# Patient Record
Sex: Female | Born: 1985 | Hispanic: Yes | Marital: Married | Smoking: Former smoker
Health system: Southern US, Community
[De-identification: ages and names within clinical notes are randomized; demographics above are authoritative.]

## PROBLEM LIST (undated history)

## (undated) ENCOUNTER — Inpatient Hospital Stay (HOSPITAL_COMMUNITY): Payer: Self-pay

## (undated) DIAGNOSIS — O4593 Premature separation of placenta, unspecified, third trimester: Secondary | ICD-10-CM

## (undated) DIAGNOSIS — D649 Anemia, unspecified: Secondary | ICD-10-CM

## (undated) DIAGNOSIS — Z9289 Personal history of other medical treatment: Secondary | ICD-10-CM

## (undated) DIAGNOSIS — O165 Unspecified maternal hypertension, complicating the puerperium: Secondary | ICD-10-CM

## (undated) DIAGNOSIS — O24919 Unspecified diabetes mellitus in pregnancy, unspecified trimester: Secondary | ICD-10-CM

## (undated) DIAGNOSIS — R739 Hyperglycemia, unspecified: Secondary | ICD-10-CM

## (undated) DIAGNOSIS — G43909 Migraine, unspecified, not intractable, without status migrainosus: Secondary | ICD-10-CM

## (undated) HISTORY — PX: NO PAST SURGERIES: SHX2092

## (undated) HISTORY — DX: Anemia, unspecified: D64.9

## (undated) HISTORY — DX: Unspecified maternal hypertension, complicating the puerperium: O16.5

## (undated) HISTORY — DX: Premature separation of placenta, unspecified, third trimester: O45.93

## (undated) HISTORY — DX: Unspecified diabetes mellitus in pregnancy, unspecified trimester: O24.919

---
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
## 2004-01-13 ENCOUNTER — Ambulatory Visit: Payer: Self-pay | Admitting: Sports Medicine

## 2004-04-13 ENCOUNTER — Ambulatory Visit: Payer: Self-pay | Admitting: Sports Medicine

## 2004-08-13 ENCOUNTER — Ambulatory Visit: Payer: Self-pay | Admitting: Family Medicine

## 2005-02-09 ENCOUNTER — Ambulatory Visit: Payer: Self-pay | Admitting: Family Medicine

## 2005-07-28 ENCOUNTER — Ambulatory Visit: Payer: Self-pay | Admitting: Family Medicine

## 2005-08-17 ENCOUNTER — Emergency Department (HOSPITAL_COMMUNITY): Admission: EM | Admit: 2005-08-17 | Discharge: 2005-08-17 | Payer: Self-pay | Admitting: Emergency Medicine

## 2006-04-27 DIAGNOSIS — G43909 Migraine, unspecified, not intractable, without status migrainosus: Secondary | ICD-10-CM | POA: Insufficient documentation

## 2006-04-27 DIAGNOSIS — D649 Anemia, unspecified: Secondary | ICD-10-CM

## 2006-04-28 ENCOUNTER — Encounter (INDEPENDENT_AMBULATORY_CARE_PROVIDER_SITE_OTHER): Payer: Self-pay | Admitting: *Deleted

## 2007-11-26 ENCOUNTER — Ambulatory Visit: Payer: Self-pay | Admitting: Family Medicine

## 2007-11-26 ENCOUNTER — Encounter: Payer: Self-pay | Admitting: *Deleted

## 2007-12-16 ENCOUNTER — Emergency Department (HOSPITAL_COMMUNITY): Admission: EM | Admit: 2007-12-16 | Discharge: 2007-12-16 | Payer: Self-pay | Admitting: Family Medicine

## 2008-08-18 ENCOUNTER — Telehealth: Payer: Self-pay | Admitting: *Deleted

## 2010-02-28 NOTE — L&D Delivery Note (Signed)
Delivery Note At 1:44 AM a viable female was delivered via Vaginal, Spontaneous Delivery (Presentation: Left Occiput Anterior).  APGAR:8 ,9 ; weight 3845g .   Placenta status: Intact, Spontaneous.  Cord: 3 vessels with the following complications: None.  Cord pH: n/a  Anesthesia: None  Episiotomy: None Lacerations: 1st degree labial Est. Blood Loss (mL): 300  Mom to postpartum.  Baby to nursery-stable.  Drucie Ip 01/19/2011, 2:06 AM

## 2010-03-15 ENCOUNTER — Emergency Department (HOSPITAL_COMMUNITY)
Admission: EM | Admit: 2010-03-15 | Discharge: 2010-03-15 | Payer: Self-pay | Source: Home / Self Care | Admitting: Emergency Medicine

## 2010-03-23 ENCOUNTER — Ambulatory Visit: Admit: 2010-03-23 | Payer: Self-pay

## 2010-04-12 ENCOUNTER — Encounter: Payer: Self-pay | Admitting: Family Medicine

## 2010-04-12 ENCOUNTER — Ambulatory Visit (INDEPENDENT_AMBULATORY_CARE_PROVIDER_SITE_OTHER): Payer: Self-pay | Admitting: Family Medicine

## 2010-04-12 ENCOUNTER — Other Ambulatory Visit: Payer: Self-pay | Admitting: Family Medicine

## 2010-04-12 VITALS — BP 120/90 | HR 68 | Temp 97.9°F | Wt 260.7 lb

## 2010-04-12 DIAGNOSIS — Z124 Encounter for screening for malignant neoplasm of cervix: Secondary | ICD-10-CM

## 2010-04-12 DIAGNOSIS — N760 Acute vaginitis: Secondary | ICD-10-CM

## 2010-04-12 DIAGNOSIS — Z319 Encounter for procreative management, unspecified: Secondary | ICD-10-CM

## 2010-04-12 DIAGNOSIS — E669 Obesity, unspecified: Secondary | ICD-10-CM

## 2010-04-12 LAB — LDL CHOLESTEROL, DIRECT: Direct LDL: 129 mg/dL — ABNORMAL HIGH

## 2010-04-12 NOTE — Assessment & Plan Note (Signed)
Pap done

## 2010-04-12 NOTE — Patient Instructions (Addendum)
Nice to meet you I want to get some labs today to see if there is any reason for why you are having trouble getting pregnant. I am so glad you stopped smoking keep it up Other things that may help would be weight loss, stopping alcohol and even caffeine I wan to see you again in 2 months if you still are having trouble Start a prenatal vitamin.  I will call you with the lab results.

## 2010-04-12 NOTE — Progress Notes (Signed)
  Subjective:    Patient ID: Kathy Sandoval, female    DOB: 1985-11-16, 25 y.o.   MRN: [id number]  HPI Pt is here for physical exam only problem is   1.  Infertility-  Pt states she had Implanon removed about 1 year ago and had her first period one month after she had it removed.  Pt states her period has been normal during this time every 28 days and usually last 4-5 days.  Pt denies any pain or any change in periods or in between periods. Pt has been pregnant before and her daughter is now 70 yo no D&C.  2.  Fatigue-  states she has been more tired recently no hair loss no swelling no weight changes but states that her mother was recently diagnosed with hyperthyroid and would like to know if she is at risk.   3.  Obesity-  Pt states she is happy with her side and has not made any recent attempt to lose weight.  Pt though states that if weight loss helps with her infertility then would consider doing it.   Review of Systems Denies shortness of breath dyspnea on exertion dysuria polydipsia or chest pain.     Objective:   Physical Exam     General Appearance:    Alert, cooperative, no distress, appears stated age obese  Head:    Normocephalic, without obvious abnormality, atraumatic  Eyes:    PERRL, conjunctiva/corneas clear, EOM's intact,   Ears:    Normal TM's and external ear canals, both ears  Nose:   Nares normal, septum midline, mucosa normal, no drainage    or sinus tenderness  Throat:   Lips, mucosa, and tongue normal; teeth and gums normal  Neck:   Supple, symmetrical, trachea midline, no adenopathy;    thyroid:  no enlargement/tenderness/nodules; no carotid   bruit      Lungs:     Clear to auscultation bilaterally, respirations unlabored  Chest Wall:    No tenderness or deformity   Heart:    Regular rate and rhythm, S1 and S2 normal, no murmur, rub   or gallop     Abdomen:     Soft, non-tender, bowel sounds active all four quadrants,    no masses, no organomegaly     GU  No external lesions internally cervix bleeds easily STD panel collected, pap collected normal bimanual exam.   Extremities:   Extremities normal, atraumatic, no cyanosis or edema  Pulses:   2+ and symmetric all extremities  Skin:   Skin color, texture, turgor normal, no rashes or lesions     Neurologic:   CNII-XII intact, normal strength,        Assessment & Plan:

## 2010-04-12 NOTE — Assessment & Plan Note (Signed)
Pt will attempt to lose weight.

## 2010-04-12 NOTE — Assessment & Plan Note (Signed)
Pt has had a kid before and with same partner, no D&C, having regular periods, pt is obese which could be a factor, will get some labs such as TSH that could be a potential problem.  Will have pt try for the next 2 months to make it one year since Implanon.  If still having trouble will send to infertility clinic.  Pt also given info that stop drinking and caffeine may help.  This is assesment

## 2010-04-13 LAB — COMPREHENSIVE METABOLIC PANEL
ALT: 17 U/L (ref 0–35)
AST: 21 U/L (ref 0–37)
Albumin: 4.2 g/dL (ref 3.5–5.2)
Alkaline Phosphatase: 54 U/L (ref 39–117)
BUN: 13 mg/dL (ref 6–23)
CO2: 22 mEq/L (ref 19–32)
Calcium: 9.1 mg/dL (ref 8.4–10.5)
Chloride: 107 mEq/L (ref 96–112)
Creat: 0.63 mg/dL (ref 0.40–1.20)
Glucose, Bld: 111 mg/dL — ABNORMAL HIGH (ref 70–99)
Potassium: 4.4 mEq/L (ref 3.5–5.3)
Sodium: 141 mEq/L (ref 135–145)
Total Bilirubin: 0.3 mg/dL (ref 0.3–1.2)
Total Protein: 7 g/dL (ref 6.0–8.3)

## 2010-04-13 LAB — GC/CHLAMYDIA PROBE AMP, GENITAL
Chlamydia, DNA Probe: NEGATIVE
GC Probe Amp, Genital: NEGATIVE

## 2010-04-13 LAB — TSH: TSH: 2.066 u[IU]/mL (ref 0.350–4.500)

## 2010-05-25 ENCOUNTER — Ambulatory Visit (INDEPENDENT_AMBULATORY_CARE_PROVIDER_SITE_OTHER): Payer: Self-pay | Admitting: Family Medicine

## 2010-05-25 ENCOUNTER — Encounter: Payer: Self-pay | Admitting: Family Medicine

## 2010-05-25 VITALS — BP 136/96 | HR 92 | Temp 98.2°F | Ht 66.0 in | Wt 260.9 lb

## 2010-05-25 DIAGNOSIS — Z331 Pregnant state, incidental: Secondary | ICD-10-CM | POA: Insufficient documentation

## 2010-05-25 DIAGNOSIS — N912 Amenorrhea, unspecified: Secondary | ICD-10-CM

## 2010-05-25 LAB — POCT URINE PREGNANCY: Preg Test, Ur: POSITIVE

## 2010-05-25 NOTE — Assessment & Plan Note (Signed)
Discussed importance of awareness of eating whole foods and eliminating process and high sugar foods.  She is already morbidly obese and heading into pregnancy.  Recommended eating like her ancestors did [place] Will set up an early 1 hour GTT early with prenatal labs.

## 2010-05-25 NOTE — Patient Instructions (Addendum)
Return Thrusday 3/29 for pre-natal labs and early one hour GTT, patient must have first am apt needs to be at work at [employer] pre-[facility] apt with Dr. Katrinka Blazing Begin prenatal vitamins, if you cannot afford begin a least taking folic acid 1 mg until Medicaid is active

## 2010-05-25 NOTE — Progress Notes (Signed)
  Subjective:    Patient ID: Kathy Sandoval, female    DOB: 1985-08-10, 25 y.o.   MRN: [id number]  HPI LMP 04/07/10, believes that she is pregnant.  Implanon removed one year ago.  Reports uncomplicated pregnancy 8 years ago.      Review of Systems  All other systems reviewed and are negative.       Objective:   Physical Exam  Constitutional:       Obese          Assessment & Plan:

## 2010-05-27 ENCOUNTER — Other Ambulatory Visit: Payer: Self-pay

## 2010-05-27 NOTE — Progress Notes (Signed)
Pt came for new OB labs.  She only has a "[employer]" card so she needs to go to [facility] before having OB labs done.  Gave her the phone number to call to set up an appointment. Dewitt Hoes, MLS

## 2010-06-15 ENCOUNTER — Encounter: Payer: Self-pay | Admitting: Family Medicine

## 2010-06-15 ENCOUNTER — Ambulatory Visit (INDEPENDENT_AMBULATORY_CARE_PROVIDER_SITE_OTHER): Payer: Self-pay | Admitting: Family Medicine

## 2010-06-15 ENCOUNTER — Other Ambulatory Visit: Payer: Self-pay | Admitting: Family Medicine

## 2010-06-15 VITALS — BP 146/82 | Wt 260.6 lb

## 2010-06-15 DIAGNOSIS — Z3682 Encounter for antenatal screening for nuchal translucency: Secondary | ICD-10-CM

## 2010-06-15 DIAGNOSIS — Z331 Pregnant state, incidental: Secondary | ICD-10-CM

## 2010-06-15 DIAGNOSIS — Z3480 Encounter for supervision of other normal pregnancy, unspecified trimester: Secondary | ICD-10-CM

## 2010-06-15 MED ORDER — PYRIDOXINE HCL 100 MG PO TABS: 100.0000 mg | ORAL_TABLET | Freq: Every day | ORAL | Status: DC

## 2010-06-15 NOTE — Patient Instructions (Signed)
Good to see you We will check your sugar today I will get labs today as well I am scheduling you to get your blood draw and ultrasound in 2 weeks.  Try Vitamin B6 for your nausea I think it will help Keep taking your prenatal vitamin.  I want to see you in 4 weeks.

## 2010-06-15 NOTE — Progress Notes (Signed)
Pt is doing very well, did have some nausea but otherwise feeling well.   PE exam unremarkable, RRR no murmur, obese uterus palpated in pelvis. No swelling Will get ealry glucola due to obesity Will draw blood work today Chlamydia and GC negative Normal pap. Will have integrated screen in 2 weeks Will see again in 4 weeks

## 2010-06-16 ENCOUNTER — Other Ambulatory Visit: Payer: Self-pay | Admitting: Family Medicine

## 2010-06-16 ENCOUNTER — Other Ambulatory Visit: Payer: Self-pay

## 2010-06-16 ENCOUNTER — Ambulatory Visit (HOSPITAL_COMMUNITY)
Admission: RE | Admit: 2010-06-16 | Discharge: 2010-06-16 | Disposition: A | Discharge status: Home / Self Care | Payer: Medicaid Other | Source: Ambulatory Visit | Attending: Family Medicine | Admitting: Family Medicine

## 2010-06-16 DIAGNOSIS — Z3689 Encounter for other specified antenatal screening: Secondary | ICD-10-CM | POA: Insufficient documentation

## 2010-06-16 DIAGNOSIS — Z331 Pregnant state, incidental: Secondary | ICD-10-CM

## 2010-06-16 LAB — GLUCOSE, CAPILLARY: Glucose-Capillary: 105 mg/dL — ABNORMAL HIGH (ref 70–99)

## 2010-06-16 LAB — US OB COMP LESS 14 WKS

## 2010-06-16 NOTE — Progress Notes (Signed)
Prenatal labs and 3 hr gtt Kathy Sandoval

## 2010-06-17 LAB — OBSTETRIC PANEL
Basophils Absolute: 0 10*3/uL (ref 0.0–0.1)
HCT: 39.1 % (ref 36.0–46.0)
Hepatitis B Surface Ag: NEGATIVE
Lymphocytes Relative: 21 % (ref 12–46)
Monocytes Absolute: 0.5 10*3/uL (ref 0.1–1.0)
Neutro Abs: 7.6 10*3/uL (ref 1.7–7.7)
Platelets: 328 10*3/uL (ref 150–400)
RDW: 14.1 % (ref 11.5–15.5)
Rubella: 13.6 IU/mL — ABNORMAL HIGH
WBC: 10.4 10*3/uL (ref 4.0–10.5)

## 2010-06-17 LAB — GLUCOSE TOLERANCE, 3 HOURS: Glucose Tolerance, 1 hour: 127 mg/dL (ref 70–189)

## 2010-06-18 LAB — CULTURE, OB URINE

## 2010-06-21 ENCOUNTER — Encounter: Payer: Self-pay | Admitting: Family Medicine

## 2010-06-21 DIAGNOSIS — Z22330 Carrier of Group B streptococcus: Secondary | ICD-10-CM | POA: Insufficient documentation

## 2010-07-01 ENCOUNTER — Encounter: Payer: Self-pay | Admitting: Family Medicine

## 2010-07-06 ENCOUNTER — Other Ambulatory Visit (HOSPITAL_COMMUNITY): Payer: Self-pay

## 2010-07-06 ENCOUNTER — Ambulatory Visit (HOSPITAL_COMMUNITY): Payer: Self-pay

## 2010-07-16 NOTE — Discharge Summary (Signed)
Kathy Sandoval, DAVISSON                         ACCOUNT NO.:  [id number]   MEDICAL RECORD NO.:  [id number]                   PATIENT TYPE:  INP   LOCATION:  9145                                 FACILITY:  [facility]   PHYSICIAN:  Phil D. Okey Dupre, M.D.                  DATE OF BIRTH:  1985-07-29   DATE OF ADMISSION:  05/13/2002  DATE OF DISCHARGE:  05/16/2002                                 DISCHARGE SUMMARY   DISCHARGE DIAGNOSES:  1. Term pregnancy.  2. Normal spontaneous vaginal delivery productive for a viable female with     Apgars of 9 and 9.  3. Anemia secondary to postpartum bleeding.   CONSULTATIONS:  None.   PROCEDURES:  None.   OBJECTIVE DATA:  1. Pelvic ultrasound postpartum was performed on May 15, 2002 to evaluate     abdominal pain.  Results showed enlarged postpartum uterus.  There is a     small amount of fluid in the endometrial canal but other evidence for     retained products of conception.  No adnexal masses seen.  2. Hemoglobin on May 16, 2002 was 7.3 with a hematocrit of 20.7.  This was     decreased from an admission level of 12.8 and 38 respectively.  3. PIH labs postpartum showed an LDH of 132, uric acid 4.3, sodium 133,     potassium 3.9, chloride 107, CO2 26, glucose 94, BUN 13, creatinine 0.6,     total bilirubin 0.1, alkaline phosphatase 92, SGOT 13, SGPT less than 19,     total protein 4.5, albumin 1.9, calcium 8.1.  4. PRP was nonreactive.  5. Prenatal labs showed a blood type of O positive, negative antibody     screen, rubella immune, hepatitis B negative, HIV negative, gonorrhea and     chlamydia negative, GBS negative.   HOSPITAL COURSE:  This is a 25 year old G1 now P1-0-0-1 who presented to  [facility] in labor.  On presentation the patient did have some  variable decelerations with a late component down to 60 beats per minute.  This was treated with putting the patient in the left lateral decubitus  position, oxygen, amnioinfusion.  The  patient was artificially ruptured  which showed lightly stained meconium fluid.  The fetal heart rate  subsequently stabilized into the normal range with good reactivity.  The  patient progressed under IV pain medication to full dilation.  During the  delivery the infant was found to have a loose nuchal and body cord x1.  The  baby was suctioned on the perineum with a DeLee suction.  The infant was  handed to the awaiting NICU team which did not intubate her, as the baby had  good spontaneous cry.  An intact placenta with a three-vessel cord was  delivered without complication.  Estimated blood loss was less than 500 mL.  There was only a small superficial perineal  laceration without repair.  Apgars were 9 and 9 with a birth weight of  6 pounds 4 ounces.  The only postpartum complication was some postpartum  bleeding estimated at around 300-400 mL.  She was treated with Methergine as  well as an extra round of Pitocin.  Hemoglobin stabilized to a discharge  level of 7.  Bleeding decreased to a minimum at discharge.  The patient's  vital signs remained stable.  The patient plans to breast and bottle feed.  She received a Depo-Provera shot for contraception prior to discharge.  A  pelvic ultrasound was obtained with the results above for right upper  quadrant pain which was stable, and observed for 23 hours prior to  discharge.  She will be discharged to home with follow up in six weeks or  sooner if pain worsens at the [facility].   DISCHARGE MEDICATIONS:  1. Ibuprofen 600 mg q.6h. p.r.n.  2. Tylenol 325 mg q.6h. as needed.  3. Depo-Provera subcu every three months.  4. Ferrous sulfate 325 mg b.i.d.   WOUND CARE:  Not applicable.   DIET:  High protein high iron diet.   ACTIVITY:  Per booklet.  No heavy lifting for six weeks.   SEXUAL ACTIVITY:  Nothing in vaginal until follow-up.   FOLLOW-UP:  She is to make an appointment to see the [facility]   in six weeks.  The phone number is [phone].  She should follow up sooner if  she has increasing right upper quadrant abdominal pain, headache, blurry  vision, further vaginal bleeding.     Emmit Alexanders, M.D.                            Phil D. Okey Dupre, M.D.    DV/MEDQ  D:  05/16/2002  T:  05/16/2002  Job:  [id number]

## 2010-07-23 ENCOUNTER — Ambulatory Visit (INDEPENDENT_AMBULATORY_CARE_PROVIDER_SITE_OTHER): Payer: Medicaid Other | Admitting: Family Medicine

## 2010-07-23 DIAGNOSIS — Z3480 Encounter for supervision of other normal pregnancy, unspecified trimester: Secondary | ICD-10-CM

## 2010-07-23 MED ORDER — PYRIDOXINE HCL 100 MG PO TABS: 200.0000 mg | ORAL_TABLET | Freq: Every day | ORAL | Status: DC

## 2010-07-23 NOTE — Progress Notes (Signed)
Pt doing well nausea improved still mild PE unremarkable.  GBS in urine will need ppx abx. Passed three hour glucola Normal pap U/s puts EDC at 11/27 but pt has sure LMP will continue with current dating.  Will get 20 week u/s for anatomy and growth Will get labs at that time as well.  Discussed weight and that it is fine to lose mild weight with where pt started with her weight.

## 2010-07-23 NOTE — Patient Instructions (Signed)
You are doing great Increase the B6 to 200mg  daily for your nausea I want to see you again in 4 weeks.

## 2010-08-20 ENCOUNTER — Ambulatory Visit: Payer: Medicaid Other | Admitting: Family Medicine

## 2010-08-20 VITALS — BP 123/79 | Temp 97.9°F | Wt 262.0 lb

## 2010-08-20 DIAGNOSIS — Z331 Pregnant state, incidental: Secondary | ICD-10-CM

## 2010-08-20 NOTE — Patient Instructions (Addendum)
You are doing great and baby sounds great  I want you to com back and see OB clinic in the next 4-6 weeks After that we will get labs and will do the glucose test again (sorry) I will see you after your next appointment.   You have been scheduled for an appointment at [facility] for your ultrasound on June 26th at 9am.

## 2010-08-20 NOTE — Progress Notes (Signed)
Pt states the nausea has improved no longer need the B6.   PE unremarkable.  FOB in room is excited no english only spanish for FOB.  GBS in urine will need ppx abx.  Passed three hour glucola will need again at 24-28 weeks.  Normal pap  U/s puts EDC at 11/27 but pt has sure LMP will continue with current dating.  Will get 20 week u/s for anatomy and growth  Will get labs at that time as well.  Discussed weight and that it is fine to lose mild weight with where pt started with her weight.  Will f/u in 4-6 weeks with OB clinic.

## 2010-08-24 ENCOUNTER — Ambulatory Visit (HOSPITAL_COMMUNITY)
Admission: RE | Admit: 2010-08-24 | Discharge: 2010-08-24 | Disposition: A | Discharge status: Home / Self Care | Payer: Medicaid Other | Source: Ambulatory Visit | Attending: Family Medicine | Admitting: Family Medicine

## 2010-08-24 DIAGNOSIS — O99210 Obesity complicating pregnancy, unspecified trimester: Secondary | ICD-10-CM | POA: Insufficient documentation

## 2010-08-24 DIAGNOSIS — O358XX0 Maternal care for other (suspected) fetal abnormality and damage, not applicable or unspecified: Secondary | ICD-10-CM | POA: Insufficient documentation

## 2010-08-24 DIAGNOSIS — Z363 Encounter for antenatal screening for malformations: Secondary | ICD-10-CM | POA: Insufficient documentation

## 2010-08-24 DIAGNOSIS — Z331 Pregnant state, incidental: Secondary | ICD-10-CM

## 2010-08-24 DIAGNOSIS — Z1389 Encounter for screening for other disorder: Secondary | ICD-10-CM | POA: Insufficient documentation

## 2010-08-24 DIAGNOSIS — E669 Obesity, unspecified: Secondary | ICD-10-CM | POA: Insufficient documentation

## 2010-08-30 ENCOUNTER — Telehealth: Payer: Self-pay | Admitting: Family Medicine

## 2010-08-30 ENCOUNTER — Other Ambulatory Visit: Payer: Self-pay | Admitting: Family Medicine

## 2010-08-30 DIAGNOSIS — Z331 Pregnant state, incidental: Secondary | ICD-10-CM

## 2010-08-30 NOTE — Telephone Encounter (Signed)
Follow up ultrasound scheduled at [facility] for 09/13/10 at 2:30pm, tried calling to inform patient but unable to leave message, will try again later.

## 2010-08-30 NOTE — Telephone Encounter (Signed)
Had [place] last week and was told she needed to make f/u US appt before next OB appt  On 7/12

## 2010-08-30 NOTE — Telephone Encounter (Signed)
Pt returned call and was given message.

## 2010-08-30 NOTE — Telephone Encounter (Signed)
Agree have a reminder for myself to make appointment.  Would do it though next week after the 9th.  Will put the referral in and will forward this message to staff to help schedule the ultrasound.  The reason to have another scan is for revaluation of growth and anatomy, certain structures were not seen.  Also can do integrated screen portion.  Please schedule and call pt with date.

## 2010-08-30 NOTE — Telephone Encounter (Signed)
Will forward to MD for order.

## 2010-09-09 ENCOUNTER — Encounter: Payer: Self-pay | Admitting: Family Medicine

## 2010-09-09 ENCOUNTER — Ambulatory Visit (INDEPENDENT_AMBULATORY_CARE_PROVIDER_SITE_OTHER): Payer: Medicaid Other | Admitting: Family Medicine

## 2010-09-09 VITALS — BP 122/72 | Temp 97.6°F | Wt 263.3 lb

## 2010-09-09 DIAGNOSIS — Z3480 Encounter for supervision of other normal pregnancy, unspecified trimester: Secondary | ICD-10-CM

## 2010-09-09 DIAGNOSIS — Z331 Pregnant state, incidental: Secondary | ICD-10-CM

## 2010-09-09 NOTE — Patient Instructions (Signed)
It was a pleasure to see you today in [facility].  You will have the repeat 1hr glucose test at 26 to 28 weeks (after your next visit with Dr Katrinka Blazing in 4 weeks).  Please make an OB follow up appointment with Dr Katrinka Blazing in 4 more weeks.   Preterm Labor, Home Care  Preterm labor is defined as having uterine contractions that cause the cervix to open (dilate), shorten and thin (effacement) before completing 37 weeks of pregnancy. Preterm labor accounts for most hospital admissions in pregnant women.  CAUSES  Most cases of preterm labor are unknown.   Small areas of separation of the placenta (abruption).   Excess fluid in the amniotic sac (poly hydramnios).   Twins or more.   The cervix cannot hold the baby because the tissue in the cervix is too weak (incompetent cervix).   Hormone changes.   Vaginal bleeding in more than one of the trimesters.   Infection of the cervix, vagina or bladder.   Smoking.   Antiphosolipid Syndrome. This happens when antibodies affect the protein in the body.  DIAGNOSIS Factors that help predict preterm labor:  History of preterm labor with a past pregnancy.   Bacterial vaginosis in women who previously had preterm labor.   Home uterine activity monitoring that show uterine contractions.   Fetal fibronectin protein that is elevated in women with previous history of preterm labor.   Ultrasound to measure the length of the cervix, if it shows signs of shortening before the due date, it may be a sign of preterm labor.   Using the fibronectin and cervical ultrasound evaluation together is more predictive of impending preterm labor.   Other risk factors include:   Nonwhite race.   Pregnancy in a 55 year old or younger.   Pregnancy in a 71 year old or older.   Low socioeconomic factors.   Low weight gain during the pregnancy.  PREVENTION Not all preterm labor can be prevented. Some early contractions can be prevented with simple measures.  Drink  fluids. Drink eight, 8 ounce glasses of fluids per day. Preterm labor rates go up in the summer months. Dehydration makes the blood volume decrease. This increases the concentration of oxytocin (hormone that causes uterine contractions) in the blood. Hydrating yourself helps prevent this build up.   Watch for signs of infection. Signs include burning during urination, increased need to urinate, abnormal vaginal discharge or unexplained fevers.   Keep your appointments with your caregiver. Call your caregiver right away if you think you are having uterine contractions.   Seek medical advice with questions or problems. It is much better to ask questions of your caregiver than to be in untreated preterm labor unknowingly.  MANAGEMENT OF PRETERM LABOR, IN & OUT OF THE HOSPITAL There are a lot of things to manage in preterm labor. These things include both medical measures and personal care measures for you and/or your baby. Most preterm labor will be handled in the hospital. Things that may be helpful in preterm labor include:  Hydration (oral or IV). Take in eight, 8 ounce glasses of water per day.   Bed rest (home or hospital). Lying on your left side may help.   Avoid intercourse and orgasms.   Medication (antibiotics) to help prevent infection. This is more likely if your membranes have ruptured or if the contractions are caused by infection. Take medications as directed.   Evaluation of your baby. These tests or procedures help the caregiver know how  the baby is doing and may do in the case of an early birth. Including:   Biophysical profile.   Non-stress or stress tests.   Amniocentesis to evaluate the baby for fetal lung maturity.   Amniotic fluid volume index (AFI).   An ultrasound.   Medications (steroids) to help your baby's lungs mature more quickly may be used. This may happen if preterm birth cannot be stopped.   Tocolytic medications (medications that help stop uterine  contractions) may help prolong the pregnancy up to 7 days. This is helpful if steroids medication is needed to help the baby's lungs mature.   Your caregiver may give other advice on preparation for preterm birth.   Progesterone may be beneficial in some cases of preterm labor.  TREATMENT The best treatment is prevention, being aware of risk factors and early detection. Make sure to ask your caregiver to discuss with you the signs and symptoms of preterm labor, especially if you had preterm labor with a previous pregnancy. HOME CARE INSTRUCTIONS  Eat a balanced and nourished diet.   Take your vitamin supplements as directed.   Drink 6 to 8 glasses of liquids a day.   Get plenty of rest and sleep.   Do not have sexual relations if you have preterm labor or are at high risk of having preterm labor.   Follow your care giver's recommendation regarding activities, medications, blood and other tests (ultrasound, amniocentesis, etc.).   Avoid stress.   Avoid hard labor or prolonged exercise if you are at high risk for preterm labor.   Do not smoke.  SEEK IMMEDIATE MEDICAL CARE IF:  You are having contractions.   You have abdominal pain.   You have vaginal bleeding.   You have painful urination.   You have abnormal discharge.   You develop a temperature 102 F (38.9 C) or higher.  Document Released: 02/14/2005 Document Re-Released: 05/11/2009 [facility] Patient Information 2011 [place], [place].

## 2010-09-09 NOTE — Progress Notes (Signed)
Patient seen today in [facility], she is [redacted]w[redacted]d by early [place] (06/16/10, at [redacted]w[redacted]d, giving 7 day discrepancy from LMP).  No complaints to date.  No discharge.  Feels good fetal movement.  Had UCx with trace GBS, asymptomatic, agree with plan to consider GBS-positive, does not need screen at 36 weeks. WIll need repeat 1hrGTT between 26-[redacted] weeks EGA.  Continue PNVs.  Discussed preterm labor precautions. Follow up in 4 weeks with Dr Katrinka Blazing. CHANGED WORKING EDD BASED UPON EARLY U/S.

## 2010-09-13 ENCOUNTER — Ambulatory Visit (HOSPITAL_COMMUNITY)
Admission: RE | Admit: 2010-09-13 | Discharge: 2010-09-13 | Disposition: A | Discharge status: Home / Self Care | Payer: Medicaid Other | Source: Ambulatory Visit | Attending: Family Medicine | Admitting: Family Medicine

## 2010-09-13 DIAGNOSIS — Z331 Pregnant state, incidental: Secondary | ICD-10-CM

## 2010-09-13 DIAGNOSIS — Z3689 Encounter for other specified antenatal screening: Secondary | ICD-10-CM | POA: Insufficient documentation

## 2010-09-13 DIAGNOSIS — E669 Obesity, unspecified: Secondary | ICD-10-CM | POA: Insufficient documentation

## 2010-09-13 LAB — US OB FOLLOW UP

## 2010-10-15 ENCOUNTER — Encounter: Payer: Medicaid Other | Admitting: Family Medicine

## 2010-10-21 ENCOUNTER — Ambulatory Visit (INDEPENDENT_AMBULATORY_CARE_PROVIDER_SITE_OTHER): Payer: Medicaid Other | Admitting: Family Medicine

## 2010-10-21 VITALS — BP 114/78 | Wt 267.0 lb

## 2010-10-21 DIAGNOSIS — Z331 Pregnant state, incidental: Secondary | ICD-10-CM

## 2010-10-21 DIAGNOSIS — Z3480 Encounter for supervision of other normal pregnancy, unspecified trimester: Secondary | ICD-10-CM

## 2010-10-21 LAB — GLUCOSE, CAPILLARY: Glucose-Capillary: 226 mg/dL — ABNORMAL HIGH (ref 70–99)

## 2010-10-21 MED ORDER — HYDROCORTISONE-ACETIC ACID 1-2 % OT SOLN: 3.0000 [drp] | Freq: Two times a day (BID) | OTIC | Status: AC

## 2010-10-21 NOTE — Progress Notes (Addendum)
Pt today is 26.2 weeks based on first trimester u/s.  Has right ear pain for a couple days, hurts to move ear, no discharge no trouble hearing. Pt denies contractions, +movement.  Urine culture _ GBS will need px.  1 hour glucola collected today.   Physical exam consistent with external otitis will do acetic acid drops. Continue PNV, talked about kick counts and preterm labor, Will do implonon for contraceptive afterward, breastfeeding.  Baby girl name Lynnae Prude. See again in 4 weeks and will need labs.  PT FAILED 1 HOUR GLUCOLA 226.  WILL SEND TO HIGH RISK.

## 2010-10-21 NOTE — Patient Instructions (Signed)
I sent you in ear drops. You can use them twice a day and are safe for the baby.    Kick Count Fetal Movement Counts Patient Name:  Patient Due Date: 01/18/11 Kick counts is highly recommended in high risk pregnancies, but it is a good idea for every pregnant woman to do. Start counting fetal movements at 28 weeks of the pregnancy. Fetal movements increase after eating a full meal or eating or drinking something sweet (the blood sugar is higher). It is also important to drink plenty of fluids (well hydrated) before doing the count. Lie on your left side because it helps with the circulation or you can sit in a comfortable chair with your arms over your belly (abdomen) with no distractions around you. DOING THE COUNT:  Try to do the count the same time of day each time you do it.   Mark the day and time, then see how long it takes for you to feel 10 movements (kicks, flutters, swishes, rolls). You should have at least 10 movements within 2 hours. You will most likely feel 10 movements in much less than 2 hours. If you do not, wait an hour and count again. After a couple of days you will see a pattern.   What you are looking for is a change in the pattern or not enough counts in 2 hours. Is it taking longer in time to reach 10 movements?  SEEK MEDICAL CARE IF:  You feel less than 10 counts in 2 hours. Tried twice.   No movement in one hour.   The pattern is changing or taking longer each day to reach 10 counts in 2 hours.   You feel the baby is not moving as it usually does.   Date  Movements Start time Doreatha Martin time  Date  Movements Start time [place]  time    Sun      Sun     W  JYN    W  Mon     E  Tue    E  Tue     E  Wed    E  Wed     K  Thur    K  Thur       Fri      Fri       WGN      Sat       Sun      Sun     W  Mon    W  Mon     E  Tue    E  Tue     E  Wed    E  Wed     K  Thur    K  Clovis Cao       Fri      Fri       Sat      Sat       Sun      Sun     W  Mon    W  Mon       E  Tue    E  Tue     E  Wed    E  Wed     K  Clovis Cao    K  Clovis Cao       Fri      Fri       Sat      Sat       Wynelle Link  Sun     W  Mon    W  Mon     E  Tue    E  Tue     E  Wed    E  Wed     K  Willaim Rayas       Fri      Fri       ZOX      Sat     Document Released: 03/16/2006 Document Re-Released: 08/04/2009 [facility] Patient Information 2011 [place], [place].

## 2010-10-21 NOTE — Progress Notes (Signed)
Addended by: Judi Saa on: 10/21/2010 11:12 AM   Modules accepted: Orders

## 2010-10-26 ENCOUNTER — Telehealth: Payer: Self-pay | Admitting: Family Medicine

## 2010-10-26 NOTE — Telephone Encounter (Signed)
Pt given info from appt schedule [place], Maryjo [place]

## 2010-10-26 NOTE — Telephone Encounter (Signed)
Is calling to find out when her appt is going to be with [facility] - due to results of Glucose test.

## 2010-11-08 ENCOUNTER — Encounter: Payer: Self-pay | Admitting: Obstetrics & Gynecology

## 2010-11-08 ENCOUNTER — Ambulatory Visit (INDEPENDENT_AMBULATORY_CARE_PROVIDER_SITE_OTHER): Payer: Medicaid Other | Admitting: Obstetrics & Gynecology

## 2010-11-08 ENCOUNTER — Encounter: Payer: Medicaid Other | Attending: Family Medicine | Admitting: Dietician

## 2010-11-08 VITALS — BP 130/83 | Temp 97.9°F | Wt 263.3 lb

## 2010-11-08 DIAGNOSIS — Z713 Dietary counseling and surveillance: Secondary | ICD-10-CM | POA: Insufficient documentation

## 2010-11-08 DIAGNOSIS — O99810 Abnormal glucose complicating pregnancy: Secondary | ICD-10-CM | POA: Insufficient documentation

## 2010-11-08 DIAGNOSIS — O24919 Unspecified diabetes mellitus in pregnancy, unspecified trimester: Secondary | ICD-10-CM

## 2010-11-08 HISTORY — DX: Unspecified diabetes mellitus in pregnancy, unspecified trimester: O24.919

## 2010-11-08 LAB — POCT URINALYSIS DIP (DEVICE)
Glucose, UA: NEGATIVE mg/dL
Ketones, ur: 80 mg/dL — AB
Protein, ur: 30 mg/dL — AB
Specific Gravity, Urine: >=1.03 (ref 1.005–1.030)

## 2010-11-08 LAB — CBC
HCT: 36.4 % (ref 36.0–46.0)
Hemoglobin: 11.7 g/dL — ABNORMAL LOW (ref 12.0–15.0)
MCV: 88.8 fL (ref 78.0–100.0)
RBC: 4.1 MIL/uL (ref 3.87–5.11)
WBC: 11.8 10*3/uL — ABNORMAL HIGH (ref 4.0–10.5)

## 2010-11-08 MED ORDER — ACCU-CHEK FASTCLIX LANCET KIT: 1.0000 | PACK | Freq: Four times a day (QID) | Status: DC

## 2010-11-08 MED ORDER — ACCU-CHEK MULTICLIX LANCETS MISC: Status: DC

## 2010-11-08 MED ORDER — GLUCOSE BLOOD VI STRP: ORAL_STRIP | Status: DC

## 2010-11-08 NOTE — Progress Notes (Signed)
Transferred from Dr. Katrinka Blazing at [facility], 1hr glucose 226. Diabetes counseling, QID test

## 2010-11-08 NOTE — Progress Notes (Signed)
Addended by: Adam Phenix on: 11/08/2010 11:56 AM   Modules accepted: Orders

## 2010-11-08 NOTE — Progress Notes (Signed)
Pt passed early glucola, and early 3hr gtt. Failed 26w 1hr on 8/23. Needs 28 wk labs drawn.

## 2010-11-08 NOTE — Progress Notes (Signed)
Addended by: Adam Phenix on: 11/08/2010 11:16 AM   Modules accepted: Orders

## 2010-11-08 NOTE — Progress Notes (Signed)
Completed review of BGM and Meter instructions.  Provided Accu-Chek Nano meter.  [id number] Expiration: 01/28/2012.  To monitor fasting and 2 hours post-meal blood glucose.  Today on return demo, glucose= 90 3 hours post meal.  Instructed to bring meter and log book to each appointment.  Maggie Trayven Lumadue, RN, RD, CDE.

## 2010-11-08 NOTE — Progress Notes (Signed)
Nutrition Note:  Referred for 1st [facility] visit.  Dx. GDM, obesity.  Pt received WIC services.  Pt reports intake of 3 meals with no snacks, pt works 5 days/wk and finds it difficult to include snacks. Pt given GDM diet instruction, including importance of CHO/protein intake at each meal.  Pt agrees to eat 5-6 small frequent meals.  Will start to take appropriate snacks to work and will eat between meals.  Pt has good dietary habits already and should be able to follow GDM without any problems.  Current weight gain of 6# @ [redacted]w[redacted]d is perfect.   Follow up if referred Cy Blamer, RD

## 2010-11-09 ENCOUNTER — Other Ambulatory Visit: Payer: Self-pay | Admitting: Obstetrics & Gynecology

## 2010-11-10 LAB — PROTEIN, URINE, 24 HOUR: Protein, 24H Urine: 72 mg/d (ref 50–100)

## 2010-11-15 ENCOUNTER — Ambulatory Visit (INDEPENDENT_AMBULATORY_CARE_PROVIDER_SITE_OTHER): Payer: Medicaid Other | Admitting: Obstetrics and Gynecology

## 2010-11-15 VITALS — BP 127/84 | Temp 97.5°F | Wt 263.0 lb

## 2010-11-15 DIAGNOSIS — O24410 Gestational diabetes mellitus in pregnancy, diet controlled: Secondary | ICD-10-CM

## 2010-11-15 DIAGNOSIS — O99810 Abnormal glucose complicating pregnancy: Secondary | ICD-10-CM

## 2010-11-15 LAB — POCT URINALYSIS DIP (DEVICE)
Glucose, UA: 100 mg/dL — AB
Specific Gravity, Urine: >=1.03 (ref 1.005–1.030)

## 2010-11-15 NOTE — Patient Instructions (Signed)
Gestational Diabetes Mellitus (GDM) Gestational diabetes mellitus (GDM) is diabetes that occurs only during pregnancy. This happens when the body cannot properly handle the glucose (sugar) that increases in the blood after eating. During pregnancy, insulin resistance (reduced sensitivity to insulin) occurs because of the release of hormones from the placenta. Usually, the pancreas of pregnant women produces enough insulin to overcome the resistance that occurs. However, in gestational diabetes, the insulin is there but it does not work effectively. If the resistance is severe enough that the pancreas does not produce enough insulin, extra glucose builds up in the blood.  WHO IS AT RISK FOR DEVELOPING GESTATIONAL DIABETES?  Women with a history of diabetes in the family.   Women over age 25.   Women who are overweight.   Women in certain ethnic groups (Hispanic, African American, Native American, Asian and Pacific Islander).  WHAT CAN HAPPEN TO THE BABY? If the mother's blood glucose is too high while she is pregnant, the extra sugar will travel through the umbilical cord to the baby. Some of the problems the baby may have are:  Large Baby - If the baby receives too much sugar, the baby will gain more weight. This may cause the baby to be too large to be born normally (vaginally) and a Cesarean section (C-section) may be needed.   Low Blood Glucose (hypoglycemia) - The baby makes extra insulin, in response to the extra sugar its gets from its mother. When the baby is born and no longer needs this extra insulin, the baby's blood glucose level may drop.   Jaundice (yellow coloring of the skin and eyes) - This is fairly common in babies. It is caused from a build-up of the chemical called bilirubin. This is rarely serious, but is seen more often in babies whose mothers had gestational diabetes.  RISKS TO THE MOTHER Women who have had gestational diabetes may be at higher risk for some problems,  including:  Preeclampsia or toxemia, which includes problems with high blood pressure. Blood pressure and protein levels in the urine must be checked frequently.   Infections.   Cesarean section (C-section) for delivery.   Developing Type 2 diabetes later in life. About 30-50% will develop diabetes later, especially if obese.  DIAGNOSIS The hormones that cause insulin resistance are highest at about 24-28 weeks of pregnancy. If symptoms are experienced, they are much like symptoms you would normally expect during pregnancy.  GDM is often diagnosed using a two part method: 1. After 24-28 weeks of pregnancy, the woman drinks a glucose solution and takes a blood test. If the glucose level is high, a second test will be given.  2. Oral Glucose Tolerance Test (OGTT) which is 3 hours long - After not eating overnight, the blood glucose is checked. The woman drinks a glucose solution, and hourly blood glucose tests are taken.  If the woman has risk factors for GDM, the caregiver may test earlier than 24 weeks of pregnancy. TREATMENT Treatment of GDM is directed at keeping the mother's blood glucose level normal, and may include:  Meal planning.   Taking insulin or other medicine to control your blood glucose level.   Exercise.   Keeping a daily record of the foods you eat.   Blood glucose monitoring and keeping a record of your blood glucose levels.   May monitor ketone levels in the urine, although this is no longer considered necessary in most pregnancies.  HOME CARE INSTRUCTIONS While you are pregnant:  Follow your   caregiver's advice regarding your prenatal appointments, meal planning, exercise, medicines, vitamins, blood and other tests, and physical activities.   Keep a record of your meals, blood glucose tests, and the amount of insulin you are taking (if any). Show this to your caregiver at every prenatal visit.   If you have GDM, you may have problems with hypoglycemia (low blood  glucose). You may suspect this if you become suddenly dizzy, feel shaky, and/or weak. If you think this is happening and you have a glucose meter, try to test your blood glucose level. Follow your caregiver's advice for when and how to treat your low blood glucose. Generally, the 15:15 rule is followed: Treat by consuming 15 grams of carbohydrates, wait 15 minutes, and recheck blood glucose. Examples of 15 grams of carbohydrates are:   1 cup skim or low fat milk.    cup juice.   3-4 glucose tablets.   5-6 hard candies.   1 small box raisins.    cup regular soda pop.   Practice good hygiene, to avoid infections.   Do not smoke.  SEEK MEDICAL CARE IF:  You develop abnormal vaginal discharge, with or without itching.   You become weak and tired more than expected.   You seem to sweat a lot.   You have a sudden increase in weight, 5 pounds or more in one week.   You are losing weight, 3 pounds or more in a week.   Your blood glucose level is high, and you need instructions on what to do about it.  SEEK IMMEDIATE MEDICAL CARE IF:  You develop a severe headache.   You faint or pass out.   You develop nausea and vomiting.   You become disoriented or confused.   You have a convulsion.   You develop vision problems.   You develop stomach pain.   You develop vaginal bleeding.   You develop uterine contractions.   You have leaking or a gush of fluid from the vagina.  AFTER YOU HAVE THE BABY:  Go to all of your follow-up appointments, and have blood tests as advised by your caregiver.   Maintain a healthy lifestyle, to prevent diabetes in the future. This includes:   Following a healthy meal plan.   Controlling your weight.   Getting enough exercise and proper rest.   Do not smoke.   Breastfeed your baby if you can. This will lower the chance of you and your baby developing diabetes later in life.  For more information about diabetes, go to the American  Diabetes Association at: www.americandiabetesassociation.org. For more information about gestational diabetes, go to the American Congress of Obstetricians and Gynecologists at: www.acog.org. Document Released: 05/23/2000 Document Re-Released: 02/02/2009 [employer] Patient Information 2011 [employer]. 

## 2010-11-15 NOTE — Progress Notes (Signed)
Pulse 116. Pain down right leg. No vaginal discharge. Pt to see social worker today.

## 2010-11-15 NOTE — Progress Notes (Signed)
Pt was given an Lobbyist.  When she went to the [facility] on [address], they changed out her meter and gave her an [employer] and the appropriate strips for this meter.  She is an [facility]  Patient.  I am at a loss as how the pharmacy can change the meter.  Granted this meter is being phased out by [employer] and the strips are probably cheaper.  Meter is more complex for the patient.  She is aware that she needs to keep her glucose records and we will follow closely.  The staff here find it difficult to deal with.

## 2010-11-15 NOTE — Progress Notes (Signed)
Pt without any complaints. Reviewed importance of good CBG monitoring. Patient was not consistently checking fasting or 2hr postprandial and values are not concordant to what is in the meter. Risks of poorly controlled diabetes in pregnancy reviewed which included fetal macrosomia, shoulder dystocia, neonatal hypoglycemia and or seizures and potential for IUFD. Patient verbalized understanding. RTC in 1 week. FM/PTL precautions reviewed.

## 2010-11-22 ENCOUNTER — Ambulatory Visit (INDEPENDENT_AMBULATORY_CARE_PROVIDER_SITE_OTHER): Payer: Medicaid Other | Admitting: Physician Assistant

## 2010-11-22 ENCOUNTER — Encounter: Payer: Self-pay | Admitting: Physician Assistant

## 2010-11-22 ENCOUNTER — Other Ambulatory Visit: Payer: Self-pay | Admitting: Obstetrics & Gynecology

## 2010-11-22 VITALS — BP 120/82 | Temp 96.9°F | Wt 259.6 lb

## 2010-11-22 DIAGNOSIS — O24419 Gestational diabetes mellitus in pregnancy, unspecified control: Secondary | ICD-10-CM

## 2010-11-22 DIAGNOSIS — O24919 Unspecified diabetes mellitus in pregnancy, unspecified trimester: Secondary | ICD-10-CM

## 2010-11-22 LAB — POCT URINALYSIS DIP (DEVICE)
Bilirubin Urine: NEGATIVE
Glucose, UA: NEGATIVE mg/dL
Nitrite: NEGATIVE
Specific Gravity, Urine: 1.015 (ref 1.005–1.030)

## 2010-11-22 LAB — GLUCOSE, CAPILLARY: Glucose-Capillary: 97 mg/dL (ref 70–99)

## 2010-11-22 NOTE — Progress Notes (Signed)
Pt did not bring meter or log to clinic today. States FBS < 90 and 2 pp ~ 120-130. Discussed with pt again the importance of bringing meter and log to each and every visit. Nutrition and social work today. Will recheck BP prior to leaving clinic today. Denies pre-x s/s. Will obtain 24 hour urine and labs due to increased risk of pre-x. Growth FU [place] scheduled.

## 2010-11-22 NOTE — Progress Notes (Signed)
Nutrition Note: Follow visit with pt, reports following GDM diet, small freq meals.  Reports 4# weight loss since previous visit on (9/17).  Currently overall weight gain of 2.6# is inadequate, however no medical concerns with baby reported.   Pt will continue to follow current diet and will bring meter to next visit.  Follow up if referred. Cy Blamer, RD

## 2010-11-22 NOTE — Patient Instructions (Signed)
Diabetes Meal Planning Guide The diabetes meal planning guide is a tool to help you plan your meals and snacks. It is important for people with diabetes to manage their blood sugar levels. Choosing the right foods and the right amounts throughout your day will help control your blood sugar. Eating right can even help you improve your blood pressure and reach or maintain a healthy weight. CARBOHYDRATE COUNTING MADE EASY When you eat carbohydrates, they turn to sugar (glucose). This raises your blood sugar level. Counting carbohydrates can help you control this level so you feel better. When you plan your meals by counting carbohydrates, you can have more flexibility in what you eat and balance your medicine with your food intake. Carbohydrate counting simply means adding up the total amount of carbohydrate grams (g) in your meals or snacks. Try to eat about the same amount at each meal. Foods with carbohydrates are listed below. Each portion below is 1 carbohydrate serving or 15 grams of carbohydrates. Ask your dietician how many grams of carbohydrates you should eat at each meal or snack. Grains and Starches 1 slice bread 1/2 English muffin or hotdog/hamburger bun 3/4 cup cold cereal (unsweetened) 1/3 cup cooked pasta or rice 1/2 cup starchy vegetables (corn, potatoes, peas, beans, winter squash) 1 tortilla (6 inches) 1/4 bagel 1 waffle or pancake (size of a CD) 1/2 cup cooked cereal 4 to 6 small crackers *Whole grain is recommended Fruit 1 cup fresh unsweetened berries, melon, papaya, pineapple 1 small fresh fruit 1/2 banana or mango 1/2 cup fruit juice (4 ounces unsweetened) 1/2 cup canned fruit in natural juice or water 2 tablespoons dried fruit 12 to 15 grapes or cherries Milk and Yogurt 1 cup fat-free or 1% milk 1 cup soy milk 6 ounces light yogurt with sugar-free sweetener 6 ounces low-fat soy yogurt 6 ounces plain yogurt Vegetables 1 cup raw or 1/2 cup cooked is counted as 0  carbohydrates or a "free" food. If you eat 3 or more servings at one meal, count them as 1 carbohydrate serving. Other Carbohydrates 3/4 ounces chips or pretzels 1/2 cup ice cream or frozen yogurt 1/4 cup sherbet or sorbet 2 inch square cake, no frosting 1 tablespoon honey, sugar, jam, jelly, or syrup 2 small cookies 3 squares of graham crackers 3 cups popcorn 6 crackers 1 cup broth-based soup Count 1 cup casserole or other mixed foods as 2 carbohydrate servings. Foods with less than 20 calories in a serving may be counted as 0 carbohydrates or a "free" food. You may want to purchase a book or computer software that lists the carbohydrate gram counts of different foods. In addition, the nutrition facts panel on the labels of the foods you eat are a good source of this information. The label will tell you how big the serving size is and the total number of carbohydrate grams you will be eating per serving. Divide this number by 15 to obtain the number of carbohydrate servings in a portion. Remember: 1 carbohydrate serving equals 15 grams of carbohydrate. SERVING SIZES Measuring foods and serving sizes helps you make sure you are getting the right amount of food. The list below tells how big or small some common serving sizes are.  1 ounce (oz) of cheese.................................4 stacked dice.   2 to 3 oz cooked meat..................................Deck of cards.   1 teaspoon (tsp)............................................Tip of little finger.   1 tablespoon (tbs).........................................Thumb.   2 tbs.............................................................Golf ball.    cup...........................................................Half of a fist.   1 cup............................................................A fist.    SAMPLE DIABETES MEAL PLAN Below is a sample meal plan that includes foods from the grain and starches, dairy, vegetable, fruit, and  meat groups. A dietician can individualize a meal plan to fit your calorie needs and tell you the number of servings needed from each food group. However, controlling the total amount of carbohydrates in your meal or snack is more important than making sure you include all of the food groups at every meal. You may interchange carbohydrate containing foods (dairy, starches, and fruits). The meal plan below is an example of a 2000 calorie diet using carbohydrate counting. This meal plan has 17 carbohydrate servings (carb choices). Breakfast 1 cup oatmeal (2 carb choices) 3/4 cup light yogurt (1 carb choice) 1 cup blueberries (1 carb choice) 1/4 cup almonds  Snack 1 large apple (2 carb choices) 1 low-fat string cheese stick  Lunch Chicken breast salad:  1 cup spinach   1/4 cup chopped tomatoes   2 oz chicken breast, sliced   2 tbs low-fat [place] dressing  12 whole-wheat crackers (2 carb choices) 12 to 15 grapes (1 carb choice) 1 cup low-fat milk (1 carb choice)  Snack 1 cup carrots 1/2 cup hummus (1 carb choice)  Dinner 3 oz broiled salmon 1 cup brown rice (3 carb choices)  Snack 1 1/2 cups steamed broccoli (1 carb choice) drizzled with 1 tsp olive oil and lemon juice 1 cup light pudding (2 carb choices)  DIABETES MEAL PLANNING WORKSHEET Your dietician can use this worksheet to help you decide how many servings of foods and what types of foods are right for you.  Breakfast Food Group and Servings Carb Choices Grain/Starches _______________________________________ Dairy ______________________________________________ Vegetable _______________________________________ Fruit _______________________________________________ Meat _______________________________________________ Fat _____________________________________________ Lunch Food Group and Servings Carb Choices Grain/Starches ________________________________________ Dairy _______________________________________________ Fruit  ________________________________________________ Meat ________________________________________________ Fat _____________________________________________ Dinner Food Group and Servings Carb Choices Grain/Starches ________________________________________ Dairy _______________________________________________ Fruit ________________________________________________ Meat ________________________________________________ Fat _____________________________________________ Snacks Food Group and Servings Carb Choices Grain/Starches ________________________________________ Dairy _______________________________________________ Vegetable ________________________________________ Fruit ________________________________________________ Meat ________________________________________________ Fat _____________________________________________ Daily Totals Starches _________________________ Vegetable __________________________ Fruit ______________________________ Dairy ______________________________ Meat ______________________________ Fat ________________________________  Document Released: 11/11/2004 Document Re-Released: 08/04/2009 ExitCare Patient Information 2011 [place], [place].Diabetes and Exercise Regular exercise is important and can help:   Control blood glucose (sugar).   Decrease blood pressure.   Control blood lipids (cholesterol and triglycerides).   Improve overall health.  BENEFITS FROM EXERCISE:  Improved fitness.   Improved flexibility.   Improved endurance.   Increased bone density.   Weight control.   Increased muscle strength.   Decreased body fat.   Improvement of the body's use of a hormone called insulin.   Increased insulin sensitivity.   Reduction of insulin needs.   Helps you feel better.   Reduces stress and tension.  People with diabetes who add exercise to their lifestyle gain additional benefits.   Weight loss.   Reduces appetite.   Improves  body's use of blood glucose (sugar).   Decreases risk factors for heart disease:   Lowering of cholesterol and triglycerides.   Raising the level of good cholesterol (high-density lipoproteins [HDL]).   Lowering blood sugar.   Decreases blood pressure.  TYPE 1 DIABETES AND EXERCISE  Exercise will usually lower your blood glucose.   If blood glucose is greater than 240 mg/dl, check urine ketones. If ketones are present, do not exercise.   Location of the insulin injection sites may need to be adjusted with exercise. Avoid injecting insulin into areas  of the body that will be exercised. For example, avoid injecting insulin into:   The arms when playing tennis.   The legs when jogging. For more information, discuss this with your caregiver.   Keep a record of:   Food intake.   Type and amount of exercise.   Expected peak times of insulin action.   Blood glucose (sugar) levels.  Do this before, during and after exercise. Review your records with your caregiver(s). This will help you to develop guidelines for adjusting food intake and/or insulin amounts.  TYPE 2 DIABETES AND EXERCISE  Regular physical activity can help control blood glucose.   Exercise is important because it may:   Increase the body's sensitivity to insulin.   Improve blood glucose control.   Exercise reduces the risk of heart disease. It decreases serum cholesterol and triglycerides. It also lowers blood pressure.   Those who take insulin or oral hypoglycemic agents should watch for signs of hypoglycemia. These signs include dizziness, shaking, sweating, chills and confusion.   Body water is lost during exercise. It must be replaced. This will help to avoid loss of body fluids (dehydration) and/or heat stroke.  Be sure to talk to your caregiver before starting an exercise program to make sure it is safe for you. Remember, any activity is better than none.  Document Released: 05/07/2003 Document  Re-Released: 12/12/2008 [facility] Patient Information 2011 [place], [place].

## 2010-11-22 NOTE — Progress Notes (Signed)
CBG=97 mg/dl 

## 2010-11-22 NOTE — Progress Notes (Signed)
Vaginal discharge is pink slimy. Pulse 111.

## 2010-11-24 ENCOUNTER — Ambulatory Visit (HOSPITAL_COMMUNITY): Admission: RE | Admit: 2010-11-24 | Payer: Medicaid Other | Source: Ambulatory Visit

## 2010-11-24 ENCOUNTER — Ambulatory Visit (HOSPITAL_COMMUNITY)
Admission: RE | Admit: 2010-11-24 | Discharge: 2010-11-24 | Disposition: A | Discharge status: Home / Self Care | Payer: Medicaid Other | Source: Ambulatory Visit | Attending: Physician Assistant | Admitting: Physician Assistant

## 2010-11-24 DIAGNOSIS — O99810 Abnormal glucose complicating pregnancy: Secondary | ICD-10-CM | POA: Insufficient documentation

## 2010-11-24 DIAGNOSIS — O24919 Unspecified diabetes mellitus in pregnancy, unspecified trimester: Secondary | ICD-10-CM

## 2010-11-24 DIAGNOSIS — E669 Obesity, unspecified: Secondary | ICD-10-CM | POA: Insufficient documentation

## 2010-11-24 DIAGNOSIS — O99210 Obesity complicating pregnancy, unspecified trimester: Secondary | ICD-10-CM | POA: Insufficient documentation

## 2010-11-26 LAB — CULTURE, OB URINE: Colony Count: 25000

## 2010-11-27 ENCOUNTER — Other Ambulatory Visit: Payer: Self-pay | Admitting: Physician Assistant

## 2010-11-27 MED ORDER — AMOXICILLIN 500 MG PO CAPS: 500.0000 mg | ORAL_CAPSULE | Freq: Four times a day (QID) | ORAL | Status: AC

## 2010-11-29 ENCOUNTER — Ambulatory Visit (INDEPENDENT_AMBULATORY_CARE_PROVIDER_SITE_OTHER): Payer: Medicaid Other | Admitting: Family Medicine

## 2010-11-29 ENCOUNTER — Other Ambulatory Visit: Payer: Self-pay | Admitting: Obstetrics & Gynecology

## 2010-11-29 VITALS — BP 125/85 | Temp 96.6°F | Wt 259.4 lb

## 2010-11-29 DIAGNOSIS — Z23 Encounter for immunization: Secondary | ICD-10-CM

## 2010-11-29 DIAGNOSIS — O24919 Unspecified diabetes mellitus in pregnancy, unspecified trimester: Secondary | ICD-10-CM

## 2010-11-29 LAB — POCT URINALYSIS DIP (DEVICE)
Ketones, ur: 15 mg/dL — AB
Nitrite: NEGATIVE
Protein, ur: NEGATIVE mg/dL

## 2010-11-29 MED ORDER — GLYBURIDE 5 MG PO TABS: 5.0000 mg | ORAL_TABLET | Freq: Every day | ORAL | Status: DC

## 2010-11-29 MED ORDER — INFLUENZA VIRUS VACC SPLIT PF IM SUSP
0.5000 mL | Freq: Once | INTRAMUSCULAR | Status: AC
  Administered 2010-11-29: 0.5 mL via INTRAMUSCULAR

## 2010-11-29 MED ORDER — GLUCOSE BLOOD VI STRP: ORAL_STRIP | Status: DC

## 2010-11-29 NOTE — Progress Notes (Signed)
Subjective:    Kathy Sandoval is a 25 y.o. female being seen today for her obstetrical visit. She is at [redacted]w[redacted]d gestation. Patient reports no complaints. Fetal movement: normal.  Menstrual History: OB History    Grav Para Term Preterm Abortions TAB SAB Ect Mult Living   2 1 1  0 0 0 0 0 0 1     Patient's last menstrual period was 04/05/2010.    Objective:    BP 125/85  Temp 96.6 F (35.9 C)  Wt 117.663 kg (259 lb 6.4 oz)  LMP 04/05/2010 FHT:  150-160 BPM  Uterine Size: size greater than dates at 34 cm  Presentation: unsure    Fasting CBG: 113-118 2 hr after Breakfast: 77-163 2 hr after Lunch: 97-141 2 hr after dinner: 117-119 Assessment:    Pregnancy 30 and 6/7 weeks  GDM/A2 Plan:  Will get [place] to assess size>dates and increased AC circumference on previous US Will start 2x weekly NST Is GBS carrier (urine culture positive), to get Amoxicillin today Will start glyburide 5mg  daily Infant feeding: breast and bottle feed. Follow up in 1 Week.

## 2010-11-29 NOTE — Progress Notes (Signed)
Pt would like to get flu vaccine today, Consent Signed. Pt notified to pick up Amoxicillin RX from pharmacy for UTI.

## 2010-11-29 NOTE — Progress Notes (Signed)
Fasting blood glucose levels high.  Starting Glyburide 5 mg.  Given glucose levels wold probably best benefit with the Glyburide at bedtime rather than with breakfast.  Did a review of carb counting and meal/snaks to use.  Maggie Vincenta Steffey, RN, RD, CDE

## 2010-11-29 NOTE — Patient Instructions (Signed)
SEEK IMMEDIATE MEDICAL CARE IF:  You develop contractions that continue to become stronger, more regular, and closer together.   You have a gushing, burst or leaking of fluid from the vagina.   An oral temperature above 100.4 develops.   You have passage of blood-tinged mucus.   You develop vaginal bleeding.   You develop continuous belly (abdominal) pain.   You have low back pain that you never had before.   You feel the baby's head pushing down causing pelvic pressure.   The baby is not moving as much as it used to.  Document Released: 02/14/2005 Document Re-Released: 08/04/2009 [employer] Patient Information 2011 [employer].Gestational Diabetes Mellitus (GDM) Gestational diabetes mellitus (GDM) is diabetes that occurs only during pregnancy. This happens when the body cannot properly handle the glucose (sugar) that increases in the blood after eating. During pregnancy, insulin resistance (reduced sensitivity to insulin) occurs because of the release of hormones from the placenta. Usually, the pancreas of pregnant women produces enough insulin to overcome the resistance that occurs. However, in gestational diabetes, the insulin is there but it does not work effectively. If the resistance is severe enough that the pancreas does not produce enough insulin, extra glucose builds up in the blood.  WHO IS AT RISK FOR DEVELOPING GESTATIONAL DIABETES?  Women with a history of diabetes in the family.   Women over age 25.   Women who are overweight.   Women in certain ethnic groups (Hispanic, African American, Native American, Asian and Pacific Islander).  WHAT CAN HAPPEN TO THE BABY? If the mother's blood glucose is too high while she is pregnant, the extra sugar will travel through the umbilical cord to the baby. Some of the problems the baby may have are:  Large Baby - If the baby receives too much sugar, the baby will gain more weight. This may cause the baby to be too large to be  born normally (vaginally) and a Cesarean section (C-section) may be needed.   Low Blood Glucose (hypoglycemia) - The baby makes extra insulin, in response to the extra sugar its gets from its mother. When the baby is born and no longer needs this extra insulin, the baby's blood glucose level may drop.   Jaundice (yellow coloring of the skin and eyes) - This is fairly common in babies. It is caused from a build-up of the chemical called bilirubin. This is rarely serious, but is seen more often in babies whose mothers had gestational diabetes.  RISKS TO THE MOTHER Women who have had gestational diabetes may be at higher risk for some problems, including:  Preeclampsia or toxemia, which includes problems with high blood pressure. Blood pressure and protein levels in the urine must be checked frequently.   Infections.   Cesarean section (C-section) for delivery.   Developing Type 2 diabetes later in life. About 30-50% will develop diabetes later, especially if obese.  DIAGNOSIS The hormones that cause insulin resistance are highest at about 24-28 weeks of pregnancy. If symptoms are experienced, they are much like symptoms you would normally expect during pregnancy.  GDM is often diagnosed using a two part method: 1. After 24-28 weeks of pregnancy, the woman drinks a glucose solution and takes a blood test. If the glucose level is high, a second test will be given.  2. Oral Glucose Tolerance Test (OGTT) which is 3 hours long - After not eating overnight, the blood glucose is checked. The woman drinks a glucose solution, and hourly blood glucose tests   are taken.  If the woman has risk factors for GDM, the caregiver may test earlier than 24 weeks of pregnancy. TREATMENT Treatment of GDM is directed at keeping the mother's blood glucose level normal, and may include:  Meal planning.   Taking insulin or other medicine to control your blood glucose level.   Exercise.   Keeping a daily record of  the foods you eat.   Blood glucose monitoring and keeping a record of your blood glucose levels.   May monitor ketone levels in the urine, although this is no longer considered necessary in most pregnancies.  HOME CARE INSTRUCTIONS While you are pregnant:  Follow your caregiver's advice regarding your prenatal appointments, meal planning, exercise, medicines, vitamins, blood and other tests, and physical activities.   Keep a record of your meals, blood glucose tests, and the amount of insulin you are taking (if any). Show this to your caregiver at every prenatal visit.   If you have GDM, you may have problems with hypoglycemia (low blood glucose). You may suspect this if you become suddenly dizzy, feel shaky, and/or weak. If you think this is happening and you have a glucose meter, try to test your blood glucose level. Follow your caregiver's advice for when and how to treat your low blood glucose. Generally, the 15:15 rule is followed: Treat by consuming 15 grams of carbohydrates, wait 15 minutes, and recheck blood glucose. Examples of 15 grams of carbohydrates are:   1 cup skim or low fat milk.    cup juice.   3-4 glucose tablets.   5-6 hard candies.   1 small box raisins.    cup regular soda pop.   Practice good hygiene, to avoid infections.   Do not smoke.  SEEK MEDICAL CARE IF:  You develop abnormal vaginal discharge, with or without itching.   You become weak and tired more than expected.   You seem to sweat a lot.   You have a sudden increase in weight, 5 pounds or more in one week.   You are losing weight, 3 pounds or more in a week.   Your blood glucose level is high, and you need instructions on what to do about it.  SEEK IMMEDIATE MEDICAL CARE IF:  You develop a severe headache.   You faint or pass out.   You develop nausea and vomiting.   You become disoriented or confused.   You have a convulsion.   You develop vision problems.   You develop  stomach pain.   You develop vaginal bleeding.   You develop uterine contractions.   You have leaking or a gush of fluid from the vagina.  AFTER YOU HAVE THE BABY:  Go to all of your follow-up appointments, and have blood tests as advised by your caregiver.   Maintain a healthy lifestyle, to prevent diabetes in the future. This includes:   Following a healthy meal plan.   Controlling your weight.   Getting enough exercise and proper rest.   Do not smoke.   Breastfeed your baby if you can. This will lower the chance of you and your baby developing diabetes later in life.  For more information about diabetes, go to the American Diabetes Association at: www.americandiabetesassociation.org. For more information about gestational diabetes, go to the American Congress of Obstetricians and Gynecologists at: www.acog.org. Document Released: 05/23/2000 Document Re-Released: 02/02/2009 [employer] Patient Information 2011 [employer]. 

## 2010-12-02 ENCOUNTER — Ambulatory Visit (HOSPITAL_COMMUNITY)
Admission: RE | Admit: 2010-12-02 | Discharge: 2010-12-02 | Disposition: A | Discharge status: Home / Self Care | Payer: Medicaid Other | Source: Ambulatory Visit | Attending: Family Medicine | Admitting: Family Medicine

## 2010-12-02 DIAGNOSIS — O99210 Obesity complicating pregnancy, unspecified trimester: Secondary | ICD-10-CM | POA: Insufficient documentation

## 2010-12-02 DIAGNOSIS — O24919 Unspecified diabetes mellitus in pregnancy, unspecified trimester: Secondary | ICD-10-CM

## 2010-12-02 DIAGNOSIS — E669 Obesity, unspecified: Secondary | ICD-10-CM | POA: Insufficient documentation

## 2010-12-02 DIAGNOSIS — O99810 Abnormal glucose complicating pregnancy: Secondary | ICD-10-CM | POA: Insufficient documentation

## 2010-12-06 ENCOUNTER — Ambulatory Visit (INDEPENDENT_AMBULATORY_CARE_PROVIDER_SITE_OTHER): Payer: Medicaid Other | Admitting: Family Medicine

## 2010-12-06 ENCOUNTER — Other Ambulatory Visit: Payer: Self-pay | Admitting: Obstetrics & Gynecology

## 2010-12-06 ENCOUNTER — Encounter: Payer: Self-pay | Admitting: Family Medicine

## 2010-12-06 VITALS — BP 118/78 | HR 119 | Temp 97.0°F | Wt 260.7 lb

## 2010-12-06 DIAGNOSIS — O24419 Gestational diabetes mellitus in pregnancy, unspecified control: Secondary | ICD-10-CM

## 2010-12-06 DIAGNOSIS — Z331 Pregnant state, incidental: Secondary | ICD-10-CM

## 2010-12-06 DIAGNOSIS — O0990 Supervision of high risk pregnancy, unspecified, unspecified trimester: Secondary | ICD-10-CM

## 2010-12-06 DIAGNOSIS — O99810 Abnormal glucose complicating pregnancy: Secondary | ICD-10-CM

## 2010-12-06 LAB — POCT URINALYSIS DIP (DEVICE)
Bilirubin Urine: NEGATIVE
Glucose, UA: NEGATIVE mg/dL
Nitrite: NEGATIVE

## 2010-12-06 MED ORDER — GLYBURIDE 2.5 MG PO TABS
2.5000 mg | ORAL_TABLET | Freq: Every day | ORAL | Status: DC
Start: 2010-12-06 — End: 2011-01-21

## 2010-12-06 NOTE — Progress Notes (Signed)
Pain: none Pressure: pelvic D/C: mucous, yellow

## 2010-12-06 NOTE — Progress Notes (Signed)
GDM - fasting cbg less than 90.  2hr PP 96 - 160 with most values 130s.  Will start glyburide 2.5mg  in AM in addition to 5mg  qhs.   Repeat BP 118/78.   NST - category 1

## 2010-12-08 ENCOUNTER — Ambulatory Visit: Payer: Medicaid Other

## 2010-12-09 ENCOUNTER — Ambulatory Visit (INDEPENDENT_AMBULATORY_CARE_PROVIDER_SITE_OTHER): Payer: Medicaid Other | Admitting: *Deleted

## 2010-12-09 VITALS — BP 116/57

## 2010-12-09 DIAGNOSIS — O99810 Abnormal glucose complicating pregnancy: Secondary | ICD-10-CM

## 2010-12-09 NOTE — Progress Notes (Signed)
P = 94   NST/AFI only today

## 2010-12-13 ENCOUNTER — Ambulatory Visit (INDEPENDENT_AMBULATORY_CARE_PROVIDER_SITE_OTHER): Payer: Medicaid Other | Admitting: Physician Assistant

## 2010-12-13 VITALS — BP 126/84 | Temp 97.7°F | Wt 261.2 lb

## 2010-12-13 DIAGNOSIS — O24919 Unspecified diabetes mellitus in pregnancy, unspecified trimester: Secondary | ICD-10-CM

## 2010-12-13 DIAGNOSIS — H539 Unspecified visual disturbance: Secondary | ICD-10-CM

## 2010-12-13 LAB — POCT URINALYSIS DIP (DEVICE)
Bilirubin Urine: NEGATIVE
Glucose, UA: 250 mg/dL — AB
Nitrite: NEGATIVE

## 2010-12-13 NOTE — Progress Notes (Signed)
GDM on Glyburide, BS much improved on increase dosage!!! FBS: 67-78 2pp: 73-116, 124, 129, 135 12/02/10 EFW 5#4 (84%) AFI WNL  Continue AT today 2 x week

## 2010-12-13 NOTE — Patient Instructions (Signed)
Exercise During Pregnancy   It is possible to maintain a healthy exercise program throughout a pregnancy. However, it is important to discuss exercising with your caregiver, so that the two of you may develop an appropriate exercise program. It is important to remember that exercise during pregnancy should be use to maintain one's health and not to lose weight. Strenuous activities should be avoided as the may cause the baby to have difficulty obtaining proper amounts of oxygen. A proper pregnancy exercise plan has many benefits including preparing you for the physical challenges of childbirth by strengthening the muscles that help with childbirth, reducing common backaches, alleviating constipation, improving posture, elevating mood, and promoting better sleep. If possible, begin exercising regularly before you become pregnant and continue through the duration of the pregnancy. It is difficult for a woman to begin an exercise program later in a pregnancy due to enlargement of the uterus and breasts and a shift in the center of gravity. Pregnancy exercise programs should be aimed at improving the muscles of the heart, back, pelvis, and abdomen.   GENERAL GUIDELINES  Every woman and pregnancy is different; thus, the level of exercise you can do depends on your health, the conditions of the pregnancy, and activity level before pregnancy. For women who were sedentary before pregnancy, walking is a good way to begin. Use caution while participation in sports during pregnancy, because your center of gravity changes and may affect your balance or that require rapid movements. Always make sure to drink plenty of fluids to avoid dehydration, which may decrease blood flow to your baby. Avoid any activity that has the potential for trauma to the abdomen. If possible try to avoid high-altitude activities, which can deprive you and your baby of oxygen; this may cause premature labor. Talk with your caregiver.      Performing a proper warm up and cool down are very important. It is important to start slowly and build up to more demanding exercises. Toward the end of an exercise session, gradually slow your activity. Perhaps try and work back through the exercises in reverse order. Check your pulse during peak activity, and discuss with your caregiver an appropriate range of heart rate for activity. Slow down your activity if your heart starts beating faster than the target range recommended by your health care provider. Do not exceed a heart rate of 140 beats per minute. Exercise that is too strenuous may speed up the baby's heartbeat to a dangerous level. In general, if you are able to carry on a conversation comfortably while exercising, your heart rate is probably within the recommended limits. Check to make sure.    You should stop exercising and call your health care provider if you have any unusual symptoms, such as pain, uterine contractions, chest pain, bleeding or fluid leakage from the vagina, dizziness, or shortness of breath. Talk to your health caregiver if you have any questions.    Document Released: 02/14/2005  Document Re-Released: 12/12/2008 [facility] Patient Information 2011 [place], [place].

## 2010-12-16 ENCOUNTER — Ambulatory Visit (INDEPENDENT_AMBULATORY_CARE_PROVIDER_SITE_OTHER): Payer: Medicaid Other | Admitting: *Deleted

## 2010-12-16 VITALS — BP 123/67

## 2010-12-16 DIAGNOSIS — O24919 Unspecified diabetes mellitus in pregnancy, unspecified trimester: Secondary | ICD-10-CM

## 2010-12-16 NOTE — Progress Notes (Signed)
P = 97  NST/AFI only today

## 2010-12-16 NOTE — Progress Notes (Signed)
NST reviewed and reactive.  

## 2010-12-20 ENCOUNTER — Encounter (HOSPITAL_COMMUNITY): Payer: Self-pay

## 2010-12-20 ENCOUNTER — Inpatient Hospital Stay (HOSPITAL_COMMUNITY)
Admission: AD | Admit: 2010-12-20 | Discharge: 2010-12-20 | Disposition: A | Discharge status: Home / Self Care | Payer: Medicaid Other | Source: Ambulatory Visit | Attending: Obstetrics & Gynecology | Admitting: Obstetrics & Gynecology

## 2010-12-20 ENCOUNTER — Ambulatory Visit (INDEPENDENT_AMBULATORY_CARE_PROVIDER_SITE_OTHER): Payer: Medicaid Other | Admitting: Obstetrics & Gynecology

## 2010-12-20 VITALS — BP 150/92 | Temp 97.6°F | Wt 262.0 lb

## 2010-12-20 DIAGNOSIS — O2340 Unspecified infection of urinary tract in pregnancy, unspecified trimester: Secondary | ICD-10-CM

## 2010-12-20 DIAGNOSIS — Z331 Pregnant state, incidental: Secondary | ICD-10-CM

## 2010-12-20 DIAGNOSIS — Z36 Encounter for antenatal screening of mother: Secondary | ICD-10-CM

## 2010-12-20 DIAGNOSIS — O24919 Unspecified diabetes mellitus in pregnancy, unspecified trimester: Secondary | ICD-10-CM

## 2010-12-20 DIAGNOSIS — R000 Tachycardia, unspecified: Secondary | ICD-10-CM | POA: Insufficient documentation

## 2010-12-20 DIAGNOSIS — Z3689 Encounter for other specified antenatal screening: Secondary | ICD-10-CM

## 2010-12-20 DIAGNOSIS — O99891 Other specified diseases and conditions complicating pregnancy: Secondary | ICD-10-CM | POA: Insufficient documentation

## 2010-12-20 DIAGNOSIS — O99810 Abnormal glucose complicating pregnancy: Secondary | ICD-10-CM | POA: Insufficient documentation

## 2010-12-20 LAB — POCT URINALYSIS DIP (DEVICE)
Glucose, UA: NEGATIVE mg/dL
Nitrite: NEGATIVE
Protein, ur: NEGATIVE mg/dL
Specific Gravity, Urine: 1.02 (ref 1.005–1.030)
Urobilinogen, UA: 1 mg/dL (ref 0.0–1.0)

## 2010-12-20 LAB — URINALYSIS, ROUTINE W REFLEX MICROSCOPIC
Bilirubin Urine: NEGATIVE
Glucose, UA: NEGATIVE mg/dL
Hgb urine dipstick: NEGATIVE
Ketones, ur: NEGATIVE mg/dL
Protein, ur: NEGATIVE mg/dL
pH: 7 (ref 5.0–8.0)

## 2010-12-20 LAB — URINE MICROSCOPIC-ADD ON

## 2010-12-20 MED ORDER — CEPHALEXIN 500 MG PO CAPS: 500.0000 mg | ORAL_CAPSULE | Freq: Three times a day (TID) | ORAL | Status: AC

## 2010-12-20 NOTE — Progress Notes (Signed)
Less fetal movement in last week. NST reviewed, baseline 165-170, no decel, no 15x15 accels, rec cont monitoring in [facility]

## 2010-12-20 NOTE — Progress Notes (Signed)
BP re-check = 130/71   Pt denies H/A or visual disturbances

## 2010-12-20 NOTE — Progress Notes (Signed)
Pt states was seen in [facility] this am, told to come up for NST-baby's FHR too fast. Is gestational DM, taking po medications. +FM this am, denies bleeding or vag d/c changes.

## 2010-12-20 NOTE — Progress Notes (Signed)
Pt feels pelvic pressure and pain, also having irregular contractions.

## 2010-12-20 NOTE — ED Provider Notes (Signed)
Kathy Sandoval is a 25 y.o. female at [redacted]w[redacted]d sent from the [facility] for fetal monitoring for tachycardia at her prenatal visit today at serial BPs for one elevated BP. She reports pos FM and denies UC's, LOF, VB, fever, chills, UTI Sx, flank pain or other Sx infection. Also denies HA, vision changes or epigastric pain.  History OB History    Grav Para Term Preterm Abortions TAB SAB Ect Mult Living   2 1 1  0 0 0 0 0 0 1     Past Medical History  Diagnosis Date  . Anemia   . Diabetes in pregnancy 11/08/2010   Past Surgical History  Procedure Date  . No past surgeries    Family History: family history includes Thyroid disease in her mother. Social History:  reports that she quit smoking about 14 months ago. Her smoking use included Cigarettes. She has never used smokeless tobacco. She reports that she does not drink alcohol or use illicit drugs.  ROS: Otherwise neg    Blood pressure 132/85, pulse 91, temperature 97.1 F (36.2 C), temperature source Oral, resp. rate 16, height 5\' 6"  (1.676 m), weight 119.013 kg (262 lb 6 oz), last menstrual period 04/05/2010. Maternal Exam:  Uterine Assessment: None  Abdomen: Patient reports no abdominal tenderness. Introitus: not evaluated.     Fetal Exam Fetal Monitor Review: Mode: ultrasound.   Baseline rate: 150.  Variability: moderate (6-25 bpm).   Pattern: accelerations present and no decelerations.    Fetal State Assessment: Category I - tracings are normal.     Physical Exam  Prenatal labs: ABO, Rh: O/POS/-- (04/18 1219) Antibody: NEG (04/18 1219) Rubella: 13.6 (04/18 1219) RPR: NON REAC (09/10 1131)  HBsAg: NEGATIVE (04/18 1219)  HIV: NON REACTIVE (09/10 1131)  GBS:    Patient Vitals for the past 24 hrs:  BP Temp Temp src Pulse Resp Height Weight  12/20/10 1538 124/80 mmHg - - 91  - - -  12/20/10 1447 135/67 mmHg - - 88  - - -  12/20/10 1438 90/54 mmHg - - 86  - - -  12/20/10 1423 93/57 mmHg - - 89  - - -  12/20/10 1410 117/73  mmHg - - 93  - - -  12/20/10 1355 122/73 mmHg - - 89  - - -  12/20/10 1340 0/0 mmHg - - 102  - - -  12/20/10 1325 131/86 mmHg - - 88  - - -  12/20/10 1306 132/85 mmHg 97.1 F (36.2 C) Oral 91  16  5\' 6"  (1.676 m) 119.013 kg (262 lb 6 oz)    Assessment/Plan: 1. FHR category I 2. Normotensive.  3. 34.6 week IUP 4. GDM on Glyburide  Plan: 1. D/C home 2. PTL precautions and fetal kick counts 3. F/U at [facility] for NST in 3 days.   [name] 12/20/2010, 1:56 PM

## 2010-12-20 NOTE — Progress Notes (Signed)
NST from 12/09/10 Reactive

## 2010-12-21 LAB — URINE CULTURE
Culture  Setup Time: 201210230048
Special Requests: NORMAL

## 2010-12-23 ENCOUNTER — Ambulatory Visit (INDEPENDENT_AMBULATORY_CARE_PROVIDER_SITE_OTHER): Payer: Medicaid Other | Admitting: *Deleted

## 2010-12-23 VITALS — BP 121/60 | Wt 262.0 lb

## 2010-12-23 DIAGNOSIS — O24919 Unspecified diabetes mellitus in pregnancy, unspecified trimester: Secondary | ICD-10-CM

## 2010-12-23 LAB — CULTURE, OB URINE: Colony Count: 45000

## 2010-12-23 NOTE — Progress Notes (Signed)
P = 92        NST/AFI only today

## 2010-12-27 ENCOUNTER — Ambulatory Visit (INDEPENDENT_AMBULATORY_CARE_PROVIDER_SITE_OTHER): Payer: Medicaid Other | Admitting: Obstetrics and Gynecology

## 2010-12-27 DIAGNOSIS — O24919 Unspecified diabetes mellitus in pregnancy, unspecified trimester: Secondary | ICD-10-CM

## 2010-12-27 DIAGNOSIS — E669 Obesity, unspecified: Secondary | ICD-10-CM

## 2010-12-27 DIAGNOSIS — Z22330 Carrier of Group B streptococcus: Secondary | ICD-10-CM

## 2010-12-27 LAB — POCT URINALYSIS DIP (DEVICE)
Glucose, UA: NEGATIVE mg/dL
Hgb urine dipstick: NEGATIVE
Specific Gravity, Urine: 1.02 (ref 1.005–1.030)
Urobilinogen, UA: 0.2 mg/dL (ref 0.0–1.0)

## 2010-12-27 NOTE — Progress Notes (Signed)
CBG well controlled on glyburide. Patient denies HA, visual disturbances, RUQ/epigasric pain. Si/sx of preeclampsia reviewed. FM/labor precautions reviewed. Close monitoring of BP.cultures next visit

## 2010-12-27 NOTE — Progress Notes (Signed)
BP re-check = 122/68

## 2010-12-27 NOTE — Progress Notes (Signed)
Pelvic pressure. Pulse 106.

## 2010-12-27 NOTE — Progress Notes (Signed)
Addended by: Jill Side on: 12/27/2010 11:46 AM   Modules accepted: Orders

## 2010-12-30 ENCOUNTER — Ambulatory Visit (INDEPENDENT_AMBULATORY_CARE_PROVIDER_SITE_OTHER): Payer: Medicaid Other | Admitting: *Deleted

## 2010-12-30 VITALS — BP 135/71 | Wt 264.0 lb

## 2010-12-30 DIAGNOSIS — O24919 Unspecified diabetes mellitus in pregnancy, unspecified trimester: Secondary | ICD-10-CM

## 2010-12-30 DIAGNOSIS — O24419 Gestational diabetes mellitus in pregnancy, unspecified control: Secondary | ICD-10-CM

## 2010-12-30 NOTE — Progress Notes (Signed)
P = 95  NST/AFI only today

## 2011-01-03 ENCOUNTER — Ambulatory Visit (INDEPENDENT_AMBULATORY_CARE_PROVIDER_SITE_OTHER): Payer: Medicaid Other | Admitting: Family Medicine

## 2011-01-03 VITALS — BP 124/90 | Temp 96.8°F | Wt 260.3 lb

## 2011-01-03 DIAGNOSIS — Z23 Encounter for immunization: Secondary | ICD-10-CM

## 2011-01-03 DIAGNOSIS — R8281 Pyuria: Secondary | ICD-10-CM

## 2011-01-03 DIAGNOSIS — O24919 Unspecified diabetes mellitus in pregnancy, unspecified trimester: Secondary | ICD-10-CM

## 2011-01-03 DIAGNOSIS — R82998 Other abnormal findings in urine: Secondary | ICD-10-CM

## 2011-01-03 DIAGNOSIS — O99810 Abnormal glucose complicating pregnancy: Secondary | ICD-10-CM

## 2011-01-03 LAB — POCT URINALYSIS DIP (DEVICE)
Protein, ur: 30 mg/dL — AB
Urobilinogen, UA: 1 mg/dL (ref 0.0–1.0)

## 2011-01-03 MED ORDER — TETANUS-DIPHTH-ACELL PERTUSSIS 5-2.5-18.5 LF-MCG/0.5 IM SUSP: 0.5000 mL | Freq: Once | INTRAMUSCULAR | Status: DC

## 2011-01-03 NOTE — Progress Notes (Signed)
FBS-63-85 2hr pp 96-132 U/S growth 38 wks, IOL at 39wks NST reviewed and reactive. Cultures today

## 2011-01-03 NOTE — Patient Instructions (Signed)
Gestational Diabetes Mellitus Gestational diabetes mellitus (GDM) is diabetes that occurs only during pregnancy. This happens when the body cannot properly handle the glucose (sugar) that increases in the blood after eating. During pregnancy, insulin resistance (reduced sensitivity to insulin) occurs because of the release of hormones from the placenta. Usually, the pancreas of pregnant women produces enough insulin to overcome the resistance that occurs. However, in gestational diabetes, the insulin is there but it does not work effectively. If the resistance is severe enough that the pancreas does not produce enough insulin, extra glucose builds up in the blood.  WHO IS AT RISK FOR DEVELOPING GESTATIONAL DIABETES?  Women with a history of diabetes in the family.   Women over age 26.   Women who are overweight.   Women in certain ethnic groups (Hispanic, African American, Native American, [place] and [place]).  WHAT CAN HAPPEN TO THE BABY? If the mother's blood glucose is too high while she is pregnant, the extra sugar will travel through the umbilical cord to the baby. Some of the problems the baby may have are:  Large Baby - If the baby receives too much sugar, the baby will gain more weight. This may cause the baby to be too large to be born normally (vaginally) and a Cesarean section (C-section) may be needed.   Low Blood Glucose (hypoglycemia) - The baby makes extra insulin, in response to the extra sugar its gets from its mother. When the baby is born and no longer needs this extra insulin, the baby's blood glucose level may drop.   Jaundice (yellow coloring of the skin and eyes) - This is fairly common in babies. It is caused from a build-up of the chemical called bilirubin. This is rarely serious, but is seen more often in babies whose mothers had gestational diabetes.  RISKS TO THE MOTHER Women who have had gestational diabetes may be at higher risk for some problems,  including:  Preeclampsia or toxemia, which includes problems with high blood pressure. Blood pressure and protein levels in the urine must be checked frequently.   Infections.   Cesarean section (C-section) for delivery.   Developing Type 2 diabetes later in life. About 30-50% will develop diabetes later, especially if obese.  DIAGNOSIS  The hormones that cause insulin resistance are highest at about 24-28 weeks of pregnancy. If symptoms are experienced, they are much like symptoms you would normally expect during pregnancy.  GDM is often diagnosed using a two part method: 1. After 24-28 weeks of pregnancy, the woman drinks a glucose solution and takes a blood test. If the glucose level is high, a second test will be given.  2. Oral Glucose Tolerance Test (OGTT) which is 3 hours long - After not eating overnight, the blood glucose is checked. The woman drinks a glucose solution, and hourly blood glucose tests are taken.  If the woman has risk factors for GDM, the caregiver may test earlier than 24 weeks of pregnancy. TREATMENT  Treatment of GDM is directed at keeping the mother's blood glucose level normal, and may include:  Meal planning.   Taking insulin or other medicine to control your blood glucose level.   Exercise.   Keeping a daily record of the foods you eat.   Blood glucose monitoring and keeping a record of your blood glucose levels.   May monitor ketone levels in the urine, although this is no longer considered necessary in most pregnancies.  HOME CARE INSTRUCTIONS  While you are pregnant:  Follow your caregiver's advice regarding your prenatal appointments, meal planning, exercise, medicines, vitamins, blood and other tests, and physical activities.   Keep a record of your meals, blood glucose tests, and the amount of insulin you are taking (if any). Show this to your caregiver at every prenatal visit.   If you have GDM, you may have problems with hypoglycemia (low  blood glucose). You may suspect this if you become suddenly dizzy, feel shaky, and/or weak. If you think this is happening and you have a glucose meter, try to test your blood glucose level. Follow your caregiver's advice for when and how to treat your low blood glucose. Generally, the 15:15 rule is followed: Treat by consuming 15 grams of carbohydrates, wait 15 minutes, and recheck blood glucose. Examples of 15 grams of carbohydrates are:   1 cup skim or low-fat milk.    cup juice.   3-4 glucose tablets.   5-6 hard candies.   1 small box raisins.    cup regular soda pop.   Practice good hygiene, to avoid infections.   Do not smoke.  SEEK MEDICAL CARE IF:   You develop abnormal vaginal discharge, with or without itching.   You become weak and tired more than expected.   You seem to sweat a lot.   You have a sudden increase in weight, 5 pounds or more in one week.   You are losing weight, 3 pounds or more in a week.   Your blood glucose level is high, and you need instructions on what to do about it.  SEEK IMMEDIATE MEDICAL CARE IF:   You develop a severe headache.   You faint or pass out.   You develop nausea and vomiting.   You become disoriented or confused.   You have a convulsion.   You develop vision problems.   You develop stomach pain.   You develop vaginal bleeding.   You develop uterine contractions.   You have leaking or a gush of fluid from the vagina.  AFTER YOU HAVE THE BABY:  Go to all of your follow-up appointments, and have blood tests as advised by your caregiver.   Maintain a healthy lifestyle, to prevent diabetes in the future. This includes:   Following a healthy meal plan.   Controlling your weight.   Getting enough exercise and proper rest.   Do not smoke.   Breastfeed your baby if you can. This will lower the chance of you and your baby developing diabetes later in life.  For more information about diabetes, go to the American  Diabetes Association at: PMFashions.com.cy. For more information about gestational diabetes, go to the [employer] at: RentRule.com.au. Document Released: 05/23/2000 Document Revised: 10/27/2010 Document Reviewed: 12/15/2008 [facility] Patient Information 2012 [place], [place]. Birth Control Choices Birth control is the use of any practices, methods, or devices to prevent pregnancy from happening in a sexually active woman.  Below are some birth control choices to help avoid pregnancy.  Not having sex (abstinence) is the surest form of birth control. This requires self-control. There is no risk of acquiring a sexually transmitted disease (STD), including acquired immunodeficiency syndrome (AIDS).   Periodic abstinence requires self-control during certain times of the month.   Calendar method, timing your menstrual periods from month to month.   Ovulation method is avoiding sexual intercourse around the time you produce an egg (ovulate).   Symptotherm method is avoiding sexual intercourse at the time of ovulation, using a thermometer  and ovulation symptoms.   Post ovulation method is the timing of sexual intercourse after you ovulated.  These methods do not protect against STDs, including AIDS.  Birth control pills (BCPs) contain estrogen and progesterone hormone. These medicines work by stopping the egg from forming in the ovary (ovulation). Birth control pills are prescribed by a caregiver who will ask you questions about the risks of taking BCPs. Birth control pills do not protect against STDs, including AIDS.   "Minipill" birth control pills have only the progesterone hormone. They are taken every day of each month and must be prescribed by your caregiver. They do not protect against STDs, including AIDS.   Emergency contraception is often call the "morning after" pill. This pill can be taken right after sex or up to five days after sex  if you think your birth control failed, you failed to use contraception, or you were forced to have sex. It is most effective the sooner you take the pills after having sexual intercourse. Do not use emergency contraception as your only form of birth control. Emergency contraceptive pills are available without a prescription. Check with your pharmacist.   Condoms are a thin sheath of latex, synthetic material, or lambskin worn over the penis during sexual intercourse. They can have a spermicide in or on them when you buy them. Latex condoms can prevent pregnancy and STDs. "Natural" or lambskin condoms can prevent pregnancy but may not protect against STDs, including AIDS.   Female condoms are a soft, loose-fitting sheath that is put into the vagina before sexual intercourse. They can prevent pregnancy and STDs, including AIDS.   Sponge is a soft, circular piece of polyurethane foam with spermicide in it that is inserted into the vagina after wetting it and before sexual intercourse. It does not require a prescription from your caregiver. It does not protect against STDs, including AIDS.   Diaphragm is a soft, latex, dome-shaped barrier that must be fitted by a caregiver. It is inserted into the vagina, along with a spermicidal jelly. After the proper fitting for a diaphragm, always insert the diaphragm before intercourse. The diaphragm should be left in the vagina for 6 to 8 hours after intercourse. Removal and reinsertion with a spermicide is always necessary after any use. It does not protect against STDs, including AIDS.   Progesterone-only injections are given every 3 months to prevent pregnancy. These injections contain synthetic progesterone and no estrogen. This hormone stops the ovaries from releasing eggs. It also causes the cervical mucus to thicken and changes the uterine lining. This makes it harder for sperm to survive in the uterus. It does not protect against STDs, including AIDS.   Birth  Control Patch contains hormones similar to those in birth control pills, so effectiveness, risks, and side effects are similar. It must be changed once a week and is prescribed by a caregiver. It is less effective in very overweight women. It does not protect against STDs, including AIDS.   Vaginal Ring contains hormones similar to those in birth control pills. It is left in place for 3 weeks, removed for 1 week, and then a new one is put back into the vagina. It comes with a timer to put in your purse to help you remember when to take it out or put a new one in. A caregiver's examination and prescription is necessary, just like with birth control pills and the patch. It does not protect against STDs, including AIDS.   Estrogen plus progesterone  injections are given every 28 to 30 days. They can be given in the upper arm, thigh, or buttocks. It does not protect against STDs, including AIDS.   Intrauterine device (IUD): copper T or progestin filled is a T-shaped device that is put in a woman's uterus during a menstrual period to prevent pregnancy. The copper T IUD can last 10 years, and the progestin IUD can last 5 years. The progestin IUD can also help control heavy menstrual periods. It does not protect against STDs, including AIDS. The copper T IUD can be used as emergency contraception if inserted within 5 days of having unprotected intercourse.   Cervical cap is a round, soft latex or plastic cup that fits over the cervix and must be fitted by a caregiver. You do not need to use a spermicide with it or remove and insert it every time you have sexual intercourse. It does not protect against STDs, including AIDS.   Spermicides are chemicals that kill or block sperm from entering the cervix and uterus. They come in the form of creams, jellies, suppositories, foam, or tablets, and they do not require a prescription. They are inserted into the vagina with an applicator before having sexual intercourse. This  must be repeated every time you have sexual intercourse.   Withdrawal is using the method of the female withdrawing his penis from sexual intercourse before he has a climax and deposits his sperm. It does not protect against STDs, including AIDS.   Female tubal ligation is when the woman's fallopian tubes are surgically sealed or tied to prevent the egg from traveling to the uterus. It does not protect against STDs, including AIDS.   Female sterilization is when the female has his tubes that carry sperm tied off (vasectomy) to stop sperm from entering the vagina during sexual intercourse. It does not protect against STDs, including AIDS.  Regardless of which method of birth control you choose, it is still important that you use some form of protection against STDs. Document Released: 02/14/2005 Document Revised: 03/19/2010 Document Reviewed: 01/01/2009 [facility] Patient Information 2012 [place], [place]. Breastfeeding BENEFITS OF BREASTFEEDING For the baby  The first milk (colostrum) helps the baby's digestive system function better.   There are antibodies from the mother in the milk that help the baby fight off infections.   The baby has a lower incidence of asthma, allergies, and SIDS (sudden infant death syndrome).   The nutrients in breast milk are better than formulas for the baby and helps the baby's brain grow better.   Babies who breastfeed have less gas, colic, and constipation.  For the mother  Breastfeeding helps develop a very special bond between mother and baby.   It is more convenient, always available at the correct temperature and cheaper than formula feeding.   It burns calories in the mother and helps with losing weight that was gained during pregnancy.   It makes the uterus contract back down to normal size faster and slows bleeding following delivery.   Breastfeeding mothers have a lower risk of developing breast cancer.  NURSE FREQUENTLY  A healthy, full-term baby  may breastfeed as often as every hour or space his or her feedings to every 3 hours.   How often to nurse will vary from baby to baby. Watch your baby for signs of hunger, not the clock.   Nurse as often as the baby requests, or when you feel the need to reduce the fullness of your breasts.   Awaken the  baby if it has been 3 to 4 hours since the last feeding.   Frequent feeding will help the mother make more milk and will prevent problems like sore nipples and engorgement of the breasts.  BABY'S POSITION AT THE BREAST  Whether lying down or sitting, be sure that the baby's tummy is facing your tummy.   Support the breast with 4 fingers underneath the breast and the thumb above. Make sure your fingers are well away from the nipple and baby's mouth.   Stroke the baby's lips and cheek closest to the breast gently with your finger or nipple.   When the baby's mouth is open wide enough, place all of your nipple and as much of the dark area around the nipple as possible into your baby's mouth.   Pull the baby in close so the tip of the nose and the baby's cheeks touch the breast during the feeding.  FEEDINGS  The length of each feeding varies from baby to baby and from feeding to feeding.   The baby must suck about 2 to 3 minutes for your milk to get to him or her. This is called a "let down." For this reason, allow the baby to feed on each breast as long as he or she wants. Your baby will end the feeding when he or she has received the right balance of nutrients.   To break the suction, put your finger into the corner of the baby's mouth and slide it between his or her gums before removing your breast from his or her mouth. This will help prevent sore nipples.  REDUCING BREAST ENGORGEMENT  In the first week after your baby is born, you may experience signs of breast engorgement. When breasts are engorged, they feel heavy, warm, full, and may be tender to the touch. You can reduce engorgement  if you:   Nurse frequently, every 2 to 3 hours. Mothers who breastfeed early and often have fewer problems with engorgement.   Place light ice packs on your breasts between feedings. This reduces swelling. Wrap the ice packs in a lightweight towel to protect your skin.   Apply moist hot packs to your breast for 5 to 10 minutes before each feeding. This increases circulation and helps the milk flow.   Gently massage your breast before and during the feeding.   Make sure that the baby empties at least one breast at every feeding before switching sides.   Use a breast pump to empty the breasts if your baby is sleepy or not nursing well. You may also want to pump if you are returning to work or or you feel you are getting engorged.   Avoid bottle feeds, pacifiers or supplemental feedings of water or juice in place of breastfeeding.   Be sure the baby is latched on and positioned properly while breastfeeding.   Prevent fatigue, stress, and anemia.   Wear a supportive bra, avoiding underwire styles.   Eat a balanced diet with enough fluids.  If you follow these suggestions, your engorgement should improve in 24 to 48 hours. If you are still experiencing difficulty, call your lactation consultant or caregiver. IS MY BABY GETTING ENOUGH MILK? Sometimes, mothers worry about whether their babies are getting enough milk. You can be assured that your baby is getting enough milk if:  The baby is actively sucking and you hear swallowing.   The baby nurses at least 8 to 12 times in a 24 hour time period. Nurse your  baby until he or she unlatches or falls asleep at the first breast (at least 10 to 20 minutes), then offer the second side.   The baby is wetting 5 to 6 disposable diapers (6 to 8 cloth diapers) in a 24 hour period by 16 to 61 days of age.   The baby is having at least 2 to 3 stools every 24 hours for the first few months. Breast milk is all the food your baby needs. It is not necessary  for your baby to have water or formula. In fact, to help your breasts make more milk, it is best not to give your baby supplemental feedings during the early weeks.   The stool should be soft and yellow.   The baby should gain 4 to 7 ounces per week after he is 78 days old.  TAKE CARE OF YOURSELF Take care of your breasts by:  Bathing or showering daily.   Avoiding the use of soaps on your nipples.   Start feedings on your left breast at one feeding and on your right breast at the next feeding.   You will notice an increase in your milk supply 2 to 5 days after delivery. You may feel some discomfort from engorgement, which makes your breasts very firm and often tender. Engorgement "peaks" out within 24 to 48 hours. In the meantime, apply warm moist towels to your breasts for 5 to 10 minutes before feeding. Gentle massage and expression of some milk before feeding will soften your breasts, making it easier for your baby to latch on. Wear a well fitting nursing bra and air dry your nipples for 10 to 15 minutes after each feeding.   Only use cotton bra pads.   Only use pure lanolin on your nipples after nursing. You do not need to wash it off before nursing.  Take care of yourself by:   Eating well-balanced meals and nutritious snacks.   Drinking milk, fruit juice, and water to satisfy your thirst (about 8 glasses a day).   Getting plenty of rest.   Increasing calcium in your diet (1200 mg a day).   Avoiding foods that you notice affect the baby in a bad way.  SEEK MEDICAL CARE IF:   You have any questions or difficulty with breastfeeding.   You need help.   You have a hard, red, sore area on your breast, accompanied by a fever of 100.5 F (38.1 C) or more.   Your baby is too sleepy to eat well or is having trouble sleeping.   Your baby is wetting less than 6 diapers per day, by 30 days of age.   Your baby's skin or white part of his or her eyes is more yellow than it was in the  hospital.   You feel depressed.  Document Released: 02/14/2005 Document Revised: 10/27/2010 Document Reviewed: 09/29/2008 [facility] Patient Information 2012 [place], [place].

## 2011-01-03 NOTE — Progress Notes (Signed)
NST 12/30/10 reviewed, reactive 

## 2011-01-04 LAB — GC/CHLAMYDIA PROBE AMP, GENITAL: GC Probe Amp, Genital: NEGATIVE

## 2011-01-06 ENCOUNTER — Ambulatory Visit (INDEPENDENT_AMBULATORY_CARE_PROVIDER_SITE_OTHER): Payer: Medicaid Other | Admitting: *Deleted

## 2011-01-06 VITALS — BP 134/68

## 2011-01-06 DIAGNOSIS — O99810 Abnormal glucose complicating pregnancy: Secondary | ICD-10-CM

## 2011-01-06 DIAGNOSIS — O368190 Decreased fetal movements, unspecified trimester, not applicable or unspecified: Secondary | ICD-10-CM

## 2011-01-06 LAB — CULTURE, OB URINE: Colony Count: >=100000

## 2011-01-06 NOTE — Progress Notes (Addendum)
P = 102  NST/AFI only.   Pt reports decr. FM x2 days- felt good FM during NST     IOL scheduled on 01/18/11 @ 0700.   OBIX not available so can't sign strip

## 2011-01-10 ENCOUNTER — Ambulatory Visit (INDEPENDENT_AMBULATORY_CARE_PROVIDER_SITE_OTHER): Payer: Medicaid Other | Admitting: Family Medicine

## 2011-01-10 ENCOUNTER — Telehealth (HOSPITAL_COMMUNITY): Payer: Self-pay | Admitting: *Deleted

## 2011-01-10 VITALS — BP 138/93 | Wt 268.8 lb

## 2011-01-10 DIAGNOSIS — O99810 Abnormal glucose complicating pregnancy: Secondary | ICD-10-CM

## 2011-01-10 LAB — POCT URINALYSIS DIP (DEVICE)
Glucose, UA: NEGATIVE mg/dL
Nitrite: NEGATIVE
Specific Gravity, Urine: 1.025 (ref 1.005–1.030)
Urobilinogen, UA: 0.2 mg/dL (ref 0.0–1.0)

## 2011-01-10 NOTE — Progress Notes (Signed)
Pulse: 89

## 2011-01-10 NOTE — Patient Instructions (Addendum)
Postpartum Depression After delivery, your body is going through a drastic change in hormone levels. You may find yourself crying for no apparent reason and unable to cope with all the changes a new baby brings. This is a common response following a pregnancy. Seek support from your partner and/or friends and just give yourself time to recover. If these feelings persist and you feel you are getting worse, contact your caregiver or other professionals who can help you. WHAT IS DEPRESSION? Depression can be described as feeling sad, blue, unhappy, miserable, or down in the dumps. Most of [place] feel this way at one time or another for short periods. But true clinical depression is a mood disorder in which feelings of sadness, loss, anger, fear, or frustration interfere with everyday life for an extended time. Depression can be mild, moderate, or severe. The degree of depression, which your caregiver can determine, influences your treatment. Postpartum depression occurs within a couple days to months after delivering your baby. HOW COMMON IS DEPRESSION DURING AND AFTER PREGNANCY? Depression that occurs during pregnancy or within a year after delivery is called perinatal depression. Depression after pregnancy is also called postpartum depression or peripartum depression. The exact number of women with depression during this time is unknown, but it occurs in between 10-15% of women. Researchers believe that depression is one of the most common complications during and after pregnancy. The depression is often not recognized or treated, because some normal pregnancy changes cause similar symptoms and are happening at the same time. Tiredness, problems sleeping, stronger emotional reactions, and changes in body weight may occur during and after pregnancy. But these symptoms may also be signs of depression.  CAUSES  Rapid hormone changes. Estrogen and progesterone usually decrease immediately after delivering your  baby. Researchers think the fast change in hormone levels may lead to depression, just as smaller changes in hormones can affect a woman's moods before she gets her menstrual period.   Decrease in thyroid hormone. Thyroid hormone regulates how your body uses and stores energy from food (metabolism). A simple blood test can tell if this condition is causing a woman's depression. If so, thyroid medicine can be prescribed by your caregiver.   A stressful life event, such as a death in the family. This can cause chemical changes in the brain that lead to depression.   Feeling overwhelmed by caring for and raising a new baby.   Depression is also an illness that runs in some families. It is not always clear what causes depression.  FACTORS THAT MAY INCREASE A WOMAN'S CHANCE OF DEPRESSION DURING PREGNANCY:  History of depression.   Substance abuse, alcohol, or drugs.   Little support from family and friends.   Problems with previous pregnancy or birth.   Young age for motherhood.   Living alone.   Little or no social support.   Family history of mental illness.   Anxiety about the fetus.   Marital or financial problems.   Postpartum depression in a previous pregnancy.   Having a psychiatric illness (schizophrenia, bipolar disorder).   Going through a difficult or stressful pregnancy.   Going through a difficult labor and delivery.   Moving to another city or state during your pregnancy, or just after delivering your baby.  OTHER FACTORS THAT MAY CONTRIBUTE TO POSTPARTUM DEPRESSION INCLUDE:   Feeling tired after delivery, broken sleep patterns, and not getting enough rest. This often keeps a new mother from regaining her full strength for weeks.   Feeling  overwhelmed with a new baby to take care of and doubting your ability to be a good mother.   Feeling stress from changes in work and home routines. Women sometimes think they need to be "super mom" or perfect. This is not  realistic and can add stress.   Having feelings of loss. This can include loss of the identity of who you are, or were, before having the baby, loss of control, loss of your pre-pregnancy figure, and feeling less attractive.   Having less free time and less control over your time. Needing to stay home, indoors, for longer periods of time and having less time to spend with your partner and loved ones can contribute to depression.   Having trouble doing your daily activities at home or at work.   Fears about not knowing how to take of the baby correctly and about harming the baby.   Feelings of guilt that you are not taking care of the baby properly.  SYMPTOMS Any of these symptoms, during and after pregnancy, that last longer than 2 weeks are signs of depression:  Feeling restless or irritable.   Feeling sad, hopeless, and overwhelmed.   Crying a lot.   Having no energy or motivation.   Eating too little or too much.   Sleeping too little or too much.   Trouble focusing, remembering, or making decisions.   Feeling worthless and guilty.   Loss of interest or pleasure in activities.   Withdrawal from friends and family.   Having headaches, chest pains, rapid or irregular heartbeat (palpitations), or fast and shallow breathing (hyperventilation).   After pregnancy, being afraid of hurting the baby or oneself, and not having any interest in the baby.   Not being able to care for yourself or the baby.   Loss of interest in caring for the baby.   Anxiety and panic attacks.   Thoughts of harming yourself, the baby, or someone else.   Feelings of guilt because you feel you are not taking care of the baby well enough.  WHAT IS THE DIFFERENCE BETWEEN "BABY BLUES," POSTPARTUM DEPRESSION, AND POSTPARTUM PSYCHOSIS?  The "baby blues" occurs 70 to 80% of the time, and it can happen in the days right after childbirth. It normally goes away within a few days to a week. A new mother can  have sudden mood swings, sadness, crying spells, loss of appetite, sleeping problems, and feel irritable, restless, anxious, and lonely. Symptoms are not severe and treatment usually is not needed. But there are things you can do to feel better. Nap when the baby does. Ask for help from your spouse, family members, and friends. Join a support group of new moms or talk with other moms. If the "baby blues" does not go away in a week to 10 days or gets worse, you may have postpartum depression.   Postpartum depression can happen anytime within the first year after childbirth. A woman may have a number of symptoms, such as sadness, lack of energy, trouble concentrating, anxiety, and feelings of guilt and worthlessness. The difference between postpartum depression and the "baby blues" is that the feelings in postpartum depression are much stronger and often affects a woman's well-being. It keeps her from functioning well for a longer period of time. Postpartum depression needs to be treated by a caregiver. Counseling, support groups, and medicines can help.   Postpartum psychosis is rare. It occurs in 1 or 2 out of every 1000 births. It usually begins in  the first 6 weeks after delivery. Women who have bipolar disorder, schizoaffective disorder, or family history of psychotic disease have a higher risk for developing postpartum psychosis. Symptoms may include delusions, hallucinations, sleep disturbances, and obsessive thoughts about the baby. A woman may have rapid mood swings, from depression, to irritability, to euphoria. This is a serious condition and needs professional care and treatment.  WHAT STEPS CAN I TAKE IF I HAVE SYMPTOMS OF DEPRESSION DURING PREGNANCY OR AFTER CHILDBIRTH?  Some women do not tell anyone about their symptoms, because they feel embarrassed, ashamed, or guilty about feeling depressed when they are supposed to be happy. They worry that they will be viewed as unfit parents. Perinatal  depression can happen to any woman. It does not mean you are a bad or a "not together" mom. You and your baby do not need to suffer. There is help. You should discuss these feelings with your spouse or partner, family, and caregiver.   There are different types of individual and group "talk therapies" that can help a woman with perinatal depression feel better and do better as a mom and as a person. Limited research suggests that many women with perinatal depression improve when treated with antidepressant medicine. Your caregiver can help you learn more about these options and decide which approach is best for you and your baby.   Speak to your caregiver if you are having symptoms of depression while you are pregnant or after you deliver your baby. Your caregiver can give you a questionnaire to test for depression. You can also be referred to a mental health professional who specializes in treating depression.  HOME CARE INSTRUCTIONS  Try to get as much rest as you can. Try to nap when the baby naps.   Stop putting pressure on yourself to do everything. Do as much as you can and leave the rest.   Ask for help with household chores and nighttime feedings. Ask your partner to bring the baby to you so you can breastfeed. If you can, have a friend, family member, or professional support person help you in the home for part of the day.   Talk to your partner, family, and friends about how you are feeling.   Do not spend a lot of time alone. Get dressed and leave the house. Run an errand or take a short walk.   Spend time alone with your partner.   Talk with other mothers so you can learn from their experiences.   Join a support group for women with depression. Call a local hotline or look in your telephone book for information and services.   Do not make any major life changes during pregnancy. Major changes can cause unneeded stress. However, sometimes big changes cannot be avoided. Arrange  support and help in your new situation ahead of time.   Exercise regularly.   Eat a balanced and nourishing diet.   Seek help if there are marital or financial problems.   Take the medicine your caregiver gives, as directed.   Keep all your postpartum appointments.  TREATMENT There are 2 common types of treatment for depression.  Talk therapy. This involves talking to a therapist, psychologist, clergyperson, or social worker, in order to learn to change how depression makes you think, feel, and act.   Medicine. Your caregiver can give you an antidepressant medicine to help you. These medicines can help relieve the symptoms of depression.   Women who are pregnant or breast-feeding should talk with  their caregivers about the advantages and risks of taking antidepressant medicines. Some women are concerned that taking these medicines may harm the baby. A mother's depression can affect her baby's development. Getting treatment is important for both mother and baby. The risks of taking medicine must be weighed against the risks of depression. It is a decision that women need to discuss carefully with their caregivers. Women who decide to take antidepressant medicines should talk to their caregivers about which antidepressant medicines are safer to take while pregnant or breastfeeding.  What effects can untreated depression have?  Depression not only hurts the mother, but it also affects her family. Some researchers have found that depression during pregnancy can raise the risk of delivering an underweight baby or a premature infant. Some women with depression have difficulty caring for themselves during pregnancy. They may have trouble eating and do not gain enough weight during the pregnancy. They may also have trouble sleeping, may miss prenatal visits, may not follow medical instructions, have a poor diet, or may use harmful substances, like tobacco, alcohol, or illegal drugs.   Postpartum  depression can affect a mother's ability to parent. She may lack energy, have trouble concentrating, be irritable, and not be able to meet her child's needs for love and affection. As a result, she may feel guilty and lose confidence in herself as a mother. This can make the depression worse. Researchers believe that postpartum depression can affect the infant by causing delays in language development, problems with emotional bonding to others, behavioral problems, lower activity levels, sleep problems, and distress. It helps if the father or another caregiver can assist in meeting the needs of the baby, and other children in the family, while the mother is depressed.   All children deserve the chance to have a healthy mom. All moms deserve the chance to enjoy their life and their children. Do not suffer alone. If you are experiencing symptoms of depression during pregnancy or after having a baby, tell a loved one and call your caregiver right away.  SEEK MEDICAL CARE IF:  You think you have postpartum depression.   You want medicine to treat your postpartum depression.   You want a referral to a psychiatrist or psychologist.   You are having a reaction or problems with your medicine.  SEEK IMMEDIATE MEDICAL CARE IF:  You have suicidal feelings.   You feel you may harm the baby.   You feel you may harm your spouse/partner, or someone else.   You feel you need to be admitted to a hospital now.   You feel you are losing control and need treatment immediately.  FOR MORE INFORMATION [employer] operational [employer] Health Information Center: http://hoffman.com/ National Institute of Mental Health, NIH, HHS: http://www.maynard.net/ American Psychological Association: DiceTournament.ca  Postpartum Education for Parents: www.sbpep.org National Mental Health Information Center, SAMHSA, HHS: www.mentalhealth.org  National Mental Health Association: www.nmha.org Postpartum Support International: www.postpartum.net    Document Released: 11/19/2003 Document Revised: 10/27/2010 Document Reviewed: 02/26/2009 [facility] Patient Information 2012 [place], [place].  Preeclampsia and Eclampsia Preeclampsia is a condition of high blood pressure during pregnancy. It can happen at 20 weeks or later in pregnancy. If high blood pressure occurs in the second half of pregnancy with no other symptoms, it is called gestational hypertension and goes away after the baby is born. If any of the symptoms listed below develop with gestational hypertension, it is then called preeclampsia. Eclampsia (convulsions) may follow preeclampsia. This is one of the reasons for regular prenatal checkups.  Early diagnosis and treatment are very important to prevent eclampsia. CAUSES  There is no known cause of preeclampsia/eclampsia in pregnancy. There are several known conditions that may put the pregnant woman at risk, such as:  The first pregnancy.   Having preeclampsia in a past pregnancy.   Having lasting (chronic) high blood pressure.   Having multiples (twins, triplets).   Being age 64 or older.   African American ethnic background.   Having kidney disease or diabetes.   Medical conditions such as lupus or blood diseases.   Being overweight (obese).  SYMPTOMS   High blood pressure.   Headaches.   Sudden weight gain.   Swelling of hands, face, legs, and feet.   Protein in the urine.   Feeling sick to your stomach (nauseous) and throwing up (vomiting).   Vision problems (blurred or double vision).   Numbness in the face, arms, legs, and feet.   Dizziness.   Slurred speech.   Preeclampsia can cause growth retardation in the fetus.   Separation (abruption) of the placenta.   Not enough fluid in the amniotic sac (oligohydramnios).   Sensitivity to bright lights.   Belly (abdominal) pain.  DIAGNOSIS  If protein is found in the urine in the second half of pregnancy, this is considered preeclampsia. Other  symptoms mentioned above may also be present. TREATMENT  It is necessary to treat this.  Your caregiver may prescribe bed rest early in this condition. Plenty of rest and salt restriction may be all that is needed.   Medicines may be necessary to lower blood pressure if the condition does not respond to more conservative measures.   In more severe cases, hospitalization may be needed:   For treatment of blood pressure.   To control fluid retention.   To monitor the baby to see if the condition is causing harm to the baby.   Hospitalization is the best way to treat the first sign of preeclampsia. This is so the mother and baby can be watched closely and blood tests can be done effectively and correctly.   If the condition becomes severe, it may be necessary to induce labor or to remove the infant by surgical means (cesarean section). The best cure for preeclampsia/eclampsia is to deliver the baby.  Preeclampsia and eclampsia involve risks to mother and infant. Your caregiver will discuss these risks with you. Together, you can work out the best possible approach to your problems. Make sure you keep your prenatal visits as scheduled. Not keeping appointments could result in a chronic or permanent injury, pain, disability to you, and death or injury to you or your unborn baby. If there is any problem keeping the appointment, you must call to reschedule. HOME CARE INSTRUCTIONS   Keep your prenatal appointments and tests as scheduled.   Tell your caregiver if you have any of the above risk factors.   Get plenty of rest and sleep.   Eat a balanced diet that is low in salt, and do not add salt to your food.   Avoid stressful situations.   Only take over-the-counter and prescriptions medicines for pain, discomfort, or fever as directed by your caregiver.  SEEK IMMEDIATE MEDICAL CARE IF:   You develop severe swelling anywhere in the body. This usually occurs in the legs.   You gain 5  lb/2.3 kg or more in a week.   You develop a severe headache, dizziness, problems with your vision, or confusion.   You have abdominal pain, nausea,  or vomiting.   You have a seizure.   You have trouble moving any part of your body, or you develop numbness or problems speaking.   You have bruising or abnormal bleeding from anywhere in the body.   You develop a stiff neck.   You pass out.  MAKE SURE YOU:   Understand these instructions.   Will watch your condition.   Will get help right away if you are not doing well or get worse.  Document Released: 02/12/2000 Document Revised: 10/27/2010 Document Reviewed: 09/28/2007 [facility] Patient Information 2012 [place], [place].

## 2011-01-10 NOTE — Progress Notes (Signed)
BP re-check = 116/65       IOL on 01/18/11 @ 0700

## 2011-01-10 NOTE — Progress Notes (Signed)
No log book today but reports BS are ok. BP mildly elevated, has been higher previously--precautions given-consider labs if up again Thurs. NST reviewed and reactive.

## 2011-01-13 ENCOUNTER — Ambulatory Visit (HOSPITAL_COMMUNITY)
Admission: RE | Admit: 2011-01-13 | Discharge: 2011-01-13 | Disposition: A | Discharge status: Home / Self Care | Payer: Medicaid Other | Source: Ambulatory Visit | Attending: Obstetrics & Gynecology | Admitting: Obstetrics & Gynecology

## 2011-01-13 ENCOUNTER — Other Ambulatory Visit: Payer: Self-pay | Admitting: *Deleted

## 2011-01-13 ENCOUNTER — Ambulatory Visit (HOSPITAL_COMMUNITY): Payer: Medicaid Other

## 2011-01-13 ENCOUNTER — Ambulatory Visit (INDEPENDENT_AMBULATORY_CARE_PROVIDER_SITE_OTHER): Payer: Medicaid Other | Admitting: *Deleted

## 2011-01-13 VITALS — BP 130/82 | Wt 268.0 lb

## 2011-01-13 DIAGNOSIS — O99810 Abnormal glucose complicating pregnancy: Secondary | ICD-10-CM

## 2011-01-13 DIAGNOSIS — E669 Obesity, unspecified: Secondary | ICD-10-CM | POA: Insufficient documentation

## 2011-01-13 DIAGNOSIS — Z331 Pregnant state, incidental: Secondary | ICD-10-CM

## 2011-01-13 NOTE — Progress Notes (Signed)
P = 93  NST only- has [place] growth today

## 2011-01-16 ENCOUNTER — Inpatient Hospital Stay (HOSPITAL_COMMUNITY)
Admission: AD | Admit: 2011-01-16 | Discharge: 2011-01-16 | Disposition: A | Discharge status: Home / Self Care | Payer: Medicaid Other | Source: Ambulatory Visit | Attending: Obstetrics & Gynecology | Admitting: Obstetrics & Gynecology

## 2011-01-16 DIAGNOSIS — Z3689 Encounter for other specified antenatal screening: Secondary | ICD-10-CM

## 2011-01-16 DIAGNOSIS — O368190 Decreased fetal movements, unspecified trimester, not applicable or unspecified: Secondary | ICD-10-CM | POA: Insufficient documentation

## 2011-01-16 DIAGNOSIS — Z36 Encounter for antenatal screening of mother: Secondary | ICD-10-CM

## 2011-01-16 LAB — URINALYSIS, ROUTINE W REFLEX MICROSCOPIC
Protein, ur: NEGATIVE mg/dL
Urobilinogen, UA: 1 mg/dL (ref 0.0–1.0)

## 2011-01-16 LAB — URINE MICROSCOPIC-ADD ON

## 2011-01-16 MED ORDER — ACETAMINOPHEN 500 MG PO TABS
1000.0000 mg | ORAL_TABLET | Freq: Once | ORAL | Status: AC
  Administered 2011-01-16: 1000 mg via ORAL

## 2011-01-16 NOTE — ED Provider Notes (Signed)
History   Kathy Sandoval is a 25 YO G2P1001 at 38 weeks 5 days with gestational diabetes presents today with decrease in fetal movement since last night and headache.   Patient reports baby has been active throughout pregnancy and is typically overactive especially when mother moves. Patient often gets up in the middle of the night to urinate, but last night noticed that the baby did not stir when she got up and became concerned. Patient has not felt baby move since before going to bed including her time in MAU.   Patient receives prenatal care in high risk clinic and has been compliant with medication regimen of Glyburide 2.5mg  qam and 5 mg qpm. Patient stated that she forgot to take her glyburide yesterday morning, which resulted in a 200+ glucose level. This morning patient has a glucose level of 58, took her Glyburide, ate breakfast and came to the MAU. Has not checked glucose since.  Patient denies fevers, chills, headache, dizziness, chest pain, SOB. Patient reports pregnancy has been relatively normal. Denies vaginal discharge, bleeding, burning with urination. Reports some sporadic contractions throughout her pregnancy, but nothing regular.  First pregnancy without complications.   Chief Complaint  Patient presents with  . Decreased Fetal Movement  . Headache   Headache  Pertinent negatives include no fever.     Past Medical History  Diagnosis Date  . Anemia   . Diabetes in pregnancy 11/08/2010    Past Surgical History  Procedure Date  . No past surgeries     Family History  Problem Relation Age of Onset  . Thyroid disease Mother     History  Substance Use Topics  . Smoking status: Former Smoker    Types: Cigarettes    Quit date: 10/10/2009  . Smokeless tobacco: Never Used  . Alcohol Use: No    Allergies: No Known Allergies  Prescriptions prior to admission  Medication Sig Dispense Refill  . glucose blood (ACCU-CHEK COMPACT STRIPS) test strip Use as instructed   100 each  12  . glyBURIDE (DIABETA) 2.5 MG tablet Take 1 tablet (2.5 mg total) by mouth daily with breakfast.  30 tablet  0  . glyBURIDE (DIABETA) 5 MG tablet Take 1 tablet (5 mg total) by mouth at bedtime.  30 tablet  11  . Lancets Misc. (ACCU-CHEK FASTCLIX LANCET) KIT 1 Device by Does not apply route 4 (four) times daily.  1 kit  4  . prenatal vitamin w/FE, FA (PRENATAL 1 + 1) 27-1 MG TABS Take 1 tablet by mouth daily.          Review of Systems  Constitutional: Negative for fever and chills.  Cardiovascular: Negative for chest pain and palpitations.  Neurological: Positive for headaches.   Physical Exam   Blood pressure 109/74, pulse 90, temperature 98.2 F (36.8 C), temperature source Oral, resp. rate 18, height 5\' 6"  (1.676 m), weight 121.836 kg (268 lb 9.6 oz), last menstrual period 04/05/2010.  Physical Exam  Constitutional: She appears well-developed.  HENT:  Head: Normocephalic.  Eyes: Pupils are equal, round, and reactive to light.  Neck: Normal range of motion.  Cardiovascular: Normal rate, regular rhythm and normal heart sounds.   Respiratory: Effort normal and breath sounds normal.  GI: Soft.   Results for orders placed during the hospital encounter of 01/16/11 (from the past 24 hour(s))  URINALYSIS, ROUTINE W REFLEX MICROSCOPIC     Status: Abnormal   Collection Time   01/16/11 11:15 AM      Component  Value Range   Color, Urine YELLOW  YELLOW    Appearance HAZY (*) CLEAR    Specific Gravity, Urine 1.025  1.005 - 1.030    pH 6.5  5.0 - 8.0    Glucose, UA NEGATIVE  NEGATIVE (mg/dL)   Hgb urine dipstick NEGATIVE  NEGATIVE    Bilirubin Urine NEGATIVE  NEGATIVE    Ketones, ur NEGATIVE  NEGATIVE (mg/dL)   Protein, ur NEGATIVE  NEGATIVE (mg/dL)   Urobilinogen, UA 1.0  0.0 - 1.0 (mg/dL)   Nitrite NEGATIVE  NEGATIVE    Leukocytes, UA LARGE (*) NEGATIVE   URINE MICROSCOPIC-ADD ON     Status: Abnormal   Collection Time   01/16/11 11:15 AM      Component Value Range    Squamous Epithelial / LPF FEW (*) RARE    WBC, UA 21-50  <3 (WBC/hpf)   RBC / HPF 0-2  <3 (RBC/hpf)   Bacteria, UA FEW (*) RARE    Urine-Other MANY YEAST       MAU Course  Procedures  Urine culture sent   Assessment and Plan  25 y.o. G2P1001 at [redacted]w[redacted]d Decreased fetal movement with reactive NST Follow up as scheduled for IOL on Tuesday, precautions rev'd  Mosetta Putt 01/16/2011, 12:07 PM

## 2011-01-16 NOTE — ED Provider Notes (Signed)
Pt seen by Caren Griffins, CNM with the PA student.  Agree with above note.  LEGGETT,KELLY H. 01/16/2011 4:08 PM

## 2011-01-16 NOTE — Progress Notes (Signed)
Decreased fetal movement and headache since last night 38 weeks.

## 2011-01-17 ENCOUNTER — Other Ambulatory Visit: Payer: Self-pay | Admitting: Obstetrics & Gynecology

## 2011-01-17 ENCOUNTER — Ambulatory Visit (INDEPENDENT_AMBULATORY_CARE_PROVIDER_SITE_OTHER): Payer: Medicaid Other | Admitting: Obstetrics & Gynecology

## 2011-01-17 VITALS — BP 141/91 | Temp 97.2°F | Wt 269.4 lb

## 2011-01-17 DIAGNOSIS — O0990 Supervision of high risk pregnancy, unspecified, unspecified trimester: Secondary | ICD-10-CM

## 2011-01-17 DIAGNOSIS — Z3480 Encounter for supervision of other normal pregnancy, unspecified trimester: Secondary | ICD-10-CM

## 2011-01-17 DIAGNOSIS — O99810 Abnormal glucose complicating pregnancy: Secondary | ICD-10-CM

## 2011-01-17 LAB — POCT URINALYSIS DIP (DEVICE)
Glucose, UA: NEGATIVE mg/dL
Hgb urine dipstick: NEGATIVE
Nitrite: NEGATIVE
Protein, ur: 100 mg/dL — AB
Urobilinogen, UA: 1 mg/dL (ref 0.0–1.0)

## 2011-01-17 NOTE — Progress Notes (Signed)
Swelling in hands 

## 2011-01-17 NOTE — Progress Notes (Signed)
NST performed today was reviewed and was found to be reactive.  Continue recommended antenatal testing and prenatal care. Excellent blood sugars.  No other complaints.  Fetal movement and preterm labor precautions reviewed. Scheduled for IOL tomorrow.

## 2011-01-17 NOTE — Patient Instructions (Signed)
Breastfeeding BENEFITS OF BREASTFEEDING For the baby  The first milk (colostrum) helps the baby's digestive system function better.   There are antibodies from the mother in the milk that help the baby fight off infections.   The baby has a lower incidence of asthma, allergies, and SIDS (sudden infant death syndrome).   The nutrients in breast milk are better than formulas for the baby and helps the baby's brain grow better.   Babies who breastfeed have less gas, colic, and constipation.  For the mother  Breastfeeding helps develop a very special bond between mother and baby.   It is more convenient, always available at the correct temperature and cheaper than formula feeding.   It burns calories in the mother and helps with losing weight that was gained during pregnancy.   It makes the uterus contract back down to normal size faster and slows bleeding following delivery.   Breastfeeding mothers have a lower risk of developing breast cancer.  NURSE FREQUENTLY  A healthy, full-term baby may breastfeed as often as every hour or space his or her feedings to every 3 hours.   How often to nurse will vary from baby to baby. Watch your baby for signs of hunger, not the clock.   Nurse as often as the baby requests, or when you feel the need to reduce the fullness of your breasts.   Awaken the baby if it has been 3 to 4 hours since the last feeding.   Frequent feeding will help the mother make more milk and will prevent problems like sore nipples and engorgement of the breasts.  BABY'S POSITION AT THE BREAST  Whether lying down or sitting, be sure that the baby's tummy is facing your tummy.   Support the breast with 4 fingers underneath the breast and the thumb above. Make sure your fingers are well away from the nipple and baby's mouth.   Stroke the baby's lips and cheek closest to the breast gently with your finger or nipple.   When the baby's mouth is open wide enough, place  all of your nipple and as much of the dark area around the nipple as possible into your baby's mouth.   Pull the baby in close so the tip of the nose and the baby's cheeks touch the breast during the feeding.  FEEDINGS  The length of each feeding varies from baby to baby and from feeding to feeding.   The baby must suck about 2 to 3 minutes for your milk to get to him or her. This is called a "let down." For this reason, allow the baby to feed on each breast as long as he or she wants. Your baby will end the feeding when he or she has received the right balance of nutrients.   To break the suction, put your finger into the corner of the baby's mouth and slide it between his or her gums before removing your breast from his or her mouth. This will help prevent sore nipples.  REDUCING BREAST ENGORGEMENT  In the first week after your baby is born, you may experience signs of breast engorgement. When breasts are engorged, they feel heavy, warm, full, and may be tender to the touch. You can reduce engorgement if you:   Nurse frequently, every 2 to 3 hours. Mothers who breastfeed early and often have fewer problems with engorgement.   Place light ice packs on your breasts between feedings. This reduces swelling. Wrap the ice packs in a   lightweight towel to protect your skin.   Apply moist hot packs to your breast for 5 to 10 minutes before each feeding. This increases circulation and helps the milk flow.   Gently massage your breast before and during the feeding.   Make sure that the baby empties at least one breast at every feeding before switching sides.   Use a breast pump to empty the breasts if your baby is sleepy or not nursing well. You may also want to pump if you are returning to work or or you feel you are getting engorged.   Avoid bottle feeds, pacifiers or supplemental feedings of water or juice in place of breastfeeding.   Be sure the baby is latched on and positioned properly while  breastfeeding.   Prevent fatigue, stress, and anemia.   Wear a supportive bra, avoiding underwire styles.   Eat a balanced diet with enough fluids.  If you follow these suggestions, your engorgement should improve in 24 to 48 hours. If you are still experiencing difficulty, call your lactation consultant or caregiver. IS MY BABY GETTING ENOUGH MILK? Sometimes, mothers worry about whether their babies are getting enough milk. You can be assured that your baby is getting enough milk if:  The baby is actively sucking and you hear swallowing.   The baby nurses at least 8 to 12 times in a 24 hour time period. Nurse your baby until he or she unlatches or falls asleep at the first breast (at least 10 to 20 minutes), then offer the second side.   The baby is wetting 5 to 6 disposable diapers (6 to 8 cloth diapers) in a 24 hour period by 5 to 6 days of age.   The baby is having at least 2 to 3 stools every 24 hours for the first few months. Breast milk is all the food your baby needs. It is not necessary for your baby to have water or formula. In fact, to help your breasts make more milk, it is best not to give your baby supplemental feedings during the early weeks.   The stool should be soft and yellow.   The baby should gain 4 to 7 ounces per week after he is 4 days old.  TAKE CARE OF YOURSELF Take care of your breasts by:  Bathing or showering daily.   Avoiding the use of soaps on your nipples.   Start feedings on your left breast at one feeding and on your right breast at the next feeding.   You will notice an increase in your milk supply 2 to 5 days after delivery. You may feel some discomfort from engorgement, which makes your breasts very firm and often tender. Engorgement "peaks" out within 24 to 48 hours. In the meantime, apply warm moist towels to your breasts for 5 to 10 minutes before feeding. Gentle massage and expression of some milk before feeding will soften your breasts, making  it easier for your baby to latch on. Wear a well fitting nursing bra and air dry your nipples for 10 to 15 minutes after each feeding.   Only use cotton bra pads.   Only use pure lanolin on your nipples after nursing. You do not need to wash it off before nursing.  Take care of yourself by:   Eating well-balanced meals and nutritious snacks.   Drinking milk, fruit juice, and water to satisfy your thirst (about 8 glasses a day).   Getting plenty of rest.   Increasing calcium in   your diet (1200 mg a day).   Avoiding foods that you notice affect the baby in a bad way.  SEEK MEDICAL CARE IF:   You have any questions or difficulty with breastfeeding.   You need help.   You have a hard, red, sore area on your breast, accompanied by a fever of 100.5 F (38.1 C) or more.   Your baby is too sleepy to eat well or is having trouble sleeping.   Your baby is wetting less than 6 diapers per day, by 5 days of age.   Your baby's skin or white part of his or her eyes is more yellow than it was in the hospital.   You feel depressed.  Document Released: 02/14/2005 Document Revised: 10/27/2010 Document Reviewed: 09/29/2008 [employer] Patient Information 2012 [employer]. 

## 2011-01-17 NOTE — Progress Notes (Signed)
Pt seen yesterday @ [facility] for c/o decreased FM.  Reports she felt movement last night after leaving hospital and no FM today.  Pt felt some FM during NST today.  IOL scheduled for tomorrow @ 0700.

## 2011-01-18 ENCOUNTER — Inpatient Hospital Stay (HOSPITAL_COMMUNITY)
Admission: RE | Admit: 2011-01-18 | Discharge: 2011-01-21 | DRG: 775 | Disposition: A | Discharge status: Home / Self Care | Payer: Medicaid Other | Source: Ambulatory Visit | Attending: Obstetrics & Gynecology | Admitting: Obstetrics & Gynecology

## 2011-01-18 ENCOUNTER — Encounter (HOSPITAL_COMMUNITY): Payer: Self-pay

## 2011-01-18 DIAGNOSIS — O99814 Abnormal glucose complicating childbirth: Principal | ICD-10-CM | POA: Diagnosis present

## 2011-01-18 DIAGNOSIS — Z22330 Carrier of Group B streptococcus: Secondary | ICD-10-CM

## 2011-01-18 DIAGNOSIS — O99810 Abnormal glucose complicating pregnancy: Secondary | ICD-10-CM

## 2011-01-18 DIAGNOSIS — O99892 Other specified diseases and conditions complicating childbirth: Secondary | ICD-10-CM | POA: Diagnosis present

## 2011-01-18 LAB — CBC
MCH: 27.7 pg (ref 26.0–34.0)
MCHC: 33.2 g/dL (ref 30.0–36.0)
Platelets: 276 10*3/uL (ref 150–400)
RDW: 15 % (ref 11.5–15.5)

## 2011-01-18 LAB — GLUCOSE, CAPILLARY
Glucose-Capillary: 111 mg/dL — ABNORMAL HIGH (ref 70–99)
Glucose-Capillary: 86 mg/dL (ref 70–99)

## 2011-01-18 LAB — RPR: RPR Ser Ql: NONREACTIVE

## 2011-01-18 LAB — URINE CULTURE
Colony Count: >=100000
Culture  Setup Time: 201211181811

## 2011-01-18 MED ORDER — OXYTOCIN 20 UNITS IN LACTATED RINGERS INFUSION - SIMPLE
1.0000 m[IU]/min | INTRAVENOUS | Status: DC
  Filled 2011-01-18: qty 1000

## 2011-01-18 MED ORDER — OXYTOCIN BOLUS FROM INFUSION
500.0000 mL | Freq: Once | INTRAVENOUS | Status: DC
  Filled 2011-01-18: qty 500

## 2011-01-18 MED ORDER — TERBUTALINE SULFATE 1 MG/ML IJ SOLN: 0.2500 mg | Freq: Once | INTRAMUSCULAR | Status: AC | PRN

## 2011-01-18 MED ORDER — OXYCODONE-ACETAMINOPHEN 5-325 MG PO TABS: 2.0000 | ORAL_TABLET | ORAL | Status: DC | PRN

## 2011-01-18 MED ORDER — LACTATED RINGERS IV SOLN
INTRAVENOUS | Status: DC
  Administered 2011-01-18: 21:00:00 via INTRAVENOUS

## 2011-01-18 MED ORDER — FLEET ENEMA 7-19 GM/118ML RE ENEM: 1.0000 | ENEMA | RECTAL | Status: DC | PRN

## 2011-01-18 MED ORDER — LIDOCAINE HCL (PF) 1 % IJ SOLN
30.0000 mL | INTRAMUSCULAR | Status: DC | PRN
  Filled 2011-01-18: qty 30

## 2011-01-18 MED ORDER — NALBUPHINE SYRINGE 5 MG/0.5 ML
10.0000 mg | INJECTION | INTRAMUSCULAR | Status: DC | PRN
  Administered 2011-01-18: 10 mg via INTRAVENOUS
  Filled 2011-01-18: qty 1

## 2011-01-18 MED ORDER — DEXTROSE 5 % IV SOLN
2.5000 10*6.[IU] | INTRAVENOUS | Status: DC
  Administered 2011-01-18 (×3): 2.5 10*6.[IU] via INTRAVENOUS
  Filled 2011-01-18 (×7): qty 2.5

## 2011-01-18 MED ORDER — CITRIC ACID-SODIUM CITRATE 334-500 MG/5ML PO SOLN: 30.0000 mL | ORAL | Status: DC | PRN

## 2011-01-18 MED ORDER — PENICILLIN G POTASSIUM 5000000 UNITS IJ SOLR
5.0000 10*6.[IU] | Freq: Once | INTRAVENOUS | Status: AC
  Administered 2011-01-18: 5 10*6.[IU] via INTRAVENOUS
  Filled 2011-01-18: qty 5

## 2011-01-18 MED ORDER — IBUPROFEN 600 MG PO TABS: 600.0000 mg | ORAL_TABLET | Freq: Four times a day (QID) | ORAL | Status: DC | PRN

## 2011-01-18 MED ORDER — ONDANSETRON HCL 4 MG/2ML IJ SOLN: 4.0000 mg | Freq: Four times a day (QID) | INTRAMUSCULAR | Status: DC | PRN

## 2011-01-18 MED ORDER — OXYTOCIN 20 UNITS IN LACTATED RINGERS INFUSION - SIMPLE
1.0000 m[IU]/min | INTRAVENOUS | Status: DC
  Administered 2011-01-18: 2 m[IU]/min via INTRAVENOUS

## 2011-01-18 MED ORDER — LACTATED RINGERS IV SOLN
500.0000 mL | INTRAVENOUS | Status: DC | PRN
Start: 2011-01-18 — End: 2011-01-19
  Administered 2011-01-18 – 2011-01-19 (×4): 500 mL via INTRAVENOUS

## 2011-01-18 MED ORDER — OXYTOCIN 20 UNITS IN LACTATED RINGERS INFUSION - SIMPLE
125.0000 mL/h | Freq: Once | INTRAVENOUS | Status: AC
  Administered 2011-01-19: 999 mL/h via INTRAVENOUS

## 2011-01-18 MED ORDER — ACETAMINOPHEN 325 MG PO TABS: 650.0000 mg | ORAL_TABLET | ORAL | Status: DC | PRN

## 2011-01-18 NOTE — H&P (Signed)
Kathy Sandoval is a 25 y.o. female  G2P1001 at [redacted]w[redacted]d presenting for Induction of Labor due to Gestational Diabetes A2 controlled on glyburide.   Patient reports she has sporadically been having contractions over the last few days but is not experiencing any today. Patient denies decreased fetal movement/discharge/blood from vagina/rush of fluid.   History OB History    Grav Para Term Preterm Abortions TAB SAB Ect Mult Living   2 1 1  0 0 0 0 0 0 1     Past Medical History  Diagnosis Date  . Anemia   . Diabetes in pregnancy 11/08/2010   Past Surgical History  Procedure Date  . No past surgeries    Family History: family history includes Thyroid disease in her mother. Social History:  reports that she quit smoking about 15 months ago. Her smoking use included Cigarettes. She has never used smokeless tobacco. She reports that she does not drink alcohol or use illicit drugs.  No current facility-administered medications on file prior to encounter.   Current Outpatient Prescriptions on File Prior to Encounter  Medication Sig Dispense Refill  . glucose blood (ACCU-CHEK COMPACT STRIPS) test strip Use as instructed  100 each  12  . glyBURIDE (DIABETA) 2.5 MG tablet Take 1 tablet (2.5 mg total) by mouth daily with breakfast.  30 tablet  0  . glyBURIDE (DIABETA) 5 MG tablet Take 1 tablet (5 mg total) by mouth at bedtime.  30 tablet  11  . Lancets Misc. (ACCU-CHEK FASTCLIX LANCET) KIT 1 Device by Does not apply route 4 (four) times daily.  1 kit  4  . prenatal vitamin w/FE, FA (PRENATAL 1 + 1) 27-1 MG TABS Take 1 tablet by mouth daily.          No Known Allergies   ROS negative except as noted in HPI     Blood pressure 129/81, pulse 123, temperature 98.1 F (36.7 C), temperature source Oral, resp. rate 20, height 5\' 6"  (1.676 m), weight 117.935 kg (260 lb), last menstrual period 04/05/2010. Exam Physical Exam  Constitutional: She is oriented to person, place, and time. She appears  well-developed. No distress.       obese  HENT:  Mouth/Throat: Oropharynx is clear and moist.  Cardiovascular: Regular rhythm.  Exam reveals no gallop and no friction rub.   No murmur heard.      Slightly tachycardic  Respiratory: Effort normal. She has wheezes. She has no rales. She exhibits no tenderness.  GI:       Gravid. Size consistent with dates. Vertex by leopolds.   Musculoskeletal:       1+ edema bilaterally.   Neurological: She is alert and oriented to person, place, and time.    BP 129/81  Pulse 123  Temp(Src) 98.1 F (36.7 C) (Oral)  Resp 20  Ht 5\' 6"  (1.676 m)  Wt 117.935 kg (260 lb)  BMI 41.97 kg/m2  LMP 04/05/2010 Dilation: 1.5 Effacement (%): 50 Cervical Position: Middle Station: -3 Exam by:: stinson, md  Prenatal labs: ABO, Rh: O/POS/-- (04/18 1219) Antibody: NEG (04/18 1219) Rubella: 13.6 (04/18 1219) RPR: NON REAC (09/10 1131)  HBsAg: NEGATIVE (04/18 1219)  HIV: NON REACTIVE (09/10 1131)  GBS:   Positive Carrier-noted on UA. Treated. Repeat UCx and swab stated negative.   Category I strip-155 baseline. Occasional contractions. Moderate variability. Accels Present. No decels.   Assessment/Plan: #1 Kathy Sandoval 25 y.o. 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  #5 Reassuring FHT  Placed foley bulb. Plan to start pitocin at noon. Will allow to eat until pitocin. Expect NSVD. Doula present for support.  Will treat with penicillin for GBS carrier status.   Supervised by Candelaria Celeste, MD.    Tana Conch, MD, PGY1 01/18/2011, 9:09 AM

## 2011-01-18 NOTE — Progress Notes (Signed)
Subjective: Doing well.  NO complaints.  Objective: BP 119/69  Pulse 89  Temp(Src) 97.9 F (36.6 C) (Oral)  Resp 20  Ht 5\' 6"  (1.676 m)  Wt 117.935 kg (260 lb)  BMI 41.97 kg/m2  LMP 04/05/2010      FHT:  FHR: 150 bpm, variability: moderate,  accelerations:  Present,  decelerations:  Absent UC:   regular, every 3-5 minutes SVE:   Dilation: 5 Effacement (%): 70 Station: -2 Exam by:: dr. Adrian Blackwater  Labs: Lab Results  Component Value Date   WBC 10.9* 01/18/2011   HGB 11.7* 01/18/2011   HCT 35.2* 01/18/2011   MCV 83.2 01/18/2011   PLT 276 01/18/2011    Assessment / Plan: IOL for GDM.  AROM with clear fluid.  Will titrate pitocin.  Category 1 tracing.  anticipate NSVD  Kathy Sandoval 01/18/2011, 4:25 PM

## 2011-01-18 NOTE — Progress Notes (Signed)
Dr. Adrian Blackwater notified of pt having contraction pattern-18 contractions in 30 minutes, FHR reassuring with accelerations and no decelerations. Pitocin at 8 milliunits/hr. Will continue to monitor per MD request

## 2011-01-18 NOTE — Progress Notes (Signed)
Kathy Sandoval is a 25 y.o. G2P1001 at [redacted]w[redacted]d  admitted for induction of labor due to Gestational diabetes.  Subjective: Starting to feel some contractions. Foley bulb has not yet fallen out. Has been up and walking around.   Objective: BP 117/83  Pulse 87  Temp(Src) 98.1 F (36.7 C) (Oral)  Resp 20  Ht 5\' 6"  (1.676 m)  Wt 117.935 kg (260 lb)  BMI 41.97 kg/m2  LMP 04/05/2010      FHT:  FHR: 155 bpm, variability: moderate,  accelerations:  Present,  decelerations:  Absent UC:   occasional SVE:   Dilation: 1.5 Effacement (%): 50 Station: -3 Exam by:: stinson, md  Labs: Lab Results  Component Value Date   WBC 10.9* 01/18/2011   HGB 11.7* 01/18/2011   HCT 35.2* 01/18/2011   MCV 83.2 01/18/2011   PLT 276 01/18/2011    Assessment / Plan: Induction of labor due to gestational diabetes,  progressing well on pitocin  Labor: Progressing normally with foley bulb Fetal Wellbeing:  Category I Pain Control:  Labor support without medications and would like to avoid epidural. Has prn pain meds ordered.  I/D:  PCN to start with Pitocin due to GBS carrier status.  Anticipated MOD:  NSVD  Koraima Albertsen 01/18/2011, 12:13 PM

## 2011-01-18 NOTE — Progress Notes (Signed)
Patient ID: Kathy Sandoval, female   DOB: Dec 20, 1985, 25 y.o.   MRN: [id number]  Bedside US done confirming vertex.  [name] 12:29 PM 01/18/2011

## 2011-01-18 NOTE — Progress Notes (Signed)
Subjective: Moderate contractions.  Objective: BP 136/92  Pulse 115  Temp(Src) 97.8 F (36.6 C) (Oral)  Resp 20  Ht 5\' 6"  (1.676 m)  Wt 117.935 kg (260 lb)  BMI 41.97 kg/m2  LMP 04/05/2010      FHT:  FHR: 150s bpm, variability: moderate,  accelerations:  Present,  decelerations:  Absent UC:   regular, every 3 minutes SVE:   Dilation: 5 Effacement (%): 70 Station: -2 Exam by:: dr. Adrian Blackwater  Labs: Lab Results  Component Value Date   WBC 10.9* 01/18/2011   HGB 11.7* 01/18/2011   HCT 35.2* 01/18/2011   MCV 83.2 01/18/2011   PLT 276 01/18/2011    Assessment / Plan: Little cervical change.  IUPC placed.  Category 1 tracing.  Penicillin for GBS.   Kathy Sandoval 01/18/2011, 6:59 PM

## 2011-01-18 NOTE — Progress Notes (Addendum)
Kathy Sandoval is a 25 y.o. G2P1001 at [redacted]w[redacted]d by admitted for induction of labor due to Gestational diabetes.  Subjective:   Objective: BP 117/79  Pulse 85  Temp(Src) 97.8 F (36.6 C) (Oral)  Resp 20  Ht 5\' 6"  (1.676 m)  Wt 117.935 kg (260 lb)  BMI 41.97 kg/m2  LMP 04/05/2010      FHT:  FHR: 150 bpm, variability: moderate,  accelerations:  Present,  decelerations:  Absent UC:   regular, every 2-3 minutes SVE:   Dilation: 7 Effacement (%): 80 Station: -1 Exam by:: Dr. Remigio Eisenmenger  Labs: Lab Results  Component Value Date   WBC 10.9* 01/18/2011   HGB 11.7* 01/18/2011   HCT 35.2* 01/18/2011   MCV 83.2 01/18/2011   PLT 276 01/18/2011    Assessment / Plan: Induction of labor due to gestational diabetes,  progressing well on pitocin  Labor: Progressing normally and on pitocin Fetal Wellbeing:  Category I Pain Control:  Labor support without medications and will provide nubain prn Anticipated MOD:  NSVD  Drucie Ip 01/18/2011, 9:10 PM

## 2011-01-19 ENCOUNTER — Encounter (HOSPITAL_COMMUNITY): Payer: Self-pay

## 2011-01-19 DIAGNOSIS — O99814 Abnormal glucose complicating childbirth: Secondary | ICD-10-CM

## 2011-01-19 DIAGNOSIS — O9989 Other specified diseases and conditions complicating pregnancy, childbirth and the puerperium: Secondary | ICD-10-CM

## 2011-01-19 MED ORDER — WITCH HAZEL-GLYCERIN EX PADS: 1.0000 "application " | MEDICATED_PAD | CUTANEOUS | Status: DC | PRN

## 2011-01-19 MED ORDER — OXYCODONE-ACETAMINOPHEN 5-325 MG PO TABS
1.0000 | ORAL_TABLET | ORAL | Status: DC | PRN
  Administered 2011-01-19: 1 via ORAL
  Filled 2011-01-19: qty 1

## 2011-01-19 MED ORDER — LANOLIN HYDROUS EX OINT: TOPICAL_OINTMENT | CUTANEOUS | Status: DC | PRN

## 2011-01-19 MED ORDER — SENNOSIDES-DOCUSATE SODIUM 8.6-50 MG PO TABS
2.0000 | ORAL_TABLET | Freq: Every day | ORAL | Status: DC
  Administered 2011-01-19 – 2011-01-20 (×2): 2 via ORAL

## 2011-01-19 MED ORDER — SIMETHICONE 80 MG PO CHEW: 80.0000 mg | CHEWABLE_TABLET | ORAL | Status: DC | PRN

## 2011-01-19 MED ORDER — IBUPROFEN 600 MG PO TABS
600.0000 mg | ORAL_TABLET | Freq: Four times a day (QID) | ORAL | Status: DC
  Administered 2011-01-19 – 2011-01-21 (×10): 600 mg via ORAL
  Filled 2011-01-19 (×10): qty 1

## 2011-01-19 MED ORDER — TETANUS-DIPHTH-ACELL PERTUSSIS 5-2.5-18.5 LF-MCG/0.5 IM SUSP: 0.5000 mL | Freq: Once | INTRAMUSCULAR | Status: DC

## 2011-01-19 MED ORDER — ONDANSETRON HCL 4 MG PO TABS: 4.0000 mg | ORAL_TABLET | ORAL | Status: DC | PRN

## 2011-01-19 MED ORDER — ONDANSETRON HCL 4 MG/2ML IJ SOLN: 4.0000 mg | INTRAMUSCULAR | Status: DC | PRN

## 2011-01-19 MED ORDER — BENZOCAINE-MENTHOL 20-0.5 % EX AERO
1.0000 "application " | INHALATION_SPRAY | CUTANEOUS | Status: DC | PRN
  Administered 2011-01-19 – 2011-01-21 (×2): 1 via TOPICAL

## 2011-01-19 MED ORDER — ZOLPIDEM TARTRATE 5 MG PO TABS: 5.0000 mg | ORAL_TABLET | Freq: Every evening | ORAL | Status: DC | PRN

## 2011-01-19 MED ORDER — PRENATAL PLUS 27-1 MG PO TABS
1.0000 | ORAL_TABLET | Freq: Every day | ORAL | Status: DC
  Administered 2011-01-19 – 2011-01-21 (×3): 1 via ORAL
  Filled 2011-01-19 (×3): qty 1

## 2011-01-19 MED ORDER — BENZOCAINE-MENTHOL 20-0.5 % EX AERO
INHALATION_SPRAY | CUTANEOUS | Status: AC
  Administered 2011-01-19: 1 via TOPICAL
  Filled 2011-01-19: qty 56

## 2011-01-19 MED ORDER — DIBUCAINE 1 % RE OINT: 1.0000 "application " | TOPICAL_OINTMENT | RECTAL | Status: DC | PRN

## 2011-01-19 MED ORDER — DIPHENHYDRAMINE HCL 25 MG PO CAPS
25.0000 mg | ORAL_CAPSULE | Freq: Four times a day (QID) | ORAL | Status: DC | PRN
Start: 2011-01-19 — End: 2011-01-21

## 2011-01-19 NOTE — Progress Notes (Signed)
UR chart review completed.  

## 2011-01-20 NOTE — Progress Notes (Signed)
Post Partum Day 1 Subjective: no complaints, up ad lib, voiding, tolerating PO and + flatus  Objective: Blood pressure 122/83, pulse 81, temperature 97.7 F (36.5 C), temperature source Oral, resp. rate 20, height 5\' 6"  (1.676 m), weight 117.935 kg (260 lb), last menstrual period 04/05/2010, SpO2 97.00%, unknown if currently breastfeeding.  Physical Exam:  General: alert, cooperative and no distress Lochia: appropriate Uterine Fundus: firm DVT Evaluation: No evidence of DVT seen on physical exam. No cords or calf tenderness.   Basename 01/18/11 1007  HGB 11.7*  HCT 35.2*    Assessment/Plan: Plan for discharge tomorrow, Breastfeeding and Contraception nexplanon. Fasting plasma glucose pending later this morning.   LOS: 2 days   Drucie Ip 01/20/2011, 7:19 AM

## 2011-01-21 MED ORDER — BENZOCAINE-MENTHOL 20-0.5 % EX AERO
INHALATION_SPRAY | CUTANEOUS | Status: AC
  Filled 2011-01-21: qty 56

## 2011-01-21 MED ORDER — IBUPROFEN 600 MG PO TABS: 600.0000 mg | ORAL_TABLET | Freq: Four times a day (QID) | ORAL | Status: AC

## 2011-01-21 NOTE — Progress Notes (Signed)
Post Partum Day 2  Subjective: no complaints, up ad lib, voiding, tolerating PO and + flatus  Objective: Blood pressure 102/70, pulse 88, temperature 97.6 F (36.4 C), temperature source Oral, resp. rate 20, height 5\' 6"  (1.676 m), weight 117.935 kg (260 lb), last menstrual period 04/05/2010, SpO2 97.00%, unknown if currently breastfeeding.  Physical Exam:  General: alert, cooperative and no distress Lochia: appropriate Uterine Fundus: firm DVT Evaluation: No evidence of DVT seen on physical exam. No cords or calf tenderness.   Basename 01/18/11 1007  HGB 11.7*  HCT 35.2*    Assessment/Plan: Discharge home, Breastfeeding and Contraception nexplanon   LOS: 3 days   Kathy Sandoval 01/21/2011, 7:16 AM

## 2011-01-21 NOTE — Discharge Summary (Signed)
Obstetric Discharge Summary Reason for Admission: induction of labor Prenatal Procedures: none Intrapartum Procedures: spontaneous vaginal delivery Postpartum Procedures: none Complications-Operative and Postpartum: none Hemoglobin  Date Value Range Status  01/18/2011 11.7* 12.0-15.0 (g/dL) Final     HCT  Date Value Range Status  01/18/2011 35.2* 36.0-46.0 (%) Final    Discharge Diagnoses: Term Pregnancy-delivered  Discharge Information: Date: 01/21/2011 Activity: pelvic rest and for 6 weeks Diet: routine Medications: Ibuprofen Condition: stable Instructions: refer to practice specific booklet Discharge to: home Follow-up Information    Please follow up. (Follow-up with Dr. Katrinka Blazing in 5-6 weeks)          Newborn Data: Live born female  Birth Weight: 8 lb 7.6 oz (3845 g) APGAR: 8, 9  Home with mother.  Drucie Ip 01/21/2011, 7:19 AM

## 2011-01-26 NOTE — Progress Notes (Signed)
I have reviewed the reactive NST of 11/15 

## 2011-02-07 NOTE — Telephone Encounter (Signed)
Preadmission screen  

## 2011-02-25 ENCOUNTER — Ambulatory Visit: Payer: Medicaid Other | Admitting: Obstetrics and Gynecology

## 2011-03-15 ENCOUNTER — Ambulatory Visit (INDEPENDENT_AMBULATORY_CARE_PROVIDER_SITE_OTHER): Payer: Medicaid Other | Admitting: Family Medicine

## 2011-03-15 ENCOUNTER — Encounter: Payer: Self-pay | Admitting: Family Medicine

## 2011-03-15 DIAGNOSIS — Z309 Encounter for contraceptive management, unspecified: Secondary | ICD-10-CM

## 2011-03-15 DIAGNOSIS — Z3046 Encounter for surveillance of implantable subdermal contraceptive: Secondary | ICD-10-CM

## 2011-03-15 DIAGNOSIS — Z30017 Encounter for initial prescription of implantable subdermal contraceptive: Secondary | ICD-10-CM

## 2011-03-15 MED ORDER — ETONOGESTREL 68 MG ~~LOC~~ IMPL
68.0000 mg | DRUG_IMPLANT | Freq: Once | SUBCUTANEOUS | Status: AC
  Administered 2011-03-15: 68 mg via SUBCUTANEOUS

## 2011-03-15 MED ORDER — FLUTICASONE PROPIONATE 50 MCG/ACT NA SUSP: 2.0000 | Freq: Every day | NASAL | Status: DC

## 2011-03-15 NOTE — Patient Instructions (Signed)
Patient given handout on Implanon care afterwards.

## 2011-03-15 NOTE — Assessment & Plan Note (Signed)
Patient is 6 weeks out from vaginal delivery doing very well no concerns. No signs of depression Implanon placed today followup as needed. Encourage patient to continue breast-feeding

## 2011-03-15 NOTE — Progress Notes (Signed)
  Subjective:    Patient ID: Kathy Sandoval, female    DOB: 31-May-1985, 26 y.o.   MRN: [id number]  HPI 26 year old female who is here for her postpartum 6 week check. Patient is doing very well had a spontaneous vaginal delivery of a very healthy girl. Patient's is breast-feeding doing very well but no production patient's daughter has good latch no concerns at this time. Patient has not had a period yet and does not want to. Patient has had Implanon before and would like that today as well. Patient declines any depressive symptoms as well as declines any suicidal or homicidal ideation. Patient has lost a considerable amount of weight and is very proud of herself.   Review of Systems As stated above    Objective:   Physical Exam General: No apparent distress alert and oriented x3 Cardiovascular: Regular rate and rhythm no murmurs Pulmonary: Clear to auscultation bilaterally Abdomen: Soft sounds positive in all 4 quadrants nontender no masses no organomegaly Extremities: 2+ pulses no edema neurovascularly intact.  Procedure note After verbal consent as well as written consent patient did have Implanon placed. This was done after patient was dressed with Betadine marked appropriately sterile technique used. Patient was injected with 4 cc of 1% lidocaine with a 25-gauge 1-1/2 needle. Patient then did have Implanon inserted without any difficulties able to palpate both myself as well as the patient. In the left upper arm. Patient given card and expiration date.       Assessment & Plan:

## 2012-03-26 IMAGING — US US OB FOLLOW-UP
1 series · 12 of 28 positions shown · non-contrast
Comparison: none

[Series 1: us ob follow up · 12 of 31 slices shown]
[im 2/31]
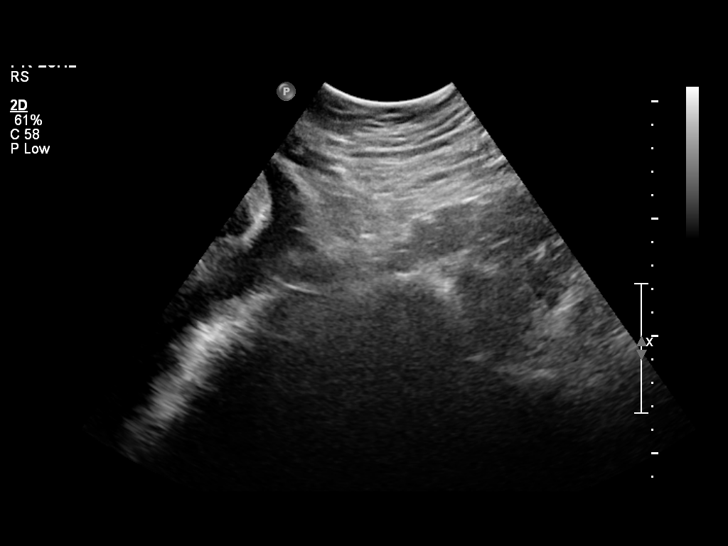
[im 4/31]
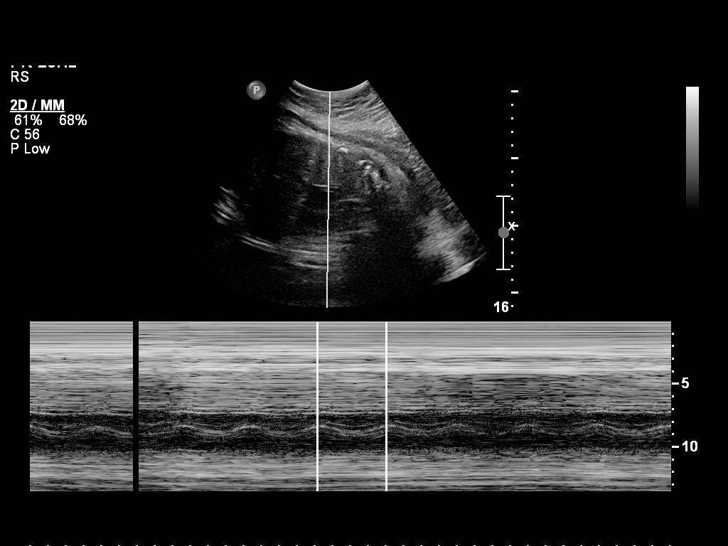
[im 6/31]
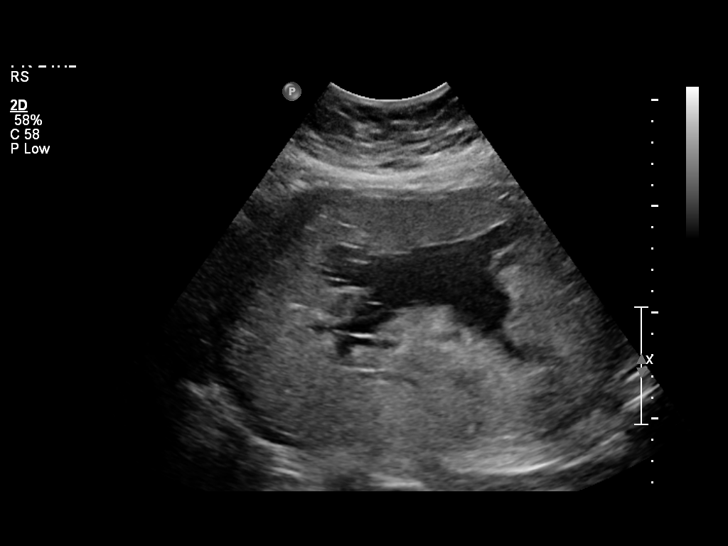
[im 9/31]
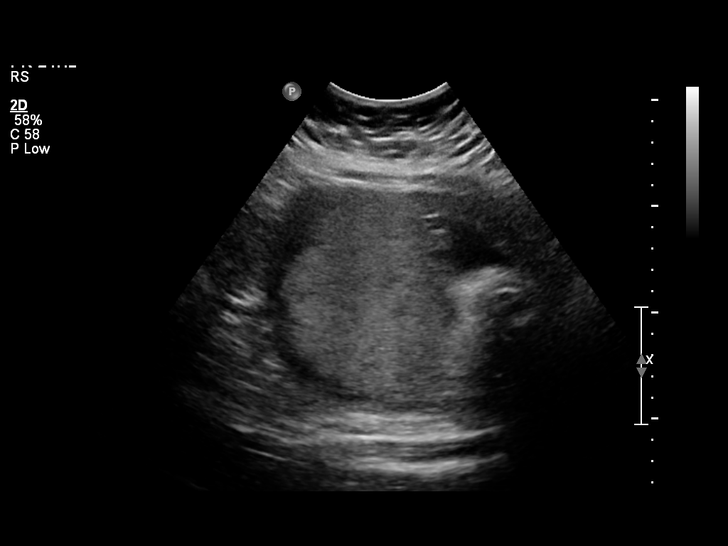
[im 12/31]
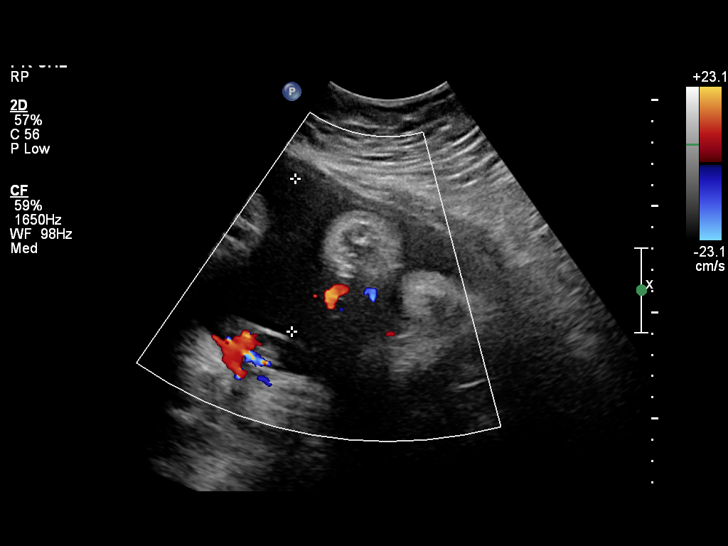
[im 14/31]
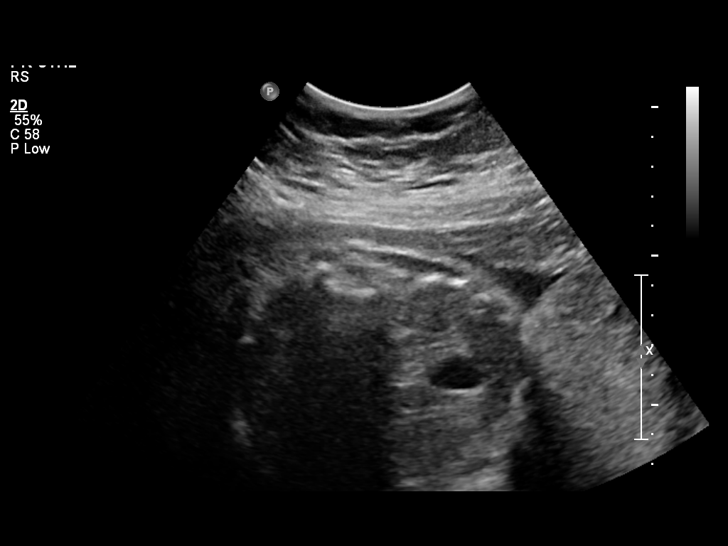
[im 17/31]
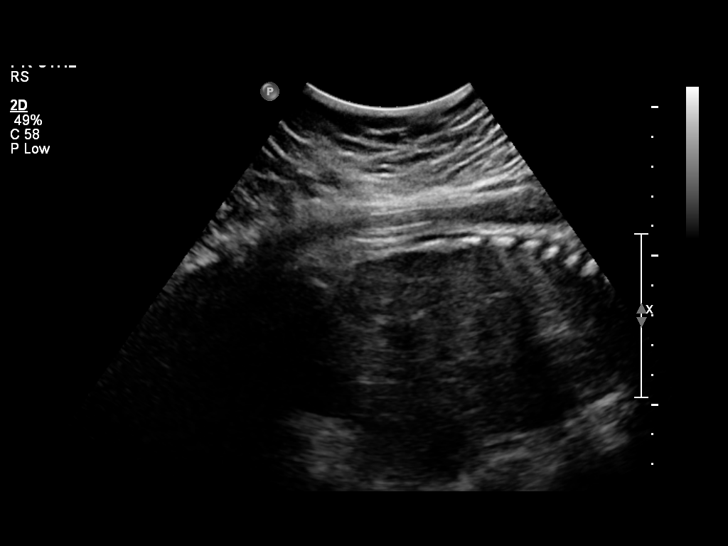
[im 19/31]
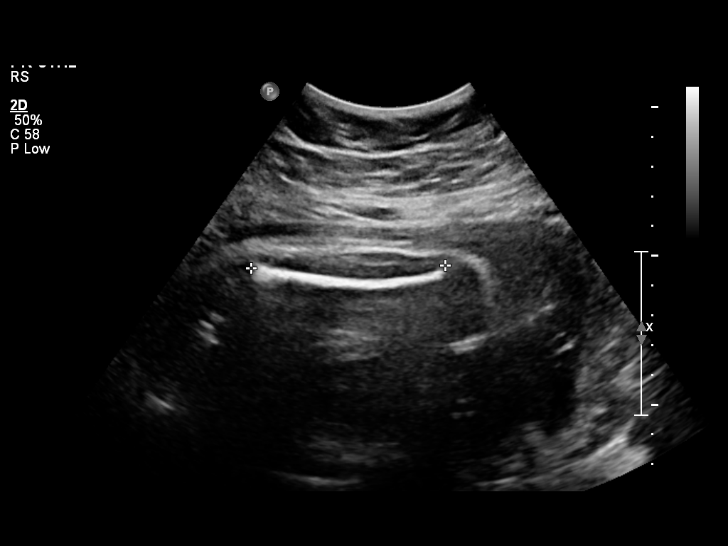
[im 22/31]
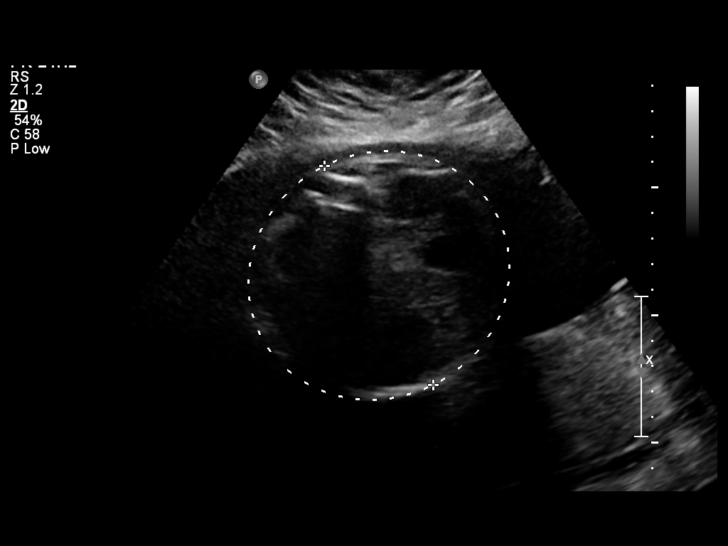
[im 25/31]
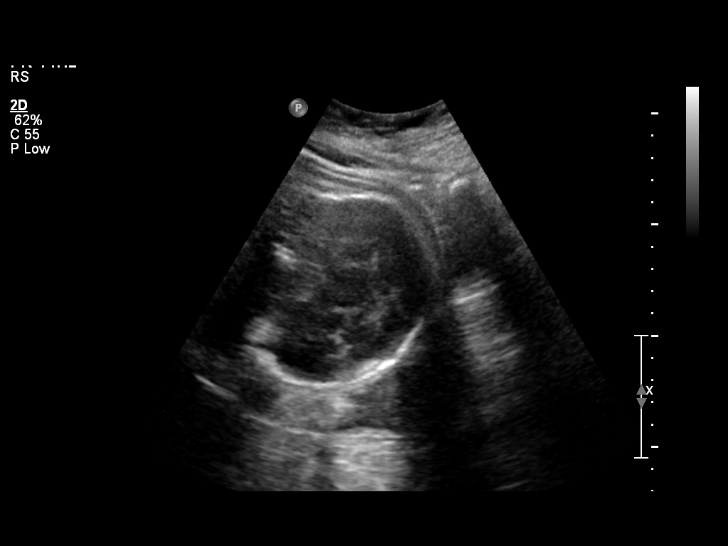
[im 27/31]
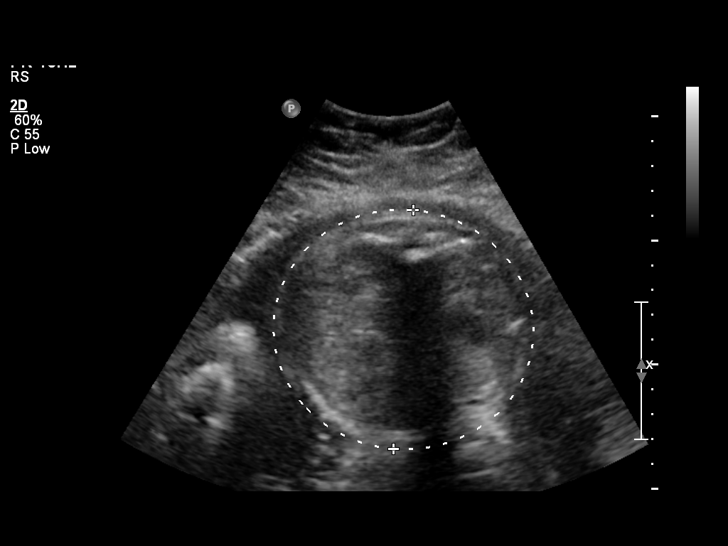
[im 29/31]
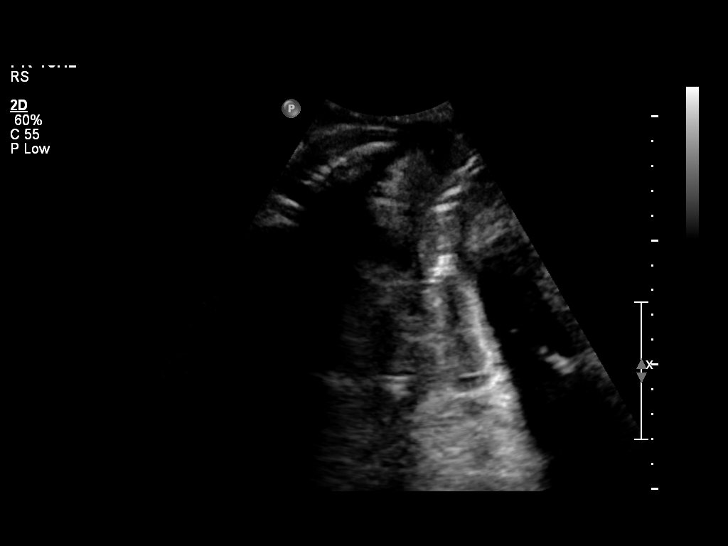

[12 of 28 positions shown; findings below may reference images not displayed]

OBSTETRICS REPORT
                      (Signed Final 12/02/2010 [DATE])

                 DO
 Order#:         [id number]
Procedures

 US OB FOLLOW UP                                       76816.1
Indications

 Obesity (262 lbs)
 Assess Fetal Growth / Estimated Fetal Weight
 Diabetes - Gestational, A2
Fetal Evaluation

 Fetal Heart Rate:  150                          bpm
 Cardiac Activity:  Observed
 Presentation:      Cephalic
 Placenta:          Fundal, above cervical os
 P. Cord            Previously Visualized
 Insertion:

 Amniotic Fluid
 AFI FV:      Subjectively within normal limits
 AFI Sum:     19.19   cm       72  %Tile     Larg Pckt:    7.16  cm
 RUQ:   4.7     cm   RLQ:    3.33   cm    LUQ:   4       cm   LLQ:    7.16   cm
Biometry

 BPD:     81.4  mm     G. Age:  32w 5d                CI:        77.55   70 - 86
                                                      FL/HC:      22.0   19.1 -

 HC:     292.6  mm     G. Age:  32w 2d       16  %    HC/AC:      0.93   0.96 -

 AC:     314.1  mm     G. Age:  35w 2d     > 97  %    FL/BPD:     79.1   71 - 87
 FL:      64.4  mm     G. Age:  33w 2d       64  %    FL/AC:      20.5   20 - 24

 Est. FW:    9278  gm      5 lb 4 oz     83  %
Gestational Age

 U/S Today:     33w 3d                                        EDD:   01/17/11
 Best:          32w 2d     Det. By:  U/S C R L   (06/16/10)   EDD:   01/25/11
Anatomy
 Cranium:           Previously seen     Aortic Arch:       Previously seen
 Fetal Cavum:       Previously seen     Ductal Arch:       Previously seen
 Ventricles:        Not well            Diaphragm:         Previously seen
                    visualized
                    today. Seen
                    previously
 Choroid Plexus:    Previously seen     Stomach:           Appears
                                                           normal, left
                                                           sided
 Cerebellum:        Previously seen     Abdomen:           Appears normal
 Posterior Fossa:   Previously seen     Abdominal Wall:    Previously seen
 Nuchal Fold:       Not applicable      Cord Vessels:      Previously seen
                    (>20 wks GA)
 Face:              Previously seen     Kidneys:           Appear normal
 Heart:             Not well            Bladder:           Appears normal
                    visualized
                    today. Seen
                    previously.
 RVOT:              Previously seen     Spine:             Previously seen
 LVOT:              Previously seen     Limbs:             Four extremities
                                                           previously seen

 Other:     Fetus appears to be a female. 5th digit previously
            visualized. Technically difficult due to maternal habitus
            and fetal position.
Cervix Uterus Adnexa

 Cervical Length:    3.4      cm

 Cervix:       Closed. Measured translabially.

 Adnexa:     No abnormality visualized.
Impression

 Single intrauterine gestation demonstrating an estimated
 gestational age by ultrasound of 33w 3d. This is correlated
 with expected estimated gestational age by of 32w 2d. The
 AC is larger than the remaining gestational indicators but
 EFW is stable at the 83%. Close follow-up for growth can be
 performed in 3 weeks to exclude developing fetal
 macrosomia.

 No late developing fetal anatomic abnormalities are noted
 associated with the stomach, kidneys or bladder. The four
 chamber heart and lateral ventricles could not be well
 assessed due to fetal positioning combined with advanced
 gestational age and maternal body habitus.

 Subjectively and quantitatively normal amniotic fluid volume.

 Normal cervical length and appearance, measured
 translabially.

## 2013-12-30 ENCOUNTER — Encounter: Payer: Self-pay | Admitting: Family Medicine

## 2014-08-04 ENCOUNTER — Ambulatory Visit: Payer: Medicaid Other | Admitting: Family Medicine

## 2014-09-04 ENCOUNTER — Encounter (HOSPITAL_COMMUNITY): Payer: Self-pay | Admitting: *Deleted

## 2014-09-04 ENCOUNTER — Emergency Department (HOSPITAL_COMMUNITY)
Admission: EM | Admit: 2014-09-04 | Discharge: 2014-09-04 | Disposition: A | Discharge status: Home / Self Care | Payer: Medicaid Other | Attending: Emergency Medicine | Admitting: Emergency Medicine

## 2014-09-04 DIAGNOSIS — Z862 Personal history of diseases of the blood and blood-forming organs and certain disorders involving the immune mechanism: Secondary | ICD-10-CM | POA: Insufficient documentation

## 2014-09-04 DIAGNOSIS — Z79899 Other long term (current) drug therapy: Secondary | ICD-10-CM | POA: Insufficient documentation

## 2014-09-04 DIAGNOSIS — N12 Tubulo-interstitial nephritis, not specified as acute or chronic: Secondary | ICD-10-CM | POA: Insufficient documentation

## 2014-09-04 DIAGNOSIS — E118 Type 2 diabetes mellitus with unspecified complications: Secondary | ICD-10-CM

## 2014-09-04 DIAGNOSIS — Z7951 Long term (current) use of inhaled steroids: Secondary | ICD-10-CM | POA: Insufficient documentation

## 2014-09-04 DIAGNOSIS — Z8632 Personal history of gestational diabetes: Secondary | ICD-10-CM | POA: Insufficient documentation

## 2014-09-04 DIAGNOSIS — Z87891 Personal history of nicotine dependence: Secondary | ICD-10-CM | POA: Insufficient documentation

## 2014-09-04 DIAGNOSIS — R000 Tachycardia, unspecified: Secondary | ICD-10-CM | POA: Insufficient documentation

## 2014-09-04 DIAGNOSIS — Z7982 Long term (current) use of aspirin: Secondary | ICD-10-CM | POA: Insufficient documentation

## 2014-09-04 DIAGNOSIS — Z3202 Encounter for pregnancy test, result negative: Secondary | ICD-10-CM | POA: Insufficient documentation

## 2014-09-04 LAB — COMPREHENSIVE METABOLIC PANEL
ALBUMIN: 3.5 g/dL (ref 3.5–5.0)
ALK PHOS: 85 U/L (ref 38–126)
ALT: 18 U/L (ref 14–54)
AST: 18 U/L (ref 15–41)
Anion gap: 12 (ref 5–15)
BILIRUBIN TOTAL: 1.3 mg/dL — AB (ref 0.3–1.2)
BUN: 8 mg/dL (ref 6–20)
CO2: 21 mmol/L — ABNORMAL LOW (ref 22–32)
CREATININE: 0.98 mg/dL (ref 0.44–1.00)
Calcium: 9.1 mg/dL (ref 8.9–10.3)
Chloride: 99 mmol/L — ABNORMAL LOW (ref 101–111)
GFR calc Af Amer: >60 mL/min (ref >60)
GFR calc non Af Amer: >60 mL/min (ref >60)
Glucose, Bld: 410 mg/dL — ABNORMAL HIGH (ref 65–99)
Potassium: 4.2 mmol/L (ref 3.5–5.1)
Sodium: 132 mmol/L — ABNORMAL LOW (ref 135–145)
TOTAL PROTEIN: 7.8 g/dL (ref 6.5–8.1)

## 2014-09-04 LAB — POC URINE PREG, ED: PREG TEST UR: NEGATIVE

## 2014-09-04 LAB — URINALYSIS, ROUTINE W REFLEX MICROSCOPIC
Bilirubin Urine: NEGATIVE
Ketones, ur: 40 mg/dL — AB
Nitrite: NEGATIVE
PROTEIN: 30 mg/dL — AB
SPECIFIC GRAVITY, URINE: 1.028 (ref 1.005–1.030)
UROBILINOGEN UA: 1 mg/dL (ref 0.0–1.0)
pH: 6 (ref 5.0–8.0)

## 2014-09-04 LAB — CBC WITH DIFFERENTIAL/PLATELET
BASOS ABS: 0 10*3/uL (ref 0.0–0.1)
BASOS PCT: 0 % (ref 0–1)
EOS PCT: 0 % (ref 0–5)
Eosinophils Absolute: 0 10*3/uL (ref 0.0–0.7)
HCT: 42.9 % (ref 36.0–46.0)
HEMOGLOBIN: 14.3 g/dL (ref 12.0–15.0)
LYMPHS ABS: 1.7 10*3/uL (ref 0.7–4.0)
Lymphocytes Relative: 10 % — ABNORMAL LOW (ref 12–46)
MCH: 29.1 pg (ref 26.0–34.0)
MCHC: 33.3 g/dL (ref 30.0–36.0)
MCV: 87.4 fL (ref 78.0–100.0)
MONO ABS: 1.4 10*3/uL — AB (ref 0.1–1.0)
MONOS PCT: 8 % (ref 3–12)
Neutro Abs: 14.1 10*3/uL — ABNORMAL HIGH (ref 1.7–7.7)
Neutrophils Relative %: 82 % — ABNORMAL HIGH (ref 43–77)
Platelets: 249 10*3/uL (ref 150–400)
RBC: 4.91 MIL/uL (ref 3.87–5.11)
RDW: 13.5 % (ref 11.5–15.5)
WBC: 17.2 10*3/uL — ABNORMAL HIGH (ref 4.0–10.5)

## 2014-09-04 LAB — URINE MICROSCOPIC-ADD ON

## 2014-09-04 LAB — CBG MONITORING, ED: GLUCOSE-CAPILLARY: 290 mg/dL — AB (ref 65–99)

## 2014-09-04 LAB — I-STAT CG4 LACTIC ACID, ED: Lactic Acid, Venous: 1.78 mmol/L (ref 0.5–2.0)

## 2014-09-04 LAB — LIPASE, BLOOD: LIPASE: 12 U/L — AB (ref 22–51)

## 2014-09-04 MED ORDER — FENTANYL CITRATE (PF) 100 MCG/2ML IJ SOLN
INTRAMUSCULAR | Status: AC
  Filled 2014-09-04: qty 2

## 2014-09-04 MED ORDER — HYDROCODONE-ACETAMINOPHEN 5-325 MG PO TABS: 1.0000 | ORAL_TABLET | ORAL | Status: DC | PRN

## 2014-09-04 MED ORDER — FENTANYL CITRATE (PF) 100 MCG/2ML IJ SOLN
50.0000 ug | Freq: Once | INTRAMUSCULAR | Status: AC
  Administered 2014-09-04: 50 ug via NASAL

## 2014-09-04 MED ORDER — KETOROLAC TROMETHAMINE 30 MG/ML IJ SOLN
30.0000 mg | Freq: Once | INTRAMUSCULAR | Status: AC
  Administered 2014-09-04: 30 mg via INTRAVENOUS
  Filled 2014-09-04: qty 1

## 2014-09-04 MED ORDER — ONDANSETRON 8 MG PO TBDP: 8.0000 mg | ORAL_TABLET | Freq: Three times a day (TID) | ORAL | Status: DC | PRN

## 2014-09-04 MED ORDER — DEXTROSE 5 % IV SOLN
1.0000 g | Freq: Once | INTRAVENOUS | Status: AC
  Administered 2014-09-04: 1 g via INTRAVENOUS
  Filled 2014-09-04: qty 10

## 2014-09-04 MED ORDER — METFORMIN HCL 500 MG PO TABS: 500.0000 mg | ORAL_TABLET | Freq: Two times a day (BID) | ORAL | Status: DC

## 2014-09-04 MED ORDER — SODIUM CHLORIDE 0.9 % IV BOLUS (SEPSIS)
1000.0000 mL | Freq: Once | INTRAVENOUS | Status: AC
  Administered 2014-09-04: 1000 mL via INTRAVENOUS

## 2014-09-04 MED ORDER — CEPHALEXIN 500 MG PO CAPS: 500.0000 mg | ORAL_CAPSULE | Freq: Four times a day (QID) | ORAL | Status: DC

## 2014-09-04 MED ORDER — ONDANSETRON 4 MG PO TBDP
ORAL_TABLET | ORAL | Status: AC
  Filled 2014-09-04: qty 2

## 2014-09-04 MED ORDER — ONDANSETRON 4 MG PO TBDP
8.0000 mg | ORAL_TABLET | Freq: Once | ORAL | Status: AC
  Administered 2014-09-04: 8 mg via ORAL

## 2014-09-04 NOTE — ED Provider Notes (Signed)
CSN: [id number]     Arrival date & time 09/04/14  1215 History   First MD Initiated Contact with Patient 09/04/14 1431     Chief Complaint  Patient presents with  . Headache  . Abdominal Pain    HPI The patient presents to the emergency room with complaints of right lower abdominal pain, nausea, headaches and diaphoresis for the last 3 days. Patient denies any vomiting or diarrhea. She denies any dysuria or hematuria. She has felt feverish. TMs worsened over the last few days so she came to the emergency department today. Past Medical History  Diagnosis Date  . Anemia   . Diabetes in pregnancy 11/08/2010   Past Surgical History  Procedure Laterality Date  . No past surgeries     Family History  Problem Relation Age of Onset  . Thyroid disease Mother    History  Substance Use Topics  . Smoking status: Former Smoker    Types: Cigarettes    Quit date: 10/10/2009  . Smokeless tobacco: Never Used  . Alcohol Use: No   OB History    Gravida Para Term Preterm AB TAB SAB Ectopic Multiple Living   2 2 2  0 0 0 0 0 0 2     Review of Systems  All other systems reviewed and are negative.     Allergies  Review of patient's allergies indicates no known allergies.  Home Medications   Prior to Admission medications   Medication Sig Start Date End Date Taking? Authorizing Provider  Aspirin-Caffeine (BC FAST PAIN RELIEF PO) Take 1 packet by mouth 2 (two) times daily as needed (pain).   Yes Historical Provider, MD  fluticasone (FLONASE) 50 MCG/ACT nasal spray Place 2 sprays into the nose daily. 03/15/11 03/14/12  Judi SaaZachary M Smith, DO  prenatal vitamin w/FE, FA (PRENATAL 1 + 1) 27-1 MG TABS Take 1 tablet by mouth daily.      Historical Provider, MD   BP 106/48 mmHg  Pulse 115  Temp(Src) 98.7 F (37.1 C) (Oral)  Resp 18  SpO2 99% Physical Exam  Constitutional: She appears well-developed and well-nourished. No distress.  HENT:  Head: Normocephalic and atraumatic.  Right Ear:  External ear normal.  Left Ear: External ear normal.  Eyes: Conjunctivae are normal. Right eye exhibits no discharge. Left eye exhibits no discharge. No scleral icterus.  Neck: Neck supple. No tracheal deviation present.  Cardiovascular: Regular rhythm and intact distal pulses.  Tachycardia present.   Pulmonary/Chest: Effort normal and breath sounds normal. No stridor. No respiratory distress. She has no wheezes. She has no rales.  Abdominal: Soft. Bowel sounds are normal. She exhibits no distension. There is tenderness in the right lower quadrant. There is no rigidity, no rebound and no guarding. No hernia.  Musculoskeletal: She exhibits no edema or tenderness.  Neurological: She is alert. She has normal strength. No cranial nerve deficit (no facial droop, extraocular movements intact, no slurred speech) or sensory deficit. She exhibits normal muscle tone. She displays no seizure activity. Coordination normal.  Skin: Skin is warm and dry. No rash noted.  Psychiatric: She has a normal mood and affect.  Nursing note and vitals reviewed.   ED Course  Procedures (including critical care time) Labs Review Labs Reviewed  CBC WITH DIFFERENTIAL/PLATELET - Abnormal; Notable for the following:    WBC 17.2 (*)    Neutrophils Relative % 82 (*)    Neutro Abs 14.1 (*)    Lymphocytes Relative 10 (*)    Monocytes Absolute  1.4 (*)    All other components within normal limits  COMPREHENSIVE METABOLIC PANEL - Abnormal; Notable for the following:    Sodium 132 (*)    Chloride 99 (*)    CO2 21 (*)    Glucose, Bld 410 (*)    Total Bilirubin 1.3 (*)    All other components within normal limits  LIPASE, BLOOD - Abnormal; Notable for the following:    Lipase 12 (*)    All other components within normal limits  URINALYSIS, ROUTINE W REFLEX MICROSCOPIC (NOT AT [facility]) - Abnormal; Notable for the following:    Glucose, UA >1000 (*)    Hgb urine dipstick MODERATE (*)    Ketones, ur 40 (*)    Protein, ur 30  (*)    Leukocytes, UA MODERATE (*)    All other components within normal limits  URINE MICROSCOPIC-ADD ON - Abnormal; Notable for the following:    Squamous Epithelial / LPF FEW (*)    Bacteria, UA FEW (*)    All other components within normal limits  POC URINE PREG, ED  I-STAT CG4 LACTIC ACID, ED  CBG MONITORING, ED    Medications  ondansetron (ZOFRAN-ODT) disintegrating tablet 8 mg (8 mg Oral Given 09/04/14 1248)  fentaNYL (SUBLIMAZE) injection 50 mcg (50 mcg Nasal Given 09/04/14 1248)  ketorolac (TORADOL) 30 MG/ML injection 30 mg (30 mg Intravenous Given 09/04/14 1509)  cefTRIAXone (ROCEPHIN) 1 g in dextrose 5 % 50 mL IVPB (1 g Intravenous New Bag/Given 09/04/14 1510)  sodium chloride 0.9 % bolus 1,000 mL (1,000 mLs Intravenous New Bag/Given 09/04/14 1510)     MDM   Final diagnoses:  Pyelonephritis  Type 2 diabetes mellitus with complication    Pt's labs are suggestive of a UTI.  Sx suggestive of pyelonephritis.  Doubt appendicitis with the large WBC in the urine and bacteria.  Pt remains tachcyardic.  Will give fluids, abx and reassess.  Pt also with new onset diabetes.  Previously had gestational DM.  Will give fluids and recheck.  Blood sugar has decreased.  Will dc home on metformin.  Low carb diet.  Rx keflex for pyelo, uti.  Follow up with her PCP in the next 1-2 days to be rechecked.    Linwood Dibbles, MD 09/04/14 [phone]

## 2014-09-04 NOTE — ED Notes (Signed)
MD at bedside. 

## 2014-09-04 NOTE — ED Notes (Signed)
CBG 290 

## 2014-09-04 NOTE — Discharge Instructions (Signed)
Basic Carbohydrate Counting for Diabetes Mellitus Carbohydrate counting is a method for keeping track of the amount of carbohydrates you eat. Eating carbohydrates naturally increases the level of sugar (glucose) in your blood, so it is important for you to know the amount that is okay for you to have in every meal. Carbohydrate counting helps keep the level of glucose in your blood within normal limits. The amount of carbohydrates allowed is different for every person. A dietitian can help you calculate the amount that is right for you. Once you know the amount of carbohydrates you can have, you can count the carbohydrates in the foods you want to eat. Carbohydrates are found in the following foods:  Grains, such as breads and cereals.  Dried beans and soy products.  Starchy vegetables, such as potatoes, peas, and corn.  Fruit and fruit juices.  Milk and yogurt.  Sweets and snack foods, such as cake, cookies, candy, chips, soft drinks, and fruit drinks. CARBOHYDRATE COUNTING There are two ways to count the carbohydrates in your food. You can use either of the methods or a combination of both. Reading the "Nutrition Facts" on Packaged Food The "Nutrition Facts" is an area that is included on the labels of almost all packaged food and beverages in the [place]. It includes the serving size of that food or beverage and information about the nutrients in each serving of the food, including the grams (g) of carbohydrate per serving.  Decide the number of servings of this food or beverage that you will be able to eat or drink. Multiply that number of servings by the number of grams of carbohydrate that is listed on the label for that serving. The total will be the amount of carbohydrates you will be having when you eat or drink this food or beverage. Learning Standard Serving Sizes of Food When you eat food that is not packaged or does not include "Nutrition Facts" on the label, you need to  measure the servings in order to count the amount of carbohydrates.A serving of most carbohydrate-rich foods contains about 15 g of carbohydrates. The following list includes serving sizes of carbohydrate-rich foods that provide 15 g ofcarbohydrate per serving:   1 slice of bread (1 oz) or 1 six-inch tortilla.    of a hamburger bun or English muffin.  4-6 crackers.   cup unsweetened dry cereal.    cup hot cereal.   cup rice or pasta.    cup mashed potatoes or  of a large baked potato.  1 cup fresh fruit or one small piece of fruit.    cup canned or frozen fruit or fruit juice.  1 cup milk.   cup plain fat-free yogurt or yogurt sweetened with artificial sweeteners.   cup cooked dried beans or starchy vegetable, such as peas, corn, or potatoes.  Decide the number of standard-size servings that you will eat. Multiply that number of servings by 15 (the grams of carbohydrates in that serving). For example, if you eat 2 cups of strawberries, you will have eaten 2 servings and 30 g of carbohydrates (2 servings x 15 g = 30 g). For foods such as soups and casseroles, in which more than one food is mixed in, you will need to count the carbohydrates in each food that is included. EXAMPLE OF CARBOHYDRATE COUNTING Sample Dinner  3 oz chicken breast.   cup of brown rice.   cup of corn.  1 cup milk.   1 cup strawberries with  sugar-free whipped topping.  Carbohydrate Calculation Step 1: Identify the foods that contain carbohydrates:   Rice.   Corn.   Milk.   Strawberries. Step 2:Calculate the number of servings eaten of each:   2 servings of rice.   1 serving of corn.   1 serving of milk.   1 serving of strawberries. Step 3: Multiply each of those number of servings by 15 g:   2 servings of rice x 15 g = 30 g.   1 serving of corn x 15 g = 15 g.   1 serving of milk x 15 g = 15 g.   1 serving of strawberries x 15 g = 15 g. Step 4: Add  together all of the amounts to find the total grams of carbohydrates eaten: 30 g + 15 g + 15 g + 15 g = 75 g. Document Released: 02/14/2005 Document Revised: 07/01/2013 Document Reviewed: 01/11/2013 [facility] Patient Information 2015 [place]. This information is not intended to replace advice given to you by your health care provider. Make sure you discuss any questions you have with your health care provider. Pyelonephritis, Adult Pyelonephritis is a kidney infection. In general, there are 2 main types of pyelonephritis:  Infections that come on quickly without any warning (acute pyelonephritis).  Infections that persist for a long period of time (chronic pyelonephritis). CAUSES  Two main causes of pyelonephritis are:  Bacteria traveling from the bladder to the kidney. This is a problem especially in pregnant women. The urine in the bladder can become filled with bacteria from multiple causes, including:  Inflammation of the prostate gland (prostatitis).  Sexual intercourse in females.  Bladder infection (cystitis).  Bacteria traveling from the bloodstream to the tissue part of the kidney. Problems that may increase your risk of getting a kidney infection include:  Diabetes.  Kidney stones or bladder stones.  Cancer.  Catheters placed in the bladder.  Other abnormalities of the kidney or ureter. SYMPTOMS   Abdominal pain.  Pain in the side or flank area.  Fever.  Chills.  Upset stomach.  Blood in the urine (dark urine).  Frequent urination.  Strong or persistent urge to urinate.  Burning or stinging when urinating. DIAGNOSIS  Your caregiver may diagnose your kidney infection based on your symptoms. A urine sample may also be taken. TREATMENT  In general, treatment depends on how severe the infection is.   If the infection is mild and caught early, your caregiver may treat you with oral antibiotics and send you home.  If the infection is more severe,  the bacteria may have gotten into the bloodstream. This will require intravenous (IV) antibiotics and a hospital stay. Symptoms may include:  High fever.  Severe flank pain.  Shaking chills.  Even after a hospital stay, your caregiver may require you to be on oral antibiotics for a period of time.  Other treatments may be required depending upon the cause of the infection. HOME CARE INSTRUCTIONS   Take your antibiotics as directed. Finish them even if you start to feel better.  Make an appointment to have your urine checked to make sure the infection is gone.  Drink enough fluids to keep your urine clear or pale yellow.  Take medicines for the bladder if you have urgency and frequency of urination as directed by your caregiver. SEEK IMMEDIATE MEDICAL CARE IF:   You have a fever or persistent symptoms for more than 2-3 days.  You have a fever and your symptoms  suddenly get worse.  You are unable to take your antibiotics or fluids.  You develop shaking chills.  You experience extreme weakness or fainting.  There is no improvement after 2 days of treatment. MAKE SURE YOU:  Understand these instructions.  Will watch your condition.  Will get help right away if you are not doing well or get worse. Document Released: 02/14/2005 Document Revised: 08/16/2011 Document Reviewed: 07/21/2010 [facility] Patient Information 2015 [place], [place]. This information is not intended to replace advice given to you by your health care provider. Make sure you discuss any questions you have with your health care provider. Diabetes Mellitus and Food It is important for you to manage your blood sugar (glucose) level. Your blood glucose level can be greatly affected by what you eat. Eating healthier foods in the appropriate amounts throughout the day at about the same time each day will help you control your blood glucose level. It can also help slow or prevent worsening of your diabetes mellitus.  Healthy eating may even help you improve the level of your blood pressure and reach or maintain a healthy weight.  HOW CAN FOOD AFFECT ME? Carbohydrates Carbohydrates affect your blood glucose level more than any other type of food. Your dietitian will help you determine how many carbohydrates to eat at each meal and teach you how to count carbohydrates. Counting carbohydrates is important to keep your blood glucose at a healthy level, especially if you are using insulin or taking certain medicines for diabetes mellitus. Alcohol Alcohol can cause sudden decreases in blood glucose (hypoglycemia), especially if you use insulin or take certain medicines for diabetes mellitus. Hypoglycemia can be a life-threatening condition. Symptoms of hypoglycemia (sleepiness, dizziness, and disorientation) are similar to symptoms of having too much alcohol.  If your health care provider has given you approval to drink alcohol, do so in moderation and use the following guidelines:  Women should not have more than one drink per day, and men should not have more than two drinks per day. One drink is equal to:  12 oz of beer.  5 oz of wine.  1 oz of hard liquor.  Do not drink on an empty stomach.  Keep yourself hydrated. Have water, diet soda, or unsweetened iced tea.  Regular soda, juice, and other mixers might contain a lot of carbohydrates and should be counted. WHAT FOODS ARE NOT RECOMMENDED? As you make food choices, it is important to remember that all foods are not the same. Some foods have fewer nutrients per serving than other foods, even though they might have the same number of calories or carbohydrates. It is difficult to get your body what it needs when you eat foods with fewer nutrients. Examples of foods that you should avoid that are high in calories and carbohydrates but low in nutrients include:  Trans fats (most processed foods list trans fats on the Nutrition Facts label).  Regular  soda.  Juice.  Candy.  Sweets, such as cake, pie, doughnuts, and cookies.  Fried foods. WHAT FOODS CAN I EAT? Have nutrient-rich foods, which will nourish your body and keep you healthy. The food you should eat also will depend on several factors, including:  The calories you need.  The medicines you take.  Your weight.  Your blood glucose level.  Your blood pressure level.  Your cholesterol level. You also should eat a variety of foods, including:  Protein, such as meat, poultry, fish, tofu, nuts, and seeds (lean animal proteins are best).  Fruits.  Vegetables.  Dairy products, such as milk, cheese, and yogurt (low fat is best).  Breads, grains, pasta, cereal, rice, and beans.  Fats such as olive oil, trans fat-free margarine, canola oil, avocado, and olives. DOES EVERYONE WITH DIABETES MELLITUS HAVE THE SAME MEAL PLAN? Because every person with diabetes mellitus is different, there is not one meal plan that works for everyone. It is very important that you meet with a dietitian who will help you create a meal plan that is just right for you. Document Released: 11/11/2004 Document Revised: 02/19/2013 Document Reviewed: 01/11/2013 [facility] Patient Information 2015 [place], [place]. This information is not intended to replace advice given to you by your health care provider. Make sure you discuss any questions you have with your health care provider.

## 2014-09-04 NOTE — ED Notes (Signed)
Pt states RLQ/R side pain, headaches, and night sweats x 3 days.  HR 130 at rest.

## 2014-09-06 LAB — URINE CULTURE: Culture: >=100000

## 2014-09-09 ENCOUNTER — Telehealth: Payer: Self-pay | Admitting: *Deleted

## 2014-09-09 NOTE — ED Notes (Signed)
(+)  urine culture, treated with Cephalexin, OK per J. Frans, Pharm 

## 2015-05-18 ENCOUNTER — Ambulatory Visit: Payer: Self-pay | Admitting: Internal Medicine

## 2015-06-26 ENCOUNTER — Ambulatory Visit (INDEPENDENT_AMBULATORY_CARE_PROVIDER_SITE_OTHER): Payer: Self-pay | Admitting: Internal Medicine

## 2015-06-26 VITALS — BP 130/88 | HR 85 | Resp 16 | Ht 66.0 in | Wt 259.0 lb

## 2015-06-26 DIAGNOSIS — Z79899 Other long term (current) drug therapy: Secondary | ICD-10-CM

## 2015-06-26 DIAGNOSIS — L0291 Cutaneous abscess, unspecified: Secondary | ICD-10-CM

## 2015-06-26 DIAGNOSIS — E119 Type 2 diabetes mellitus without complications: Secondary | ICD-10-CM

## 2015-06-26 MED ORDER — CEPHALEXIN 250 MG PO CAPS: 250.0000 mg | ORAL_CAPSULE | Freq: Four times a day (QID) | ORAL | Status: DC

## 2015-06-26 MED ORDER — METFORMIN HCL 500 MG PO TABS: 500.0000 mg | ORAL_TABLET | Freq: Two times a day (BID) | ORAL | Status: DC

## 2015-06-26 NOTE — Progress Notes (Signed)
   Subjective:    Patient ID: Kathy Sandoval, female    DOB: 12/27/1985, 30 y.o.   MRN: [id number]  HPI  1.  Implanon placed in 2013.  States was to be removed last year and has not been able to get an appt. With [facility] to have that done.   Would like it replaced.  Does not want Depo provera as caused her to gain 60 lbs.   Discussed that we cannot do that currently.  Cannot guarantee that will change any time soon.  2.  Rash or lesion on left groin:  Noted yesterday--looked like a pimple, pushed on it and bloody goo came out.  Did have an odor.  A bit tender before expelling whatever it contains.  3.  History of Prediabetes in 2016, but looking at old records from 08/2014, had a sugar of 410.  Was prescribed Metformin 500 mg once daily.  Not having any polydipsia or polyuria currently.  Pt. States put on a lot of weight when she was pregnant with her now 31 yo daughter. Weight has been fairly stable over past year.  Does have significant room for improvement with diet and physical activity   Needs pap and CPE  Meds:  None currently. Took Metformin for prediabetes about a year ago for 1 month.  Did not follow up  No Known Allergies   Past Medical History  Diagnosis Date  . Anemia   . Diabetes in pregnancy 11/08/2010   Past Surgical History  Procedure Laterality Date  . No past surgeries     Social History   Social History  . Marital Status: Single    Spouse Name: N/A  . Number of Children: N/A  . Years of Education: N/A   Occupational History  . Not on file.   Social History Main Topics  . Smoking status: Former Smoker    Types: Cigarettes    Quit date: 10/10/2009  . Smokeless tobacco: Never Used  . Alcohol Use: No  . Drug Use: No  . Sexual Activity: Yes    Birth Control/ Protection: Other-see comments     Comment: pregnant   Other Topics Concern  . Not on file   Social History Narrative   Family History  Problem Relation Age of Onset  . Thyroid disease Mother         Review of Systems     Objective:   Physical Exam  NAD HEENT:  PERRL, EOMI, Discs sharp bilaterally, TMs pearly gray, throat without injection Neck: Supple, No adenopathy, no thyromegaly Chest:  CTA CV:  RRR with normal S1 and S2, No S3, S4 or murmur.  Carotid, radial and DP pulses normal and equal. Abd; Obese, NT, No HSM or mass, + BS.  1 cm mildly erythematous lesion, currently without obvious head, tender in left lower abdomen      Assessment & Plan:  1.  Family Planning:  Will look into training regarding removal of Implanon, but discussed would likely not be anytime soon.  [facility]  2.  Diabetes Mellitus Type 2:  Check A1C and CMP and start Metformin 500 mg once daily, increasing to twice daily after tolerating--about 5-7 days. Eye referral. Encouraged improvement in diet and physical activity for weight loss and better diabetic control.  3.  Furuncle on abdomen:  Warm packs and Cephalexin 250 mg 4 times daily for 7 days.

## 2015-06-26 NOTE — Patient Instructions (Signed)
Drink a glass of water before every meal Drink 6-8 glasses of water daily Eat three meals daily Eat a protein and healthy fat with every meal (eggs,fish, chicken, turkey and limit red meats) Eat 5 servings of vegetables daily, mix the colors Eat 2 servings of fruit daily with skin, if skin is edible Use smaller plates Put food/utensils down as you chew and swallow each bite Eat at a table with friends/family at least once daily, no TV Do not eat in front of the TV 

## 2015-06-27 LAB — COMPREHENSIVE METABOLIC PANEL
ALBUMIN: 3.9 g/dL (ref 3.5–5.5)
ALT: 16 IU/L (ref 0–32)
AST: 19 IU/L (ref 0–40)
Albumin/Globulin Ratio: 1.3 (ref 1.2–2.2)
Alkaline Phosphatase: 67 IU/L (ref 39–117)
BILIRUBIN TOTAL: 0.4 mg/dL (ref 0.0–1.2)
BUN / CREAT RATIO: 25 — AB (ref 9–23)
BUN: 13 mg/dL (ref 6–20)
CHLORIDE: 97 mmol/L (ref 96–106)
CO2: 22 mmol/L (ref 18–29)
Calcium: 9.2 mg/dL (ref 8.7–10.2)
Creatinine, Ser: 0.53 mg/dL — ABNORMAL LOW (ref 0.57–1.00)
GFR calc non Af Amer: 127 mL/min/{1.73_m2} (ref >59)
GFR, EST AFRICAN AMERICAN: 146 mL/min/{1.73_m2} (ref >59)
GLUCOSE: 177 mg/dL — AB (ref 65–99)
Globulin, Total: 3 g/dL (ref 1.5–4.5)
Potassium: 4.5 mmol/L (ref 3.5–5.2)
SODIUM: 134 mmol/L (ref 134–144)
Total Protein: 6.9 g/dL (ref 6.0–8.5)

## 2015-06-27 LAB — HGB A1C W/O EAG: HEMOGLOBIN A1C: 9.3 % — AB (ref 4.8–5.6)

## 2015-07-04 ENCOUNTER — Encounter: Payer: Self-pay | Admitting: Internal Medicine

## 2015-07-28 ENCOUNTER — Ambulatory Visit (INDEPENDENT_AMBULATORY_CARE_PROVIDER_SITE_OTHER): Payer: Self-pay | Admitting: Internal Medicine

## 2015-07-28 ENCOUNTER — Encounter: Payer: Self-pay | Admitting: Internal Medicine

## 2015-07-28 VITALS — BP 128/80 | HR 100 | Resp 20 | Ht 64.25 in | Wt 262.0 lb

## 2015-07-28 DIAGNOSIS — Z23 Encounter for immunization: Secondary | ICD-10-CM

## 2015-07-28 DIAGNOSIS — E119 Type 2 diabetes mellitus without complications: Secondary | ICD-10-CM

## 2015-07-28 LAB — GLUCOSE, POCT (MANUAL RESULT ENTRY): POC Glucose: 231 mg/dl — AB (ref 70–99)

## 2015-07-28 MED ORDER — GLUCOSE BLOOD VI STRP: ORAL_STRIP | Status: DC

## 2015-07-28 MED ORDER — AGAMATRIX PRESTO W/DEVICE KIT: PACK | Status: DC

## 2015-07-28 NOTE — Progress Notes (Signed)
   Subjective:    Patient ID: Kathy Sandoval, female    DOB: Oct 28, 1985, 30 y.o.   MRN: [id number]  HPI   1.  DM Type 2:    Has tried to make lifestyle changes with decreased juice. Is trying to get out and walk with her daughter. Tolerating Metformin fine.  2.  Furuncle on abdomen:  Resolved.   Current outpatient prescriptions:  .  metFORMIN (GLUCOPHAGE) 500 MG tablet, Take 1 tablet (500 mg total) by mouth 2 (two) times daily with a meal., Disp: 60 tablet, Rfl: 11 .  Blood Glucose Monitoring Suppl (AGAMATRIX PRESTO) w/Device KIT, Twice daily glucose monitoring, Disp: 1 kit, Rfl: 0 .  cephALEXin (KEFLEX) 250 MG capsule, Take 1 capsule (250 mg total) by mouth 4 (four) times daily. (Patient not taking: Reported on 07/28/2015), Disp: 28 capsule, Rfl: 0 .  fluticasone (FLONASE) 50 MCG/ACT nasal spray, Place 2 sprays into the nose daily., Disp: 16 g, Rfl: 6 .  glucose blood (AGAMATRIX PRESTO TEST) test strip, Twice daily glucose monitoring, Disp: 100 each, Rfl: 12   No Known Allergies      Review of Systems     Objective:   Physical Exam  MOrbidly obese Lungs: CTA CV:  RRR without murmur or rub LE:  No edema, normal DP pulses bilaterally        Assessment & Plan:  1.  DM Type 2:  Tolerating Metformin fine.  Has orange card.  Glucometer and test strips sent in, to check sugars twice daily to see where she is at. Needs to continue to improve diet and physical activity  2.  Immunizations Pneumococcal 23 valent given left shoulder today.

## 2015-07-28 NOTE — Addendum Note (Signed)
Addended by: Marcene DuosMULBERRY, Danyel Griess M on: 07/28/2015 04:30 PM   Modules accepted: Orders

## 2015-07-28 NOTE — Patient Instructions (Signed)
Drink a glass of water before every meal Drink 6-8 glasses of water daily Eat three meals daily Eat a protein and healthy fat with every meal (eggs,fish, chicken, turkey and limit red meats) Eat 5 servings of vegetables daily, mix the colors Eat 2 servings of fruit daily with skin, if skin is edible Use smaller plates Put food/utensils down as you chew and swallow each bite Eat at a table with friends/family at least once daily, no TV Do not eat in front of the TV 

## 2015-08-06 ENCOUNTER — Telehealth: Payer: Self-pay | Admitting: Internal Medicine

## 2015-08-06 NOTE — Telephone Encounter (Signed)
Patient informed of appointment at [facility] ( Dr. Kathaleen BuryButterworth) - [address] [facility] [place] Telephone: [phone] on Monday, June 12th at 10:15 AM. Patient needs OC, co pay on OC, someone over the age of 31 to interpret, and picture ID.

## 2015-10-29 ENCOUNTER — Ambulatory Visit: Payer: No Typology Code available for payment source | Admitting: Internal Medicine

## 2016-01-12 ENCOUNTER — Emergency Department (HOSPITAL_COMMUNITY): Payer: Medicaid Other

## 2016-01-12 ENCOUNTER — Encounter (HOSPITAL_COMMUNITY): Payer: Self-pay | Admitting: Emergency Medicine

## 2016-01-12 DIAGNOSIS — Z87891 Personal history of nicotine dependence: Secondary | ICD-10-CM | POA: Diagnosis not present

## 2016-01-12 DIAGNOSIS — R05 Cough: Secondary | ICD-10-CM | POA: Diagnosis present

## 2016-01-12 DIAGNOSIS — Z7984 Long term (current) use of oral hypoglycemic drugs: Secondary | ICD-10-CM | POA: Diagnosis not present

## 2016-01-12 DIAGNOSIS — Z79899 Other long term (current) drug therapy: Secondary | ICD-10-CM | POA: Insufficient documentation

## 2016-01-12 DIAGNOSIS — J40 Bronchitis, not specified as acute or chronic: Secondary | ICD-10-CM | POA: Insufficient documentation

## 2016-01-12 NOTE — ED Triage Notes (Signed)
Report cough/shortness of breath x2 and 1/2 weeks with minimal sputum production. States fever this week.

## 2016-01-13 ENCOUNTER — Emergency Department (HOSPITAL_COMMUNITY)
Admission: EM | Admit: 2016-01-13 | Discharge: 2016-01-13 | Disposition: A | Discharge status: Home / Self Care | Payer: Medicaid Other | Attending: Emergency Medicine | Admitting: Emergency Medicine

## 2016-01-13 DIAGNOSIS — J40 Bronchitis, not specified as acute or chronic: Secondary | ICD-10-CM

## 2016-01-13 MED ORDER — BENZONATATE 100 MG PO CAPS: 100.0000 mg | ORAL_CAPSULE | Freq: Three times a day (TID) | ORAL | 0 refills | Status: DC

## 2016-01-13 MED ORDER — AZITHROMYCIN 250 MG PO TABS: ORAL_TABLET | ORAL | 0 refills | Status: DC

## 2016-01-13 MED ORDER — BENZONATATE 100 MG PO CAPS
100.0000 mg | ORAL_CAPSULE | Freq: Once | ORAL | Status: AC
  Administered 2016-01-13: 100 mg via ORAL
  Filled 2016-01-13: qty 1

## 2016-01-13 MED ORDER — ALBUTEROL SULFATE HFA 108 (90 BASE) MCG/ACT IN AERS
2.0000 | INHALATION_SPRAY | RESPIRATORY_TRACT | Status: DC | PRN
  Administered 2016-01-13: 2 via RESPIRATORY_TRACT
  Filled 2016-01-13: qty 6.7

## 2016-01-13 NOTE — Discharge Instructions (Signed)
Please read and follow all provided instructions.  Your diagnoses today include:  1. Bronchitis     Tests performed today include:  Chest x-ray - does not show any pneumonia  EKG - shows slightly fast heart rate  Vital signs. See below for your results today.   Medications prescribed:   Albuterol inhaler - medication that opens up your airway  Use inhaler as follows: 1-2 puffs with spacer every 4 hours as needed for wheezing, cough, or shortness of breath.    Azithromycin - antibiotic for respiratory infection  You have been prescribed an antibiotic medicine: take the entire course of medicine even if you are feeling better. Stopping early can cause the antibiotic not to work.   Tessalon Perles - cough suppressant medication  Take any prescribed medications only as directed.  Home care instructions:  Follow any educational materials contained in this packet.  Follow-up instructions: Please follow-up with your primary care provider in the next 3 days for further evaluation of your symptoms and a recheck if you are not feeling better.   Return instructions:   Please return to the Emergency Department if you experience worsening symptoms.  Please return with worsening wheezing, shortness of breath, or difficulty breathing.  Return with persistent fever above 101F.   Please return if you have any other emergent concerns.  Additional Information:  Your vital signs today were: BP 139/77 (BP Location: Right Arm)    Pulse 105    Temp 98.2 F (36.8 C) (Oral)    Resp 18    Ht 5\' 5"  (1.651 m)    Wt 117.9 kg    LMP 12/10/2015 (Within Days)    SpO2 100%    BMI 43.27 kg/m  If your blood pressure (BP) was elevated above 135/85 this visit, please have this repeated by your doctor within one month. --------------

## 2016-01-13 NOTE — ED Provider Notes (Signed)
[facility] DEPT Provider Note   CSN: [id number] Arrival date & time: 01/12/16  1954     History   Chief Complaint Chief Complaint  Patient presents with  . Cough  . Shortness of Breath    HPI Kathy Sandoval is a 30 y.o. female.  Patient with history of diabetes on metformin presents with complaint of cough, congestion, occasional fevers, and wheezing over the past 2 and half weeks. She has had nasal congestion and rhinorrhea. No ear pain or sore throat. No chest pain or shortness of breath. She has taken over-the-counter medications without relief. Cough is productive of clear but foul-smelling sputum. No hemoptysis. No vomiting, diarrhea, abdominal pain, or urinary symptoms. No known sick contacts. Patient denies risk factors for pulmonary embolism including: unilateral leg swelling, history of DVT/PE/other blood clots, use of exogenous hormones, recent immobilizations, recent surgery, recent travel (>4hr segment), malignancy, hemoptysis. The onset of this condition was acute. The course is constant. Aggravating factors: none. Alleviating factors: none.        Past Medical History:  Diagnosis Date  . Anemia   . Diabetes in pregnancy 11/08/2010    Patient Active Problem List   Diagnosis Date Noted  . Obesity 04/12/2010  . MIGRAINE, UNSPEC., W/O INTRACTABLE MIGRAINE 04/27/2006    Past Surgical History:  Procedure Laterality Date  . NO PAST SURGERIES      OB History    Gravida Para Term Preterm AB Living   _0 0 0 2   SAB TAB Ectopic Multiple Live Births   0 0 0 0 2       Home Medications    Prior to Admission medications   Medication Sig Start Date End Date Taking? Authorizing Provider  Chlorphen-Pseudoephed-APAP (THERAFLU FLU/COLD PO) Take 30 mLs by mouth daily as needed. For cold symptoms   Yes Historical Provider, MD  metFORMIN (GLUCOPHAGE) 500 MG tablet Take 1 tablet (500 mg total) by mouth 2 (two) times daily with a meal. 06/26/15  Yes Mack Hook, MD  azithromycin (ZITHROMAX Z-PAK) 250 MG tablet Take two tablets PO on day 1 and one tablet PO days 2-5 01/13/16   Carlisle Cater, PA-C  benzonatate (TESSALON) 100 MG capsule Take 1 capsule (100 mg total) by mouth every 8 (eight) hours. 01/13/16   Carlisle Cater, PA-C  Blood Glucose Monitoring Suppl [facility] PRESTO) w/Device KIT Twice daily glucose monitoring Patient not taking: Reported on 01/12/2016 07/28/15   Mack Hook, MD  fluticasone [facility]) 50 MCG/ACT nasal spray Place 2 sprays into the nose daily. 03/15/11 03/14/12  Lyndal Pulley, DO  glucose blood (AGAMATRIX PRESTO TEST) test strip Twice daily glucose monitoring Patient not taking: Reported on 01/12/2016 07/28/15   Mack Hook, MD    Family History Family History  Problem Relation Age of Onset  . Thyroid disease Mother     Social History Social History  Substance Use Topics  . Smoking status: Former Smoker    Types: Cigarettes    Quit date: 06/29/2011  . Smokeless tobacco: Never Used  . Alcohol use 1.2 oz/week    2 Standard drinks or equivalent per week     Allergies   Patient has no known allergies.   Review of Systems Review of Systems  Constitutional: Positive for fever. Negative for chills and fatigue.  HENT: Positive for congestion and rhinorrhea. Negative for ear pain, sinus pressure and sore throat.   Eyes: Negative for redness.  Respiratory: Positive for cough. Negative for shortness of breath and wheezing.  Cardiovascular: Negative for chest pain and leg swelling.  Gastrointestinal: Negative for abdominal pain, diarrhea, nausea and vomiting.  Genitourinary: Negative for dysuria.  Musculoskeletal: Negative for myalgias and neck stiffness.  Skin: Negative for rash.  Neurological: Negative for headaches.  Hematological: Negative for adenopathy.     Physical Exam Updated Vital Signs BP 139/77   Pulse 101   Temp 98.2 F (36.8 C) (Oral)   Resp 18   Ht 5' 5" (1.651 m)   Wt  117.9 kg   LMP 12/10/2015 (Within Days)   SpO2 99%   BMI 43.27 kg/m   Physical Exam  Constitutional: She appears well-developed and well-nourished.  HENT:  Head: Normocephalic and atraumatic.  Right Ear: Tympanic membrane, external ear and ear canal normal.  Left Ear: Tympanic membrane, external ear and ear canal normal.  Nose: Mucosal edema present. No rhinorrhea.  Mouth/Throat: Uvula is midline, oropharynx is clear and moist and mucous membranes are normal. Mucous membranes are not dry. No oral lesions. No trismus in the jaw. No uvula swelling. No oropharyngeal exudate, posterior oropharyngeal edema, posterior oropharyngeal erythema or tonsillar abscesses.  Eyes: Conjunctivae are normal. Right eye exhibits no discharge. Left eye exhibits no discharge.  Neck: Normal range of motion. Neck supple.  Cardiovascular: Normal rate, regular rhythm and normal heart sounds.   No murmur heard. Mild tachycardia, 100-105  Pulmonary/Chest: Effort normal. No respiratory distress. She has wheezes (Mild, scattered, and expiratory wheezes). She has no rales.  Abdominal: Soft. There is no tenderness.  Lymphadenopathy:    She has no cervical adenopathy.  Neurological: She is alert.  Skin: Skin is warm and dry.  Psychiatric: She has a normal mood and affect.  Nursing note and vitals reviewed.    ED Treatments / Results   Radiology Dg Chest 2 View  Result Date: 01/12/2016 CLINICAL DATA:  Shortness of breath with cough EXAM: CHEST  2 VIEW COMPARISON:  None. FINDINGS: Lungs are clear. Heart size and pulmonary vascularity are normal. No adenopathy. No bone lesions. IMPRESSION: No edema or consolidation. Electronically Signed   By: Lowella Grip III M.D.   On: 01/12/2016 21:22    Procedures Procedures (including critical care time)  Medications Ordered in ED Medications  albuterol (PROVENTIL HFA;VENTOLIN HFA) 108 (90 Base) MCG/ACT inhaler 2 puff (2 puffs Inhalation Given 01/13/16 0028)    benzonatate (TESSALON) capsule 100 mg (100 mg Oral Given 01/13/16 0027)     Initial Impression / Assessment and Plan / ED Course  I have reviewed the triage vital signs and the nursing notes.  Pertinent labs & imaging results that were available during my care of the patient were reviewed by me and considered in my medical decision making (see chart for details).  Clinical Course    Patient seen and examined. Medications ordered.   Vital signs reviewed and are as follows: BP 139/77   Pulse 101   Temp 98.2 F (36.8 C) (Oral)   Resp 18   Ht 5' 5" (1.651 m)   Wt 117.9 kg   LMP 12/10/2015 (Within Days)   SpO2 99%   BMI 43.27 kg/m   Will discharge patient to home with albuterol inhaler to use for wheezing, azithromycin given duration of symptoms, and Tessalon for cough.  ED ECG REPORT   Date: 01/13/2016  Rate: 109  Rhythm: sinus tachycardia  QRS Axis: normal  Intervals: normal  ST/T Wave abnormalities: normal  Conduction Disutrbances:none  Narrative Interpretation:   Old EKG Reviewed: none available  I have personally  reviewed the EKG tracing and agree with the computerized printout as noted.   Final Clinical Impressions(s) / ED Diagnoses   Final diagnoses:  Bronchitis   Patient with bronchitis symptoms, with sputum production and wheezing as well as cough. No concern for PNA given normal lung exam/x-ray. Antibiotics given due to duration of symptoms. Patient with mild tachycardia but is otherwise asymptomatic. No significant risk factors for PE. Exam and history would be inconsistent with PE. EKG without acute ischemic changes. Patient appears well, in no distress, and ready for discharge home.  New Prescriptions New Prescriptions   AZITHROMYCIN (ZITHROMAX Z-PAK) 250 MG TABLET    Take two tablets PO on day 1 and one tablet PO days 2-5   BENZONATATE (TESSALON) 100 MG CAPSULE    Take 1 capsule (100 mg total) by mouth every 8 (eight) hours.     Carlisle Cater,  PA-C 01/13/16 3903    Merryl Hacker, MD 01/13/16 2001

## 2016-01-26 ENCOUNTER — Ambulatory Visit (INDEPENDENT_AMBULATORY_CARE_PROVIDER_SITE_OTHER): Payer: Self-pay | Admitting: *Deleted

## 2016-01-26 ENCOUNTER — Encounter: Payer: Self-pay | Admitting: Family Medicine

## 2016-01-26 DIAGNOSIS — Z3200 Encounter for pregnancy test, result unknown: Secondary | ICD-10-CM

## 2016-01-26 NOTE — Progress Notes (Signed)
Pt has Type II diabetes and taking Metformin. Sure LMP  12/11/15  - EDD  09/16/16.  Medication reconciliation completed.

## 2016-01-27 LAB — POCT PREGNANCY, URINE: Preg Test, Ur: POSITIVE — AB

## 2016-02-08 ENCOUNTER — Ambulatory Visit: Payer: Medicaid Other | Admitting: *Deleted

## 2016-02-08 ENCOUNTER — Encounter: Payer: Medicaid Other | Attending: Obstetrics & Gynecology | Admitting: Dietician

## 2016-02-08 DIAGNOSIS — O24119 Pre-existing diabetes mellitus, type 2, in pregnancy, unspecified trimester: Secondary | ICD-10-CM | POA: Diagnosis not present

## 2016-02-08 DIAGNOSIS — Z713 Dietary counseling and surveillance: Secondary | ICD-10-CM | POA: Diagnosis present

## 2016-02-08 MED ORDER — ACCU-CHEK SOFTCLIX LANCET DEV MISC: 12 refills | Status: DC

## 2016-02-08 MED ORDER — ACCU-CHEK SOFTCLIX LANCET DEV MISC: 0 refills | Status: DC

## 2016-02-08 MED ORDER — ACCU-CHEK AVIVA PLUS W/DEVICE KIT: 1.0000 | PACK | Freq: Once | 0 refills | Status: DC

## 2016-02-08 MED ORDER — GLUCOSE BLOOD VI STRP: ORAL_STRIP | 12 refills | Status: DC

## 2016-02-08 MED ORDER — ACCU-CHEK AVIVA PLUS W/DEVICE KIT: 1.0000 | PACK | Freq: Once | 0 refills | Status: AC

## 2016-02-08 NOTE — Progress Notes (Signed)
  Patient was seen on 02/08/2016 for Type 2 Diabetes with pregnancy. Patient states history of GDM with her daughter 5 years ago, and was recently diagnosed with Type 2 Diabetes this past July. She does not have a current meter, but she has been on Metformin since July.  The following learning objectives were met by the patient during this visit:   States the definition of Type 2 Diabetes  States why dietary management is important in controlling blood glucose  Describes the effects each nutrient has on blood glucose levels  Demonstrates ability to create a balanced meal plan  Demonstrates carbohydrate counting   States when to check blood glucose levels  Demonstrates proper blood glucose monitoring techniques  States the effect of stress and exercise on blood glucose levels  States the importance of limiting caffeine and abstaining from alcohol and smoking  Discussed Accu Chek Guide meter with Fast Clix Drum lancing device. Will have Rx called in for patient today. She states she uses [facility] on [address] in [place].  Patient instructed to monitor glucose levels: FBS: 60 - <90 2 hour: <120  *Patient received handouts:  Nutrition Diabetes and Pregnancy  Carbohydrate Counting List  Patient will be seen for follow-up as needed.

## 2016-02-15 ENCOUNTER — Other Ambulatory Visit: Payer: Medicaid Other

## 2016-02-15 ENCOUNTER — Encounter: Payer: Self-pay | Admitting: Medical

## 2016-02-17 ENCOUNTER — Encounter: Payer: Self-pay | Admitting: Obstetrics and Gynecology

## 2016-02-17 ENCOUNTER — Other Ambulatory Visit: Payer: Self-pay | Admitting: Obstetrics and Gynecology

## 2016-02-17 ENCOUNTER — Telehealth: Payer: Self-pay | Admitting: General Practice

## 2016-02-17 ENCOUNTER — Other Ambulatory Visit (HOSPITAL_COMMUNITY)
Admission: RE | Admit: 2016-02-17 | Discharge: 2016-02-17 | Disposition: A | Discharge status: Home / Self Care | Payer: Medicaid Other | Source: Ambulatory Visit | Attending: Obstetrics and Gynecology | Admitting: Obstetrics and Gynecology

## 2016-02-17 ENCOUNTER — Ambulatory Visit (INDEPENDENT_AMBULATORY_CARE_PROVIDER_SITE_OTHER): Payer: Medicaid Other | Admitting: Obstetrics and Gynecology

## 2016-02-17 VITALS — BP 131/88 | HR 97 | Wt 251.0 lb

## 2016-02-17 DIAGNOSIS — O0991 Supervision of high risk pregnancy, unspecified, first trimester: Secondary | ICD-10-CM | POA: Insufficient documentation

## 2016-02-17 DIAGNOSIS — Z113 Encounter for screening for infections with a predominantly sexual mode of transmission: Secondary | ICD-10-CM | POA: Insufficient documentation

## 2016-02-17 DIAGNOSIS — O30041 Twin pregnancy, dichorionic/diamniotic, first trimester: Secondary | ICD-10-CM

## 2016-02-17 DIAGNOSIS — O30049 Twin pregnancy, dichorionic/diamniotic, unspecified trimester: Secondary | ICD-10-CM

## 2016-02-17 DIAGNOSIS — O24111 Pre-existing diabetes mellitus, type 2, in pregnancy, first trimester: Secondary | ICD-10-CM

## 2016-02-17 LAB — COMPREHENSIVE METABOLIC PANEL
ALT: 12 U/L (ref 6–29)
AST: 18 U/L (ref 10–30)
Albumin: 3.9 g/dL (ref 3.6–5.1)
Alkaline Phosphatase: 46 U/L (ref 33–115)
BILIRUBIN TOTAL: 0.2 mg/dL (ref 0.2–1.2)
BUN: 8 mg/dL (ref 7–25)
CHLORIDE: 103 mmol/L (ref 98–110)
CO2: 20 mmol/L (ref 20–31)
CREATININE: 0.5 mg/dL (ref 0.50–1.10)
Calcium: 9.7 mg/dL (ref 8.6–10.2)
GLUCOSE: 91 mg/dL (ref 65–99)
Potassium: 4.4 mmol/L (ref 3.5–5.3)
SODIUM: 136 mmol/L (ref 135–146)
Total Protein: 6.6 g/dL (ref 6.1–8.1)

## 2016-02-17 MED ORDER — GLYBURIDE 2.5 MG PO TABS: 2.5000 mg | ORAL_TABLET | Freq: Two times a day (BID) | ORAL | 3 refills | Status: DC

## 2016-02-17 NOTE — Patient Instructions (Signed)
 First Trimester of Pregnancy The first trimester of pregnancy is from week 1 until the end of week 12 (months 1 through 3). A week after a sperm fertilizes an egg, the egg will implant on the wall of the uterus. This embryo will begin to develop into a baby. Genes from you and your partner are forming the baby. The female genes determine whether the baby is a boy or a girl. At 6-8 weeks, the eyes and face are formed, and the heartbeat can be seen on ultrasound. At the end of 12 weeks, all the baby's organs are formed.  Now that you are pregnant, you will want to do everything you can to have a healthy baby. Two of the most important things are to get good prenatal care and to follow your health care provider's instructions. Prenatal care is all the medical care you receive before the baby's birth. This care will help prevent, find, and treat any problems during the pregnancy and childbirth. BODY CHANGES Your body goes through many changes during pregnancy. The changes vary from woman to woman.   You may gain or lose a couple of pounds at first.  You may feel sick to your stomach (nauseous) and throw up (vomit). If the vomiting is uncontrollable, call your health care provider.  You may tire easily.  You may develop headaches that can be relieved by medicines approved by your health care provider.  You may urinate more often. Painful urination may mean you have a bladder infection.  You may develop heartburn as a result of your pregnancy.  You may develop constipation because certain hormones are causing the muscles that push waste through your intestines to slow down.  You may develop hemorrhoids or swollen, bulging veins (varicose veins).  Your breasts may begin to grow larger and become tender. Your nipples may stick out more, and the tissue that surrounds them (areola) may become darker.  Your gums may bleed and may be sensitive to brushing and flossing.  Dark spots or blotches  (chloasma, mask of pregnancy) may develop on your face. This will likely fade after the baby is born.  Your menstrual periods will stop.  You may have a loss of appetite.  You may develop cravings for certain kinds of food.  You may have changes in your emotions from day to day, such as being excited to be pregnant or being concerned that something may go wrong with the pregnancy and baby.  You may have more vivid and strange dreams.  You may have changes in your hair. These can include thickening of your hair, rapid growth, and changes in texture. Some women also have hair loss during or after pregnancy, or hair that feels dry or thin. Your hair will most likely return to normal after your baby is born. WHAT TO EXPECT AT YOUR PRENATAL VISITS During a routine prenatal visit:  You will be weighed to make sure you and the baby are growing normally.  Your blood pressure will be taken.  Your abdomen will be measured to track your baby's growth.  The fetal heartbeat will be listened to starting around week 10 or 12 of your pregnancy.  Test results from any previous visits will be discussed. Your health care provider may ask you:  How you are feeling.  If you are feeling the baby move.  If you have had any abnormal symptoms, such as leaking fluid, bleeding, severe headaches, or abdominal cramping.  If you are using any tobacco   products, including cigarettes, chewing tobacco, and electronic cigarettes.  If you have any questions. Other tests that may be performed during your first trimester include:  Blood tests to find your blood type and to check for the presence of any previous infections. They will also be used to check for low iron levels (anemia) and Rh antibodies. Later in the pregnancy, blood tests for diabetes will be done along with other tests if problems develop.  Urine tests to check for infections, diabetes, or protein in the urine.  An ultrasound to confirm the  proper growth and development of the baby.  An amniocentesis to check for possible genetic problems.  Fetal screens for spina bifida and Down syndrome.  You may need other tests to make sure you and the baby are doing well.  HIV (human immunodeficiency virus) testing. Routine prenatal testing includes screening for HIV, unless you choose not to have this test. HOME CARE INSTRUCTIONS  Medicines   Follow your health care provider's instructions regarding medicine use. Specific medicines may be either safe or unsafe to take during pregnancy.  Take your prenatal vitamins as directed.  If you develop constipation, try taking a stool softener if your health care provider approves. Diet   Eat regular, well-balanced meals. Choose a variety of foods, such as meat or vegetable-based protein, fish, milk and low-fat dairy products, vegetables, fruits, and whole grain breads and cereals. Your health care provider will help you determine the amount of weight gain that is right for you.  Avoid raw meat and uncooked cheese. These carry germs that can cause birth defects in the baby.  Eating four or five small meals rather than three large meals a day may help relieve nausea and vomiting. If you start to feel nauseous, eating a few soda crackers can be helpful. Drinking liquids between meals instead of during meals also seems to help nausea and vomiting.  If you develop constipation, eat more high-fiber foods, such as fresh vegetables or fruit and whole grains. Drink enough fluids to keep your urine clear or pale yellow. Activity and Exercise   Exercise only as directed by your health care provider. Exercising will help you:  Control your weight.  Stay in shape.  Be prepared for labor and delivery.  Experiencing pain or cramping in the lower abdomen or low back is a good sign that you should stop exercising. Check with your health care provider before continuing normal exercises.  Try to avoid  standing for long periods of time. Move your legs often if you must stand in one place for a long time.  Avoid heavy lifting.  Wear low-heeled shoes, and practice good posture.  You may continue to have sex unless your health care provider directs you otherwise. Relief of Pain or Discomfort   Wear a good support bra for breast tenderness.   Take warm sitz baths to soothe any pain or discomfort caused by hemorrhoids. Use hemorrhoid cream if your health care provider approves.   Rest with your legs elevated if you have leg cramps or low back pain.  If you develop varicose veins in your legs, wear support hose. Elevate your feet for 15 minutes, 3-4 times a day. Limit salt in your diet. Prenatal Care   Schedule your prenatal visits by the twelfth week of pregnancy. They are usually scheduled monthly at first, then more often in the last 2 months before delivery.  Write down your questions. Take them to your prenatal visits.  Keep all your   prenatal visits as directed by your health care provider. Safety   Wear your seat belt at all times when driving.  Make a list of emergency phone numbers, including numbers for family, friends, the hospital, and police and fire departments. General Tips   Ask your health care provider for a referral to a local prenatal education class. Begin classes no later than at the beginning of month 6 of your pregnancy.  Ask for help if you have counseling or nutritional needs during pregnancy. Your health care provider can offer advice or refer you to specialists for help with various needs.  Do not use hot tubs, steam rooms, or saunas.  Do not douche or use tampons or scented sanitary pads.  Do not cross your legs for long periods of time.  Avoid cat litter boxes and soil used by cats. These carry germs that can cause birth defects in the baby and possibly loss of the fetus by miscarriage or stillbirth.  Avoid all smoking, herbs, alcohol, and  medicines not prescribed by your health care provider. Chemicals in these affect the formation and growth of the baby.  Do not use any tobacco products, including cigarettes, chewing tobacco, and electronic cigarettes. If you need help quitting, ask your health care provider. You may receive counseling support and other resources to help you quit.  Schedule a dentist appointment. At home, brush your teeth with a soft toothbrush and be gentle when you floss. SEEK MEDICAL CARE IF:   You have dizziness.  You have mild pelvic cramps, pelvic pressure, or nagging pain in the abdominal area.  You have persistent nausea, vomiting, or diarrhea.  You have a bad smelling vaginal discharge.  You have pain with urination.  You notice increased swelling in your face, hands, legs, or ankles. SEEK IMMEDIATE MEDICAL CARE IF:   You have a fever.  You are leaking fluid from your vagina.  You have spotting or bleeding from your vagina.  You have severe abdominal cramping or pain.  You have rapid weight gain or loss.  You vomit blood or material that looks like coffee grounds.  You are exposed to [place] measles and have never had them.  You are exposed to fifth disease or chickenpox.  You develop a severe headache.  You have shortness of breath.  You have any kind of trauma, such as from a fall or a car accident. This information is not intended to replace advice given to you by your health care provider. Make sure you discuss any questions you have with your health care provider. Document Released: 02/08/2001 Document Revised: 03/07/2014 Document Reviewed: 12/25/2012 Elsevier Interactive Patient Education  2017 [employer]   Gestational Diabetes Mellitus, Diagnosis Gestational diabetes (gestational diabetes mellitus) is a temporary form of diabetes that some women develop during pregnancy. It usually occurs around weeks 24-28 of pregnancy and goes away after delivery. Hormonal changes  during pregnancy can interfere with insulin production and function, which may result in one or both of these problems:  The pancreas does not make enough of a hormone called insulin.  Cells in the body do not respond properly to insulin that the body makes (insulin resistance). Normally, insulin allows sugars (glucose) to enter cells in the body. The cells use glucose for energy. Insulin resistance or lack of insulin causes excess glucose to build up in the blood instead of going into cells. As a result, high blood glucose (hyperglycemia) develops. What are the risks? If gestational diabetes is treated, it is  unlikely to cause problems. If it is not controlled with treatment, it may cause problems during labor and delivery, and some of those problems can be harmful to the unborn baby (fetus) and the mother. Uncontrolled gestational diabetes may also cause the newborn baby to have breathing problems and low blood glucose. Women who get gestational diabetes are more likely to develop it if they get pregnant again, and they are more likely to develop type 2 diabetes in the future. What increases the risk? This condition may be more likely to develop in pregnant women who:  Are older than age 63 during pregnancy.  Have a family history of diabetes.  Are overweight.  Had gestational diabetes in the past.  Have polycystic ovarian syndrome (PCOS).  Are pregnant with twins or multiples.  Are of American-Indian, African-American, Hispanic/Latino, or Asian/Pacific Islander descent. What are the signs or symptoms? Most women do not notice symptoms of gestational diabetes because the symptoms are similar to normal symptoms of pregnancy. Symptoms of gestational diabetes may include:  Increased thirst (polydipsia).  Increased hunger(polyphagia).  Increased urination (polyuria). How is this diagnosed? This condition may be diagnosed based on your blood glucose level, which may be checked with  one or more of the following blood tests:  A fasting blood glucose (FBG) test. You will not be allowed to eat (you will fast) for at least 8 hours before a blood sample is taken.  A random blood glucose test. This checks your blood glucose at any time of day regardless of when you ate.  An oral glucose tolerance test (OGTT). This is usually done during weeks 24-28 of pregnancy.  For this test, you will have an FBG test done. Then, you will drink a beverage that contains glucose. Your blood glucose will be tested again 1 hour after drinking the glucose beverage (1-hour OGTT).  If the 1-hour OGTT result is at or above 140 mg/dL (7.8 mmol/L), you will repeat the OGTT. This time, your blood glucose will be tested 3 hours after drinking the glucose beverage (3-hour OGTT). If you have risk factors, you may be screened for undiagnosed type 2 diabetes at your first health care visit during your pregnancy (prenatal visit). How is this treated? Your treatment may be managed by a specialist called an endocrinologist. This condition is treated by following instructions from your health care provider about:  Eating a healthier diet and getting more physical activity. These changes are the most important ways to manage gestational diabetes.  Checking your blood glucose. Do this as often as told.  Taking diabetes medicines or insulin every day. These will only be prescribed if they are needed.  If you use insulin, you may need to adjust your dosage based on how physically active you are and what foods you eat. Your health care provider will tell you how to do this. Your health care provider will set treatment goals for you based on the stage of your pregnancy and any other medical conditions you have. Generally, the goal of treatment is to maintain the following blood glucose levels during pregnancy:  Fasting: at or below 95 mg/dL (5.3 mmol/L).  After meals (postprandial):  One hour after a meal: at or  below 140 mg/dL (7.8 mmol/L).  Two hours after a meal: at or below 120 mg/dL (6.7 mmol/L).  A1c (hemoglobin A1c) level: 6-6.5%. Follow these instructions at home: Questions to Ask Your Health Care Provider  Consider asking the following questions:  Do I need to meet with  a diabetes educator?  Where can I find a support group for people with diabetes?  What equipment will I need to manage my diabetes at home?  What diabetes medicines do I need, and when should I take them?  How often do I need to check my blood glucose?  What number can I call if I have questions?  When is my next appointment? General Instructions  Take over-the-counter and prescription medicines only as told by your health care provider.  Manage your weight gain during pregnancy. The amount of weight that you are expected to gain depends on your pre-pregnancy BMI (body mass index).  Keep all follow-up visits as told by your health care provider. This is important.  For more information about diabetes, visit:  American Diabetes Association (ADA): www.diabetes.org  American Association of Diabetes Educators (AADE): www.diabeteseducator.org/patient-resources Contact a health care provider if:  Your blood glucose level is at or above 240 mg/dL (16.1 mmol/L).  Your blood glucose level is at or above 200 mg/dL (09.6 mmol/L) and you have ketones in your urine.  You have been sick or have had a fever for 2 days or more and you are not getting better.  You have any of the following problems for more than 6 hours:  You cannot eat or drink.  You have nausea and vomiting.  You have diarrhea. Get help right away if:  Your blood glucose is below 54 mg/dL (3 mmol/L).  You become confused or you have trouble thinking clearly.  You have difficulty breathing.  You have moderate or large ketone levels in your urine.  Your baby is moving around less than usual.  You develop unusual discharge or bleeding from  your vagina.  You start having contractions early (prematurely). Contractions may feel like a tightening in your lower abdomen. This information is not intended to replace advice given to you by your health care provider. Make sure you discuss any questions you have with your health care provider. Document Released: 05/23/2000 Document Revised: 07/23/2015 Document Reviewed: 03/20/2015 Elsevier Interactive Patient Education  2017 [employer].  Multiple Pregnancy Having a multiple pregnancy means that a woman is carrying more than one baby at a time. She may be pregnant with twins, triplets, or more. The majority of multiple pregnancies are twins. Naturally conceiving triplets or more (higher-order multiples) is rare. Multiple pregnancies are riskier than single pregnancies. A woman with a multiple pregnancy is more likely to have certain problems during her pregnancy. Therefore, she will need to have more frequent appointments for prenatal care. How does a multiple pregnancy happen? A multiple pregnancy happens when:  The woman's body releases more than one egg at a time, and then each egg gets fertilized by a different sperm.  This is the most common type of multiple pregnancy.  Twins or other multiples produced this way are fraternal. They are no more alike than non-multiple siblings are.  One sperm fertilizes one egg, which then divides into more than one embryo.  Twins or other multiples produced this way are identical. Identical multiples are always the same gender, and they look very much alike. Who is most likely to have a multiple pregnancy? A multiple pregnancy is more likely to develop in women who:  Have had fertility treatment, especially if the treatment included fertility drugs.  Are older than 31 years of age.  Have already had four or more children.  Have a family history of multiple pregnancy. How is a multiple pregnancy diagnosed? A multiple  pregnancy may be  diagnosed based on:  Symptoms such as:  Rapid weight gain in the first 3 months of pregnancy (first trimester).  More severe nausea and breast tenderness than what is typical of a single pregnancy.  The uterus measuring larger than what is normal for the stage of the pregnancy.  Blood tests that detect a higher-than-normal level of human chorionic gonadotropin (hCG). This is a hormone that your body produces in early pregnancy.  Ultrasound exam. This is used to confirm that you are carrying multiples. What risks are associated with multiple pregnancy? A multiple pregnancy puts you at a higher risk for certain problems during or after your pregnancy, including:  Having your babies delivered before you have reached a full-term pregnancy (preterm birth). A full-term pregnancy lasts for at least 37 weeks. Babies born before 37 weeks may have a higher risk of a variety of health problems, such as breathing problems, feeding difficulties, cerebral palsy, and learning disabilities.  Diabetes.  Preeclampsia. This is a serious condition that causes high blood pressure along with other symptoms, such as swelling and headaches, during pregnancy.  Excessive blood loss after childbirth (postpartum hemorrhage).  Postpartum depression.  Low birth weight of the babies. How will having a multiple pregnancy affect my care? Your health care provider will want to monitor you more closely during your pregnancy to make sure that your babies are growing normally and that you are healthy. Follow these instructions at home: Because your pregnancy is considered to be high risk, you will need to work closely with your health care team. You may also need to make some lifestyle changes. These may include the following: Eating and drinking  Increase your nutrition.  Follow your health care provider's recommendations for weight gain. You may need to gain a little extra weight when you are pregnant with  multiples.  Eat healthy snacks often throughout the day. This can add calories and reduce nausea.  Drink enough fluid to keep your urine clear or pale yellow.  Take prenatal vitamins. Activity By 20-24 weeks, you may need to limit your activities.  Avoid activities and work that take a lot of effort (are strenuous).  Ask your health care provider when you should stop having sexual intercourse.  Rest often. General instructions  Do not use any products that contain nicotine or tobacco, such as cigarettes and e-cigarettes. If you need help quitting, ask your health care provider.  Do not drink alcohol or use illegal drugs.  Take over-the-counter and prescription medicines only as told by your health care provider.  Arrange for extra help around the house.  Keep all follow-up visits and all prenatal visits as told by your health care provider. This is important. Contact a health care provider if:  You have dizziness.  You have persistent nausea, vomiting, or diarrhea.  You are having trouble gaining weight.  You have feelings of depression or other emotions that are interfering with your normal activities. Get help right away if:  You have a fever.  You have pain with urination.  You have fluid leaking from your vagina.  You have a bad-smelling vaginal discharge.  You notice increased swelling in your face, hands, legs, or ankles.  You have spotting or bleeding from your vagina.  You have pelvic cramps, pelvic pressure, or nagging pain in your abdomen or lower back.  You are having regular contractions.  You develop a severe headache, with or without visual changes.  You have shortness of breath or  chest pain.  You notice less fetal movement, or no fetal movement. This information is not intended to replace advice given to you by your health care provider. Make sure you discuss any questions you have with your health care provider. Document Released: 11/24/2007  Document Revised: 10/16/2015 Document Reviewed: 10/16/2015 Elsevier Interactive Patient Education  2017 [employer].   Breastfeeding Deciding to breastfeed is one of the best choices you can make for you and your baby. A change in hormones during pregnancy causes your breast tissue to grow and increases the number and size of your milk ducts. These hormones also allow proteins, sugars, and fats from your blood supply to make breast milk in your milk-producing glands. Hormones prevent breast milk from being released before your baby is born as well as prompt milk flow after birth. Once breastfeeding has begun, thoughts of your baby, as well as his or her sucking or crying, can stimulate the release of milk from your milk-producing glands. Benefits of breastfeeding For Your Baby  Your first milk (colostrum) helps your baby's digestive system function better.  There are antibodies in your milk that help your baby fight off infections.  Your baby has a lower incidence of asthma, allergies, and sudden infant death syndrome.  The nutrients in breast milk are better for your baby than infant formulas and are designed uniquely for your baby's needs.  Breast milk improves your baby's brain development.  Your baby is less likely to develop other conditions, such as childhood obesity, asthma, or type 2 diabetes mellitus. For You  Breastfeeding helps to create a very special bond between you and your baby.  Breastfeeding is convenient. Breast milk is always available at the correct temperature and costs nothing.  Breastfeeding helps to burn calories and helps you lose the weight gained during pregnancy.  Breastfeeding makes your uterus contract to its prepregnancy size faster and slows bleeding (lochia) after you give birth.  Breastfeeding helps to lower your risk of developing type 2 diabetes mellitus, osteoporosis, and breast or ovarian cancer later in life. Signs that your baby is  hungry Early Signs of Hunger  Increased alertness or activity.  Stretching.  Movement of the head from side to side.  Movement of the head and opening of the mouth when the corner of the mouth or cheek is stroked (rooting).  Increased sucking sounds, smacking lips, cooing, sighing, or squeaking.  Hand-to-mouth movements.  Increased sucking of fingers or hands. Late Signs of Hunger  Fussing.  Intermittent crying. Extreme Signs of Hunger  Signs of extreme hunger will require calming and consoling before your baby will be able to breastfeed successfully. Do not wait for the following signs of extreme hunger to occur before you initiate breastfeeding:  Restlessness.  A loud, strong cry.  Screaming. Breastfeeding basics  Breastfeeding Initiation  Find a comfortable place to sit or lie down, with your neck and back well supported.  Place a pillow or rolled up blanket under your baby to bring him or her to the level of your breast (if you are seated). Nursing pillows are specially designed to help support your arms and your baby while you breastfeed.  Make sure that your baby's abdomen is facing your abdomen.  Gently massage your breast. With your fingertips, massage from your chest wall toward your nipple in a circular motion. This encourages milk flow. You may need to continue this action during the feeding if your milk flows slowly.  Support your breast with 4 fingers underneath  and your thumb above your nipple. Make sure your fingers are well away from your nipple and your baby's mouth.  Stroke your baby's lips gently with your finger or nipple.  When your baby's mouth is open wide enough, quickly bring your baby to your breast, placing your entire nipple and as much of the colored area around your nipple (areola) as possible into your baby's mouth.  More areola should be visible above your baby's upper lip than below the lower lip.  Your baby's tongue should be between  his or her lower gum and your breast.  Ensure that your baby's mouth is correctly positioned around your nipple (latched). Your baby's lips should create a seal on your breast and be turned out (everted).  It is common for your baby to suck about 2-3 minutes in order to start the flow of breast milk. Latching  Teaching your baby how to latch on to your breast properly is very important. An improper latch can cause nipple pain and decreased milk supply for you and poor weight gain in your baby. Also, if your baby is not latched onto your nipple properly, he or she may swallow some air during feeding. This can make your baby fussy. Burping your baby when you switch breasts during the feeding can help to get rid of the air. However, teaching your baby to latch on properly is still the best way to prevent fussiness from swallowing air while breastfeeding. Signs that your baby has successfully latched on to your nipple:  Silent tugging or silent sucking, without causing you pain.  Swallowing heard between every 3-4 sucks.  Muscle movement above and in front of his or her ears while sucking. Signs that your baby has not successfully latched on to nipple:  Sucking sounds or smacking sounds from your baby while breastfeeding.  Nipple pain. If you think your baby has not latched on correctly, slip your finger into the corner of your baby's mouth to break the suction and place it between your baby's gums. Attempt breastfeeding initiation again. Signs of Successful Breastfeeding  Signs from your baby:  A gradual decrease in the number of sucks or complete cessation of sucking.  Falling asleep.  Relaxation of his or her body.  Retention of a small amount of milk in his or her mouth.  Letting go of your breast by himself or herself. Signs from you:  Breasts that have increased in firmness, weight, and size 1-3 hours after feeding.  Breasts that are softer immediately after  breastfeeding.  Increased milk volume, as well as a change in milk consistency and color by the fifth day of breastfeeding.  Nipples that are not sore, cracked, or bleeding. Signs That Your Pecola Leisure is Getting Enough Milk  Wetting at least 1-2 diapers during the first 24 hours after birth.  Wetting at least 5-6 diapers every 24 hours for the first week after birth. The urine should be clear or pale yellow by 5 days after birth.  Wetting 6-8 diapers every 24 hours as your baby continues to grow and develop.  At least 3 stools in a 24-hour period by age 526 days. The stool should be soft and yellow.  At least 3 stools in a 24-hour period by age 93 days. The stool should be seedy and yellow.  No loss of weight greater than 10% of birth weight during the first 80 days of age.  Average weight gain of 4-7 ounces (113-198 g) per week after age 52 days.  Consistent daily weight gain by age 87 days, without weight loss after the age of 2 weeks. After a feeding, your baby may spit up a small amount. This is common. Breastfeeding frequency and duration Frequent feeding will help you make more milk and can prevent sore nipples and breast engorgement. Breastfeed when you feel the need to reduce the fullness of your breasts or when your baby shows signs of hunger. This is called "breastfeeding on demand." Avoid introducing a pacifier to your baby while you are working to establish breastfeeding (the first 4-6 weeks after your baby is born). After this time you may choose to use a pacifier. Research has shown that pacifier use during the first year of a baby's life decreases the risk of sudden infant death syndrome (SIDS). Allow your baby to feed on each breast as long as he or she wants. Breastfeed until your baby is finished feeding. When your baby unlatches or falls asleep while feeding from the first breast, offer the second breast. Because newborns are often sleepy in the first few weeks of life, you may need  to awaken your baby to get him or her to feed. Breastfeeding times will vary from baby to baby. However, the following rules can serve as a guide to help you ensure that your baby is properly fed:  Newborns (babies 21 weeks of age or younger) may breastfeed every 1-3 hours.  Newborns should not go longer than 3 hours during the day or 5 hours during the night without breastfeeding.  You should breastfeed your baby a minimum of 8 times in a 24-hour period until you begin to introduce solid foods to your baby at around 59 months of age. Breast milk pumping Pumping and storing breast milk allows you to ensure that your baby is exclusively fed your breast milk, even at times when you are unable to breastfeed. This is especially important if you are going back to work while you are still breastfeeding or when you are not able to be present during feedings. Your lactation consultant can give you guidelines on how long it is safe to store breast milk. A breast pump is a machine that allows you to pump milk from your breast into a sterile bottle. The pumped breast milk can then be stored in a refrigerator or freezer. Some breast pumps are operated by hand, while others use electricity. Ask your lactation consultant which type will work best for you. Breast pumps can be purchased, but some hospitals and breastfeeding support groups lease breast pumps on a monthly basis. A lactation consultant can teach you how to hand express breast milk, if you prefer not to use a pump. Caring for your breasts while you breastfeed Nipples can become dry, cracked, and sore while breastfeeding. The following recommendations can help keep your breasts moisturized and healthy:  Avoid using soap on your nipples.  Wear a supportive bra. Although not required, special nursing bras and tank tops are designed to allow access to your breasts for breastfeeding without taking off your entire bra or top. Avoid wearing underwire-style bras  or extremely tight bras.  Air dry your nipples for 3-61minutes after each feeding.  Use only cotton bra pads to absorb leaked breast milk. Leaking of breast milk between feedings is normal.  Use lanolin on your nipples after breastfeeding. Lanolin helps to maintain your skin's normal moisture barrier. If you use pure lanolin, you do not need to wash it off before feeding your baby again. Pure lanolin  is not toxic to your baby. You may also hand express a few drops of breast milk and gently massage that milk into your nipples and allow the milk to air dry. In the first few weeks after giving birth, some women experience extremely full breasts (engorgement). Engorgement can make your breasts feel heavy, warm, and tender to the touch. Engorgement peaks within 3-5 days after you give birth. The following recommendations can help ease engorgement:  Completely empty your breasts while breastfeeding or pumping. You may want to start by applying warm, moist heat (in the shower or with warm water-soaked hand towels) just before feeding or pumping. This increases circulation and helps the milk flow. If your baby does not completely empty your breasts while breastfeeding, pump any extra milk after he or she is finished.  Wear a snug bra (nursing or regular) or tank top for 1-2 days to signal your body to slightly decrease milk production.  Apply ice packs to your breasts, unless this is too uncomfortable for you.  Make sure that your baby is latched on and positioned properly while breastfeeding. If engorgement persists after 48 hours of following these recommendations, contact your health care provider or a Advertising copywriter. Overall health care recommendations while breastfeeding  Eat healthy foods. Alternate between meals and snacks, eating 3 of each per day. Because what you eat affects your breast milk, some of the foods may make your baby more irritable than usual. Avoid eating these foods if you  are sure that they are negatively affecting your baby.  Drink milk, fruit juice, and water to satisfy your thirst (about 10 glasses a day).  Rest often, relax, and continue to take your prenatal vitamins to prevent fatigue, stress, and anemia.  Continue breast self-awareness checks.  Avoid chewing and smoking tobacco. Chemicals from cigarettes that pass into breast milk and exposure to secondhand smoke may harm your baby.  Avoid alcohol and drug use, including marijuana. Some medicines that may be harmful to your baby can pass through breast milk. It is important to ask your health care provider before taking any medicine, including all over-the-counter and prescription medicine as well as vitamin and herbal supplements. It is possible to become pregnant while breastfeeding. If birth control is desired, ask your health care provider about options that will be safe for your baby. Contact a health care provider if:  You feel like you want to stop breastfeeding or have become frustrated with breastfeeding.  You have painful breasts or nipples.  Your nipples are cracked or bleeding.  Your breasts are red, tender, or warm.  You have a swollen area on either breast.  You have a fever or chills.  You have nausea or vomiting.  You have drainage other than breast milk from your nipples.  Your breasts do not become full before feedings by the fifth day after you give birth.  You feel sad and depressed.  Your baby is too sleepy to eat well.  Your baby is having trouble sleeping.  Your baby is wetting less than 3 diapers in a 24-hour period.  Your baby has less than 3 stools in a 24-hour period.  Your baby's skin or the white part of his or her eyes becomes yellow.  Your baby is not gaining weight by 93 days of age. Get help right away if:  Your baby is overly tired (lethargic) and does not want to wake up and feed.  Your baby develops an unexplained fever. This information is  not intended to replace advice given to you by your health care provider. Make sure you discuss any questions you have with your health care provider. Document Released: 02/14/2005 Document Revised: 07/29/2015 Document Reviewed: 08/08/2012 Elsevier Interactive Patient Education  2017 [employer]

## 2016-02-17 NOTE — Progress Notes (Signed)
  Subjective:    Kathy Sandoval is a [id number] 48w6dbeing seen today for her first obstetrical visit.  Her obstetrical history is significant for type 2 DM, obesity. Patient does intend to breast feed. Pregnancy history fully reviewed.  Patient reports no complaints.  Vitals:   02/17/16 1317  BP: 131/88  Pulse: 97  Weight: 251 lb (113.9 kg)    HISTORY: OB History  Gravida Para Term Preterm AB Living  _0 0 0 2  SAB TAB Ectopic Multiple Live Births  0 0 0 0 2    # Outcome Date GA Lbr Len/2nd Weight Sex Delivery Anes PTL Lv  3 Current           2 Term 01/19/11 [id number]:22 / 00:06 8 lb 7.6 oz (3.845 kg) F Vag-Spont None  LIV     Birth Comments: None  1 Term 05/13/02 395w6d lb 4 oz (2.835 kg) F Vag-Spont  N LIV     Past Medical History:  Diagnosis Date  . Anemia   . Diabetes in pregnancy 11/08/2010   Past Surgical History:  Procedure Laterality Date  . NO PAST SURGERIES     Family History  Problem Relation Age of Onset  . Thyroid disease Mother      Exam    Uterus:   12-weeks size  Pelvic Exam:    Perineum: No Hemorrhoids, Normal Perineum   Vulva: normal   Vagina:  normal mucosa, normal discharge   pH:    Cervix: multiparous appearance and closed and long   Adnexa: normal adnexa and no mass, fullness, tenderness   Bony Pelvis: gynecoid  System: Breast:  normal appearance, no masses or tenderness   Skin: normal coloration and turgor, no rashes    Neurologic: oriented, no focal deficits   Extremities: normal strength, tone, and muscle mass   HEENT extra ocular movement intact   Mouth/Teeth mucous membranes moist, pharynx normal without lesions and dental hygiene good   Neck supple and no masses   Cardiovascular: regular rate and rhythm   Respiratory:  chest clear, no wheezing, crepitations, rhonchi, normal symmetric air entry   Abdomen: soft, non-tender; bowel sounds normal; no masses,  no organomegaly   Urinary:       Assessment:    Pregnancy:  [id number] Active Problem List   Diagnosis Date Noted  . Supervision of high risk pregnancy in first trimester 02/17/2016  . Dichorionic diamniotic twin pregnancy, antepartum 02/17/2016  . Type 2 diabetes mellitus in pregnancy 02/08/2016  . Obesity 04/12/2010  . MIGRAINE, UNSPEC., W/O INTRACTABLE MIGRAINE 04/27/2006        Plan:     Initial labs drawn. Prenatal vitamins. Problem list reviewed and updated. Genetic Screening discussed First Screen: ordered.  Ultrasound discussed; fetal survey: requested. Patient has been checking CBGS and met with diabetic educator. She is currently controlled with metformin [id number] and reports an improvement in her values Fasting 99-115 and 2h pp 136-145. Will start glyburide 2.5 mg BID Discussed starting ASA 81 mg at 12 weeks Baseline labs ordered Bedside ultrasound demonstrated Di-Di twin IUP measuring approximately 10 weeks.  Follow up in 2 weeks to evaluate CBG on glyburide 50% of 30 min visit spent on counseling and coordination of care.     Chadrick Sprinkle 02/17/2016

## 2016-02-17 NOTE — Telephone Encounter (Signed)
Scheduled nuchal translucency for 1/11 @ 930 & anatomy scan 2/20 @ 930am. Called patient, no answer- left message stating we are trying to reach you regarding ultrasound appts, please call us back

## 2016-02-18 LAB — PRENATAL PROFILE (SOLSTAS)
Antibody Screen: NEGATIVE
BASOS PCT: 0 %
Basophils Absolute: 0 cells/uL (ref 0–200)
Eosinophils Absolute: 532 cells/uL — ABNORMAL HIGH (ref 15–500)
Eosinophils Relative: 4 %
HEMATOCRIT: 40.9 % (ref 35.0–45.0)
HIV: NONREACTIVE
Hemoglobin: 13.7 g/dL (ref 11.7–15.5)
Hepatitis B Surface Ag: NEGATIVE
Lymphocytes Relative: 26 %
Lymphs Abs: 3458 cells/uL (ref 850–3900)
MCH: 29.4 pg (ref 27.0–33.0)
MCHC: 33.5 g/dL (ref 32.0–36.0)
MCV: 87.8 fL (ref 80.0–100.0)
MPV: 11.5 fL (ref 7.5–12.5)
Monocytes Absolute: 798 cells/uL (ref 200–950)
Monocytes Relative: 6 %
NEUTROS PCT: 64 %
Neutro Abs: 8512 cells/uL — ABNORMAL HIGH (ref 1500–7800)
Platelets: 318 10*3/uL (ref 140–400)
RBC: 4.66 MIL/uL (ref 3.80–5.10)
RDW: 14 % (ref 11.0–15.0)
Rh Type: POSITIVE
Rubella: <0.9 Index (ref <0.90)
WBC: 13.3 10*3/uL — AB (ref 3.8–10.8)

## 2016-02-18 LAB — PAIN MGMT, PROFILE 6 CONF W/O MM, U
6 Acetylmorphine: NEGATIVE ng/mL (ref <10)
Alcohol Metabolites: NEGATIVE ng/mL (ref <500)
Amphetamines: NEGATIVE ng/mL (ref <500)
Barbiturates: NEGATIVE ng/mL (ref <300)
Benzodiazepines: NEGATIVE ng/mL (ref <100)
COCAINE METABOLITE: NEGATIVE ng/mL (ref <150)
CREATININE: 141.8 mg/dL (ref >=20.0)
MARIJUANA METABOLITE: NEGATIVE ng/mL (ref <20)
Methadone Metabolite: NEGATIVE ng/mL (ref <100)
OXYCODONE: NEGATIVE ng/mL (ref <100)
Opiates: NEGATIVE ng/mL (ref <100)
Oxidant: NEGATIVE ug/mL (ref <200)
PLEASE NOTE: 0
Phencyclidine: NEGATIVE ng/mL (ref <25)
pH: 6.4 (ref 4.5–9.0)

## 2016-02-18 LAB — CULTURE, OB URINE

## 2016-02-18 LAB — PROTEIN / CREATININE RATIO, URINE
Creatinine, Urine: 152 mg/dL (ref 20–320)
PROTEIN CREATININE RATIO: 99 mg/g{creat} (ref 21–161)
Total Protein, Urine: 15 mg/dL (ref 5–24)

## 2016-02-18 LAB — HEMOGLOBIN A1C
Hgb A1c MFr Bld: 7.4 % — ABNORMAL HIGH (ref <5.7)
Mean Plasma Glucose: 166 mg/dL

## 2016-02-18 LAB — GC/CHLAMYDIA PROBE AMP (~~LOC~~) NOT AT ARMC
CHLAMYDIA, DNA PROBE: NEGATIVE
Neisseria Gonorrhea: NEGATIVE

## 2016-02-19 ENCOUNTER — Encounter: Payer: Self-pay | Admitting: Obstetrics and Gynecology

## 2016-02-19 ENCOUNTER — Telehealth: Payer: Self-pay | Admitting: General Practice

## 2016-02-19 DIAGNOSIS — O09899 Supervision of other high risk pregnancies, unspecified trimester: Secondary | ICD-10-CM | POA: Insufficient documentation

## 2016-02-19 DIAGNOSIS — O24111 Pre-existing diabetes mellitus, type 2, in pregnancy, first trimester: Secondary | ICD-10-CM

## 2016-02-19 DIAGNOSIS — O9989 Other specified diseases and conditions complicating pregnancy, childbirth and the puerperium: Secondary | ICD-10-CM

## 2016-02-19 DIAGNOSIS — Z283 Underimmunization status: Secondary | ICD-10-CM | POA: Insufficient documentation

## 2016-02-19 MED ORDER — PRENATAL VITAMINS 0.8 MG PO TABS: 1.0000 | ORAL_TABLET | Freq: Every day | ORAL | 12 refills | Status: DC

## 2016-02-19 NOTE — Telephone Encounter (Signed)
Patient called and left message stating she only got one prescription from her pharmacy and was supposed to get one for prenatal vitamins. Called patient and informed her of refill. Patient verbalized understanding & asked about her test results. Informed patient. Patient verbalized understanding & had no questions

## 2016-02-24 NOTE — Telephone Encounter (Signed)
Called pt and informed her of appts scheduled on 1/11 for nuchal translucency and 2/20 for anatomy ultrasound.  Pt voiced understanding.

## 2016-02-25 ENCOUNTER — Encounter (HOSPITAL_COMMUNITY): Payer: Self-pay | Admitting: Obstetrics and Gynecology

## 2016-03-03 ENCOUNTER — Ambulatory Visit (INDEPENDENT_AMBULATORY_CARE_PROVIDER_SITE_OTHER): Payer: Medicaid Other | Admitting: Family

## 2016-03-03 ENCOUNTER — Ambulatory Visit: Payer: Self-pay

## 2016-03-03 VITALS — BP 122/75 | HR 101 | Wt 251.2 lb

## 2016-03-03 DIAGNOSIS — O24111 Pre-existing diabetes mellitus, type 2, in pregnancy, first trimester: Secondary | ICD-10-CM

## 2016-03-03 DIAGNOSIS — O3680X1 Pregnancy with inconclusive fetal viability, fetus 1: Secondary | ICD-10-CM | POA: Diagnosis not present

## 2016-03-03 DIAGNOSIS — Z3689 Encounter for other specified antenatal screening: Secondary | ICD-10-CM | POA: Diagnosis not present

## 2016-03-03 DIAGNOSIS — O3680X2 Pregnancy with inconclusive fetal viability, fetus 2: Secondary | ICD-10-CM | POA: Diagnosis not present

## 2016-03-03 DIAGNOSIS — O30041 Twin pregnancy, dichorionic/diamniotic, first trimester: Secondary | ICD-10-CM

## 2016-03-03 DIAGNOSIS — O0991 Supervision of high risk pregnancy, unspecified, first trimester: Secondary | ICD-10-CM

## 2016-03-03 DIAGNOSIS — O30049 Twin pregnancy, dichorionic/diamniotic, unspecified trimester: Secondary | ICD-10-CM

## 2016-03-03 NOTE — Progress Notes (Signed)
   PRENATAL VISIT NOTE  Subjective:  Kathy Sandoval is a 31 y.o. G3P2002 at 8442w0d being seen today for ongoing prenatal care.  She is currently monitored for the following issues for this high-risk pregnancy and has MIGRAINE, UNSPEC., W/O INTRACTABLE MIGRAINE; Obesity; Type 2 diabetes mellitus in pregnancy; Supervision of high risk pregnancy in first trimester; Dichorionic diamniotic twin pregnancy, antepartum; GBS bacteriuria; and Rubella non-immune status, antepartum on her problem list.  Patient reports no complaints.  Contractions: Not present. Vag. Bleeding: None.  Movement: Absent. Denies leaking of fluid.   The following portions of the patient's history were reviewed and updated as appropriate: allergies, current medications, past family history, past medical history, past social history, past surgical history and problem list. Problem list updated.  Objective:   Vitals:   03/03/16 0934  BP: 122/75  Pulse: (!) 101  Weight: 251 lb 3.2 oz (113.9 kg)    Fetal Status:     Movement: Absent     General:  Alert, oriented and cooperative. Patient is in no acute distress.  Skin: Skin is warm and dry. No rash noted.   Cardiovascular: Normal heart rate noted  Respiratory: Normal respiratory effort, no problems with respiration noted  Abdomen: Soft, gravid, appropriate for gestational age. Pain/Pressure: Present     Pelvic:  Cervical exam deferred        Extremities: Normal range of motion.  Edema: None  Mental Status: Normal mood and affect. Normal behavior. Normal judgment and thought content.   Assessment and Plan:  Pregnancy: G3P2002 at 3642w0d  1. Pregnancy with type 2 diabetes mellitus in first trimester - Did not bring log; reports all values wnl - Plans to begin ASA 81 next week  2. Supervision of high risk pregnancy in first trimester - NT scheduled for next week - Reviewed new OB labs - Flu vaccine  3. Dichorionic diamniotic twin pregnancy, antepartum - Limited US  today for FHTs > +x2 via limited ultrasound  General obstetric precautions including but not limited to vaginal bleeding and pelvic pain reviewed in detail with the patient. Please refer to After Visit Summary for other counseling recommendations.  Return in about 3 weeks (around 03/24/2016).   Eino FarberWalidah Kennith GainN Karim, CNM

## 2016-03-03 NOTE — Progress Notes (Addendum)
Pt informed that the ultrasound is considered a limited OB ultrasound and is not intended to be a complete ultrasound exam.  Patient also informed that the ultrasound is not being completed with the intent of assessing for fetal or placental anomalies or any pelvic abnormalities.  Explained that the purpose of today's ultrasound is to assess for viability.  Patient acknowledges the purpose of the exam and the limitations of the study.   Twin IUP, Di/Di FHR twin A = 170 bpm per PW doppler; FM present FHR twin B = 162 bpm per PW doppler; FM present

## 2016-03-03 NOTE — Patient Instructions (Signed)
Baby Aspirin 81 mg

## 2016-03-06 ENCOUNTER — Encounter (HOSPITAL_COMMUNITY): Payer: Self-pay | Admitting: *Deleted

## 2016-03-06 ENCOUNTER — Inpatient Hospital Stay (HOSPITAL_COMMUNITY)
Admission: AD | Admit: 2016-03-06 | Discharge: 2016-03-06 | Disposition: A | Discharge status: Home / Self Care | Payer: Medicaid Other | Source: Ambulatory Visit | Attending: Obstetrics & Gynecology | Admitting: Obstetrics & Gynecology

## 2016-03-06 DIAGNOSIS — Z789 Other specified health status: Secondary | ICD-10-CM | POA: Insufficient documentation

## 2016-03-06 DIAGNOSIS — O209 Hemorrhage in early pregnancy, unspecified: Secondary | ICD-10-CM | POA: Diagnosis not present

## 2016-03-06 DIAGNOSIS — Z7984 Long term (current) use of oral hypoglycemic drugs: Secondary | ICD-10-CM | POA: Insufficient documentation

## 2016-03-06 DIAGNOSIS — O30041 Twin pregnancy, dichorionic/diamniotic, first trimester: Secondary | ICD-10-CM | POA: Diagnosis not present

## 2016-03-06 DIAGNOSIS — Z87891 Personal history of nicotine dependence: Secondary | ICD-10-CM | POA: Diagnosis not present

## 2016-03-06 DIAGNOSIS — O288 Other abnormal findings on antenatal screening of mother: Secondary | ICD-10-CM | POA: Insufficient documentation

## 2016-03-06 DIAGNOSIS — O26851 Spotting complicating pregnancy, first trimester: Secondary | ICD-10-CM | POA: Insufficient documentation

## 2016-03-06 DIAGNOSIS — Z3A12 12 weeks gestation of pregnancy: Secondary | ICD-10-CM | POA: Diagnosis present

## 2016-03-06 DIAGNOSIS — O24911 Unspecified diabetes mellitus in pregnancy, first trimester: Secondary | ICD-10-CM | POA: Insufficient documentation

## 2016-03-06 DIAGNOSIS — Z79899 Other long term (current) drug therapy: Secondary | ICD-10-CM | POA: Insufficient documentation

## 2016-03-06 DIAGNOSIS — O30049 Twin pregnancy, dichorionic/diamniotic, unspecified trimester: Secondary | ICD-10-CM

## 2016-03-06 LAB — URINALYSIS, ROUTINE W REFLEX MICROSCOPIC
BACTERIA UA: NONE SEEN
Bilirubin Urine: NEGATIVE
GLUCOSE, UA: 50 mg/dL — AB
Ketones, ur: NEGATIVE mg/dL
Leukocytes, UA: NEGATIVE
NITRITE: NEGATIVE
Protein, ur: NEGATIVE mg/dL
SPECIFIC GRAVITY, URINE: 1.008 (ref 1.005–1.030)
pH: 6 (ref 5.0–8.0)

## 2016-03-06 NOTE — MAU Provider Note (Signed)
Chief Complaint: Vaginal Bleeding   First Provider Initiated Contact with Patient 03/06/16 2003        SUBJECTIVE HPI: Kathy Sandoval is a 31 y.o. G3P2002 at 2020w3d by LMP who presents to maternity admissions reporting vaginal bleeding which is pink twice today.  No cramping or pain.   Was seen a few days ago for clinic visit and US showed twins are fine. .No recent IC. She denies vaginal itching/burning, urinary symptoms, h/a, dizziness, n/v, or fever/chills.     Vaginal Bleeding  The patient's primary symptoms include vaginal bleeding. The patient's pertinent negatives include no genital itching, genital lesions, genital odor or pelvic pain. This is a new problem. The current episode started today. The problem occurs intermittently. The patient is experiencing no pain. She is pregnant. Pertinent negatives include no abdominal pain, back pain, chills, constipation, diarrhea, dysuria, fever, headaches, nausea or vomiting. The vaginal discharge was bloody. The vaginal bleeding is spotting. She has not been passing clots. She has not been passing tissue. Nothing aggravates the symptoms. She has tried nothing for the symptoms. She is sexually active.    Past Medical History:  Diagnosis Date  . Anemia   . Diabetes in pregnancy 11/08/2010   Past Surgical History:  Procedure Laterality Date  . NO PAST SURGERIES     Social History   Social History  . Marital status: Married    Spouse name: N/A  . Number of children: N/A  . Years of education: N/A   Occupational History  . Not on file.   Social History Main Topics  . Smoking status: Former Smoker    Types: Cigarettes    Quit date: 06/29/2011  . Smokeless tobacco: Never Used  . Alcohol use 1.2 oz/week    2 Standard drinks or equivalent per week     Comment: prior to preg  . Drug use: No  . Sexual activity: Yes    Partners: Male    Birth control/ protection: Other-see comments     Comment: last sex was February 29 2016.   Other  Topics Concern  . Not on file   Social History Narrative  . No narrative on file   No current facility-administered medications on file prior to encounter.    Current Outpatient Prescriptions on File Prior to Encounter  Medication Sig Dispense Refill  . glucose blood (ACCU-CHEK AVIVA PLUS) test strip Use as instructed 100 each 12  . glyBURIDE (DIABETA) 2.5 MG tablet Take 1 tablet (2.5 mg total) by mouth 2 (two) times daily with a meal. 60 tablet 3  . Lancet Devices (ACCU-CHEK SOFTCLIX) lancets Use as instructed 1 each 12  . metFORMIN (GLUCOPHAGE) 500 MG tablet Take 1 tablet (500 mg total) by mouth 2 (two) times daily with a meal. 60 tablet 11  . Prenatal Multivit-Min-Fe-FA (PRENATAL VITAMINS) 0.8 MG tablet Take 1 tablet by mouth daily. 30 tablet 12   No Known Allergies  I have reviewed patient's Past Medical Hx, Surgical Hx, Family Hx, Social Hx, medications and allergies.   ROS:  Review of Systems  Constitutional: Negative for chills and fever.  Gastrointestinal: Negative for abdominal pain, constipation, diarrhea, nausea and vomiting.  Genitourinary: Positive for vaginal bleeding. Negative for dysuria and pelvic pain.  Musculoskeletal: Negative for back pain.  Neurological: Negative for headaches.   Review of Systems  Other systems negative   Physical Exam  Physical Exam Patient Vitals for the past 24 hrs:  BP Temp Temp src Pulse Resp Height Weight  03/06/16  1940 120/78 98.2 F (36.8 C) Oral 107 16 5\' 5"  (1.651 m) 251 lb (113.9 kg)   Constitutional: Well-developed, well-nourished female in no acute distress.  Cardiovascular: normal rate Respiratory: normal effort GI: Abd soft, non-tender. Pos BS x 4 MS: Extremities nontender, no edema, normal ROM Neurologic: Alert and oriented x 4.  GU: Neg CVAT.  PELVIC EXAM: Uterus enlarged, c/w 12 week twins,  Difficult exam due to habitus.                           Small amount of mucous/blood in vault                            Cervix closed and long  Bedside US done showed both twins with FHR s 150s-160s.   No obvious source of bleeding, large placental area anteriorly looks normal, though detailed [place] of this was not done.  LAB RESULTS Results for orders placed or performed during the hospital encounter of 03/06/16 (from the past 24 hour(s))  Urinalysis, Routine w reflex microscopic     Status: Abnormal   Collection Time: 03/06/16  7:30 PM  Result Value Ref Range   Color, Urine STRAW (A) YELLOW   APPearance CLEAR CLEAR   Specific Gravity, Urine 1.008 1.005 - 1.030   pH 6.0 5.0 - 8.0   Glucose, UA 50 (A) NEGATIVE mg/dL   Hgb urine dipstick MODERATE (A) NEGATIVE   Bilirubin Urine NEGATIVE NEGATIVE   Ketones, ur NEGATIVE NEGATIVE mg/dL   Protein, ur NEGATIVE NEGATIVE mg/dL   Nitrite NEGATIVE NEGATIVE   Leukocytes, UA NEGATIVE NEGATIVE   RBC / HPF 6-30 0 - 5 RBC/hpf   WBC, UA 0-5 0 - 5 WBC/hpf   Bacteria, UA NONE SEEN NONE SEEN   Squamous Epithelial / LPF 0-5 (A) NONE SEEN   Mucous PRESENT     O/POS/-- (12/20 1411)  IMAGING No results found.  MAU Management/MDM: Unclear as to etiology of bleeding Did not have very recent IC, so doubt post coital Could be [facility], but not appreciated by informal [place] Recommend observation and pt has [place] appt this Thursday  ASSESSMENT 1. GBS bacteriuria   2. Rubella non-immune status, antepartum   3. Dichorionic diamniotic twin pregnancy, antepartum   4.      Bleeding in first/second trimester   PLAN Discharge home Pelvic rest Keep appt Thursday for [place] where they can look at placenta more closely Bleeding precautions  Pt stable at time of discharge. Encouraged to return here or to other Urgent Care/ED if she develops worsening of symptoms, increase in pain, fever, or other concerning symptoms.    Wynelle Bourgeois CNM, MSN Certified Nurse-Midwife 03/06/2016  8:04 PM

## 2016-03-06 NOTE — MAU Note (Signed)
Pt 12 wks and seen in the clinic; noticed pink spotting on the tissue once this evening and upon arrival to MAU.  Denies any pain, discharge, or clots.

## 2016-03-06 NOTE — Discharge Instructions (Signed)
Vaginal Bleeding During Pregnancy, First Trimester °A small amount of bleeding (spotting) from the vagina is common in early pregnancy. Sometimes the bleeding is normal and is not a problem, and sometimes it is a sign of something serious. Be sure to tell your doctor about any bleeding from your vagina right away. °Follow these instructions at home: °· Watch your condition for any changes. °· Follow your doctor's instructions about how active you can be. °· If you are on bed rest: °¨ You may need to stay in bed and only get up to use the bathroom. °¨ You may be allowed to do some activities. °¨ If you need help, make plans for someone to help you. °· Write down: °¨ The number of pads you use each day. °¨ How often you change pads. °¨ How soaked (saturated) your pads are. °· Do not use tampons. °· Do not douche. °· Do not have sex or orgasms until your doctor says it is okay. °· If you pass any tissue from your vagina, save the tissue so you can show it to your doctor. °· Only take medicines as told by your doctor. °· Do not take aspirin because it can make you bleed. °· Keep all follow-up visits as told by your doctor. °Contact a doctor if: °· You bleed from your vagina. °· You have cramps. °· You have labor pains. °· You have a fever that does not go away after you take medicine. °Get help right away if: °· You have very bad cramps in your back or belly (abdomen). °· You pass large clots or tissue from your vagina. °· You bleed more. °· You feel light-headed or weak. °· You pass out (faint). °· You have chills. °· You are leaking fluid or have a gush of fluid from your vagina. °· You pass out while pooping (having a bowel movement). °This information is not intended to replace advice given to you by your health care provider. Make sure you discuss any questions you have with your health care provider. °Document Released: 07/01/2013 Document Revised: 07/23/2015 Document Reviewed: 10/22/2012 °Elsevier Interactive  Patient Education © 2017 Elsevier Inc. ° °

## 2016-03-10 ENCOUNTER — Other Ambulatory Visit: Payer: Self-pay | Admitting: Obstetrics and Gynecology

## 2016-03-10 ENCOUNTER — Ambulatory Visit (HOSPITAL_COMMUNITY)
Admission: RE | Admit: 2016-03-10 | Discharge: 2016-03-10 | Disposition: A | Discharge status: Home / Self Care | Payer: Medicaid Other | Source: Ambulatory Visit | Attending: Obstetrics and Gynecology | Admitting: Obstetrics and Gynecology

## 2016-03-10 ENCOUNTER — Ambulatory Visit (HOSPITAL_COMMUNITY): Admission: RE | Admit: 2016-03-10 | Payer: Medicaid Other | Source: Ambulatory Visit

## 2016-03-10 DIAGNOSIS — O30041 Twin pregnancy, dichorionic/diamniotic, first trimester: Secondary | ICD-10-CM | POA: Diagnosis not present

## 2016-03-10 DIAGNOSIS — E669 Obesity, unspecified: Secondary | ICD-10-CM | POA: Diagnosis not present

## 2016-03-10 DIAGNOSIS — O24111 Pre-existing diabetes mellitus, type 2, in pregnancy, first trimester: Secondary | ICD-10-CM | POA: Insufficient documentation

## 2016-03-10 DIAGNOSIS — O99211 Obesity complicating pregnancy, first trimester: Secondary | ICD-10-CM | POA: Diagnosis not present

## 2016-03-10 DIAGNOSIS — Z3682 Encounter for antenatal screening for nuchal translucency: Secondary | ICD-10-CM

## 2016-03-10 DIAGNOSIS — O0991 Supervision of high risk pregnancy, unspecified, first trimester: Secondary | ICD-10-CM

## 2016-03-10 DIAGNOSIS — Z3A13 13 weeks gestation of pregnancy: Secondary | ICD-10-CM | POA: Diagnosis not present

## 2016-03-11 ENCOUNTER — Other Ambulatory Visit (HOSPITAL_COMMUNITY): Payer: Self-pay | Admitting: *Deleted

## 2016-03-11 DIAGNOSIS — O30042 Twin pregnancy, dichorionic/diamniotic, second trimester: Secondary | ICD-10-CM

## 2016-03-22 ENCOUNTER — Encounter (HOSPITAL_COMMUNITY): Payer: Self-pay

## 2016-03-22 ENCOUNTER — Inpatient Hospital Stay (HOSPITAL_COMMUNITY)
Admission: AD | Admit: 2016-03-22 | Discharge: 2016-03-22 | Disposition: A | Discharge status: Home / Self Care | Payer: Medicaid Other | Source: Ambulatory Visit | Attending: Family Medicine | Admitting: Family Medicine

## 2016-03-22 DIAGNOSIS — O09899 Supervision of other high risk pregnancies, unspecified trimester: Secondary | ICD-10-CM

## 2016-03-22 DIAGNOSIS — O24912 Unspecified diabetes mellitus in pregnancy, second trimester: Secondary | ICD-10-CM | POA: Insufficient documentation

## 2016-03-22 DIAGNOSIS — Z7984 Long term (current) use of oral hypoglycemic drugs: Secondary | ICD-10-CM | POA: Diagnosis not present

## 2016-03-22 DIAGNOSIS — O4692 Antepartum hemorrhage, unspecified, second trimester: Secondary | ICD-10-CM | POA: Diagnosis not present

## 2016-03-22 DIAGNOSIS — Z3A14 14 weeks gestation of pregnancy: Secondary | ICD-10-CM | POA: Diagnosis present

## 2016-03-22 DIAGNOSIS — Z6790 Unspecified blood type, Rh positive: Secondary | ICD-10-CM

## 2016-03-22 DIAGNOSIS — Z87891 Personal history of nicotine dependence: Secondary | ICD-10-CM | POA: Diagnosis not present

## 2016-03-22 DIAGNOSIS — O30042 Twin pregnancy, dichorionic/diamniotic, second trimester: Secondary | ICD-10-CM

## 2016-03-22 DIAGNOSIS — R8271 Bacteriuria: Secondary | ICD-10-CM

## 2016-03-22 DIAGNOSIS — Z79899 Other long term (current) drug therapy: Secondary | ICD-10-CM | POA: Diagnosis not present

## 2016-03-22 DIAGNOSIS — O30049 Twin pregnancy, dichorionic/diamniotic, unspecified trimester: Secondary | ICD-10-CM

## 2016-03-22 DIAGNOSIS — Z283 Underimmunization status: Secondary | ICD-10-CM

## 2016-03-22 DIAGNOSIS — O24112 Pre-existing diabetes mellitus, type 2, in pregnancy, second trimester: Secondary | ICD-10-CM

## 2016-03-22 DIAGNOSIS — O9989 Other specified diseases and conditions complicating pregnancy, childbirth and the puerperium: Secondary | ICD-10-CM

## 2016-03-22 LAB — WET PREP, GENITAL
CLUE CELLS WET PREP: NONE SEEN
SPERM: NONE SEEN
Trich, Wet Prep: NONE SEEN
Yeast Wet Prep HPF POC: NONE SEEN

## 2016-03-22 NOTE — MAU Note (Signed)
Pt reports vaginal bleeding off/on for 2 weeks, was seen here when the bleeding started and u/s was WNL. Denies abd pain but states her abd feels like it gets hard.

## 2016-03-22 NOTE — MAU Provider Note (Signed)
History     CSN: [id number]  Arrival date and time: 03/22/16 2013   First Provider Initiated Contact with Patient 03/22/16 2147      No chief complaint on file.  [id number] @14 .5 didi twins here with VB and passed a clot. She reports intermittent bleeding and spotting since last MAU visit on 03/08/15. Today she only saw dark red on the toilet paper, she has not needed pads or liners. She then passed a moderate sized clot in the toilet around 7 pm. No recent IC or anything in the vagina. She denies pain. Review of records reveals pregnancy has been complicated by didi twins, T2DM, and bleeding in pregnancy w/o identified cause.     OB History    Gravida Para Term Preterm AB Living   3 2 2  0 0 2   SAB TAB Ectopic Multiple Live Births   0 0 0 0 2      Past Medical History:  Diagnosis Date  . Anemia   . Diabetes in pregnancy 11/08/2010    Past Surgical History:  Procedure Laterality Date  . NO PAST SURGERIES      Family History  Problem Relation Age of Onset  . Thyroid disease Mother     Social History  Substance Use Topics  . Smoking status: Former Smoker    Types: Cigarettes    Quit date: 06/29/2011  . Smokeless tobacco: Never Used  . Alcohol use 1.2 oz/week    2 Standard drinks or equivalent per week     Comment: prior to preg    Allergies: No Known Allergies  Prescriptions Prior to Admission  Medication Sig Dispense Refill Last Dose  . glucose blood (ACCU-CHEK AVIVA PLUS) test strip Use as instructed 100 each 12 Taking  . glyBURIDE (DIABETA) 2.5 MG tablet Take 1 tablet (2.5 mg total) by mouth 2 (two) times daily with a meal. 60 tablet 3 Taking  . Lancet Devices (ACCU-CHEK SOFTCLIX) lancets Use as instructed 1 each 12 Taking  . metFORMIN (GLUCOPHAGE) 500 MG tablet Take 1 tablet (500 mg total) by mouth 2 (two) times daily with a meal. 60 tablet 11 Taking  . Prenatal Multivit-Min-Fe-FA (PRENATAL VITAMINS) 0.8 MG tablet Take 1 tablet by mouth daily. 30 tablet 12 Taking     Review of Systems  Constitutional: Negative for fever.  Gastrointestinal: Negative for abdominal pain.  Genitourinary: Positive for vaginal bleeding. Negative for vaginal discharge.   Physical Exam   Blood pressure 145/65, pulse 109, temperature 98.7 F (37.1 C), temperature source Oral, resp. rate 18, height 5\' 5"  (1.651 m), weight 114.3 kg (252 lb), last menstrual period 12/10/2015, SpO2 100 %.  Physical Exam  Nursing note and vitals reviewed. Constitutional: She is oriented to person, place, and time. She appears well-developed and well-nourished. No distress.  HENT:  Head: Normocephalic and atraumatic.  Neck: Normal range of motion.  Cardiovascular: Normal rate.   Respiratory: Effort normal.  GI: Soft. She exhibits no distension. There is no tenderness.  Genitourinary:  Genitourinary Comments: External: no lesions or erythema Vagina: rugated, parous/ nulli, small drk red blood in vagina, no bleeding from os Cervix closed/long  Musculoskeletal: Normal range of motion.  Neurological: She is alert and oriented to person, place, and time.  Skin: Skin is warm and dry.  Psychiatric: She has a normal mood and affect.   FHT A: 160 bpm FHT B: 152 bpm  Results for orders placed or performed during the hospital encounter of 03/22/16 (from the past 24 hour(s))  Wet prep, genital     Status: Abnormal   Collection Time: 03/22/16  9:55 PM  Result Value Ref Range   Yeast Wet Prep HPF POC NONE SEEN NONE SEEN   Trich, Wet Prep NONE SEEN NONE SEEN   Clue Cells Wet Prep HPF POC NONE SEEN NONE SEEN   WBC, Wet Prep HPF POC MODERATE (A) NONE SEEN   Sperm NONE SEEN    MAU Course  Procedures  MDM Labs ordered and reviewed. Recent US shows anterior placenta above os x2. No identification of SCH. Etiology of bleeding unclear. Bleeding small, not active. Stable for discharge home.  Assessment and Plan  14 week didi pregnancy Vaginal bleeding Rh positive  Discharge home Follow up as  scheduled in [facility] SAB precautions  Allergies as of 03/22/2016   No Known Allergies     Medication List    TAKE these medications   accu-chek softclix lancets Use as instructed   glucose blood test strip Commonly known as:  ACCU-CHEK AVIVA PLUS Use as instructed   glyBURIDE 2.5 MG tablet Commonly known as:  DIABETA Take 1 tablet (2.5 mg total) by mouth 2 (two) times daily with a meal.   metFORMIN 500 MG tablet Commonly known as:  GLUCOPHAGE Take 1 tablet (500 mg total) by mouth 2 (two) times daily with a meal.   Prenatal Vitamins 0.8 MG tablet Take 1 tablet by mouth daily.      Donette LarryMelanie Keamber Macfadden, CNM 03/22/2016, 9:49 PM

## 2016-03-22 NOTE — Discharge Instructions (Signed)
Vaginal Bleeding During Pregnancy, Second Trimester °A small amount of bleeding (spotting) from the vagina is common in pregnancy. Sometimes the bleeding is normal and is not a problem, and sometimes it is a sign of something serious. Be sure to tell your doctor about any bleeding from your vagina right away. °Follow these instructions at home: °· Watch your condition for any changes. °· Follow your doctor's instructions about how active you can be. °· If you are on bed rest: °¨ You may need to stay in bed and only get up to use the bathroom. °¨ You may be allowed to do some activities. °¨ If you need help, make plans for someone to help you. °· Write down: °¨ The number of pads you use each day. °¨ How often you change pads. °¨ How soaked (saturated) your pads are. °· Do not use tampons. °· Do not douche. °· Do not have sex or orgasms until your doctor says it is okay. °· If you pass any tissue from your vagina, save the tissue so you can show it to your doctor. °· Only take medicines as told by your doctor. °· Do not take aspirin because it can make you bleed. °· Do not exercise, lift heavy weights, or do any activities that take a lot of energy and effort unless your doctor says it is okay. °· Keep all follow-up visits as told by your doctor. °Contact a doctor if: °· You bleed from your vagina. °· You have cramps. °· You have labor pains. °· You have a fever that does not go away after you take medicine. °Get help right away if: °· You have very bad cramps in your back or belly (abdomen). °· You have contractions. °· You have chills. °· You pass large clots or tissue from your vagina. °· You bleed more. °· You feel light-headed or weak. °· You pass out (faint). °· You are leaking fluid or have a gush of fluid from your vagina. °This information is not intended to replace advice given to you by your health care provider. Make sure you discuss any questions you have with your health care provider. °Document  Released: 07/01/2013 Document Revised: 07/23/2015 Document Reviewed: 10/22/2012 °Elsevier Interactive Patient Education © 2017 Elsevier Inc. ° °

## 2016-03-23 LAB — GC/CHLAMYDIA PROBE AMP (~~LOC~~) NOT AT ARMC
Chlamydia: NEGATIVE
NEISSERIA GONORRHEA: NEGATIVE

## 2016-03-24 ENCOUNTER — Encounter: Payer: Self-pay | Admitting: Family Medicine

## 2016-03-24 ENCOUNTER — Ambulatory Visit (INDEPENDENT_AMBULATORY_CARE_PROVIDER_SITE_OTHER): Payer: Medicaid Other | Admitting: Family Medicine

## 2016-03-24 VITALS — BP 121/76 | HR 115 | Wt 250.1 lb

## 2016-03-24 DIAGNOSIS — O24112 Pre-existing diabetes mellitus, type 2, in pregnancy, second trimester: Secondary | ICD-10-CM

## 2016-03-24 DIAGNOSIS — O0991 Supervision of high risk pregnancy, unspecified, first trimester: Secondary | ICD-10-CM

## 2016-03-24 DIAGNOSIS — O30049 Twin pregnancy, dichorionic/diamniotic, unspecified trimester: Secondary | ICD-10-CM

## 2016-03-24 DIAGNOSIS — O30042 Twin pregnancy, dichorionic/diamniotic, second trimester: Secondary | ICD-10-CM

## 2016-03-24 NOTE — Progress Notes (Signed)
Subjective:  Kathy Sandoval is a 31 y.o. G3P2002 at 743w0d being seen today for ongoing prenatal care.  She is currently monitored for the following issues for this high-risk pregnancy and has MIGRAINE, UNSPEC., W/O INTRACTABLE MIGRAINE; Obesity; Type 2 diabetes mellitus in pregnancy; Supervision of high risk pregnancy in first trimester; Dichorionic diamniotic twin pregnancy, antepartum; GBS bacteriuria; and Rubella non-immune status, antepartum on her problem list.  GDM: Patient taking glyburide 2.5mg  twice daily, metformin 500mg  BID.  Reports no hypoglycemic episodes.  Tolerating medication well. Forgot blood sugars but reports CBGs as: Fasting: <90 2hr PP: 120-130 a couple times a week, but mostly controlled.  Patient reports having some vaginal bleeding. Has been evaluated in MAU a couple of times - normal US. Now having marroon discharge..   Kathy Sandoval. Vag. Bleeding: Small.   . Denies leaking of fluid.   The following portions of the patient's history were reviewed and updated as appropriate: allergies, current medications, past family history, past medical history, past social history, past surgical history and problem list. Problem list updated.  Objective:   Vitals:   03/24/16 1059  BP: 121/76  Pulse: (!) 115  Weight: 250 lb 1.6 oz (113.4 kg)    Fetal Status: Fetal Heart Rate (bpm): 154         General:  Alert, oriented and cooperative. Patient is in no acute distress.  Skin: Skin is warm and dry. No rash noted.   Cardiovascular: Normal heart rate noted  Respiratory: Normal respiratory effort, no problems with respiration noted  Abdomen: Soft, gravid, appropriate for gestational age. Pain/Pressure: Present     Pelvic: Vag. Bleeding: Small     Cervical exam deferred        Extremities: Normal range of motion.  Edema: None  Mental Status: Normal mood and affect. Normal behavior. Normal judgment and thought content.   Urinalysis:      Assessment and Plan:  Pregnancy: G3P2002 at  1103w0d  1. Supervision of high risk pregnancy in first trimester FHT normal  2. Pregnancy with type 2 diabetes mellitus in second trimester Continue Dm meds.   3. Dichorionic diamniotic twin pregnancy, antepartum US on 20th for fetal survey  Preterm labor symptoms and general obstetric precautions including but not limited to vaginal bleeding, contractions, leaking of fluid and fetal movement were reviewed in detail with the patient. Please refer to After Visit Summary for other counseling recommendations.  No Follow-up on file.   Levie HeritageJacob J Evertt Chouinard, DO

## 2016-04-08 ENCOUNTER — Ambulatory Visit (INDEPENDENT_AMBULATORY_CARE_PROVIDER_SITE_OTHER): Payer: Medicaid Other | Admitting: Obstetrics and Gynecology

## 2016-04-08 VITALS — BP 121/79 | HR 112 | Wt 252.8 lb

## 2016-04-08 DIAGNOSIS — O09899 Supervision of other high risk pregnancies, unspecified trimester: Secondary | ICD-10-CM

## 2016-04-08 DIAGNOSIS — O24112 Pre-existing diabetes mellitus, type 2, in pregnancy, second trimester: Secondary | ICD-10-CM

## 2016-04-08 DIAGNOSIS — O0990 Supervision of high risk pregnancy, unspecified, unspecified trimester: Secondary | ICD-10-CM

## 2016-04-08 DIAGNOSIS — O9989 Other specified diseases and conditions complicating pregnancy, childbirth and the puerperium: Secondary | ICD-10-CM

## 2016-04-08 DIAGNOSIS — R8271 Bacteriuria: Secondary | ICD-10-CM

## 2016-04-08 DIAGNOSIS — Z23 Encounter for immunization: Secondary | ICD-10-CM | POA: Diagnosis not present

## 2016-04-08 DIAGNOSIS — O0991 Supervision of high risk pregnancy, unspecified, first trimester: Secondary | ICD-10-CM

## 2016-04-08 DIAGNOSIS — Z283 Underimmunization status: Secondary | ICD-10-CM

## 2016-04-08 DIAGNOSIS — O30049 Twin pregnancy, dichorionic/diamniotic, unspecified trimester: Secondary | ICD-10-CM

## 2016-04-08 DIAGNOSIS — O30042 Twin pregnancy, dichorionic/diamniotic, second trimester: Secondary | ICD-10-CM | POA: Diagnosis not present

## 2016-04-08 NOTE — Patient Instructions (Signed)
Second Trimester of Pregnancy The second trimester is from week 13 through week 28 (months 4 through 6). The second trimester is often a time when you feel your best. Your body has also adjusted to being pregnant, and you begin to feel better physically. Usually, morning sickness has lessened or quit completely, you may have more energy, and you may have an increase in appetite. The second trimester is also a time when the fetus is growing rapidly. At the end of the sixth month, the fetus is about 9 inches long and weighs about 1 pounds. You will likely begin to feel the baby move (quickening) between 18 and 20 weeks of the pregnancy. Body changes during your second trimester Your body continues to go through many changes during your second trimester. The changes vary from woman to woman.  Your weight will continue to increase. You will notice your lower abdomen bulging out.  You may begin to get stretch marks on your hips, abdomen, and breasts.  You may develop headaches that can be relieved by medicines. The medicines should be approved by your health care provider.  You may urinate more often because the fetus is pressing on your bladder.  You may develop or continue to have heartburn as a result of your pregnancy.  You may develop constipation because certain hormones are causing the muscles that push waste through your intestines to slow down.  You may develop hemorrhoids or swollen, bulging veins (varicose veins).  You may have back pain. This is caused by:  Weight gain.  Pregnancy hormones that are relaxing the joints in your pelvis.  A shift in weight and the muscles that support your balance.  Your breasts will continue to grow and they will continue to become tender.  Your gums may bleed and may be sensitive to brushing and flossing.  Dark spots or blotches (chloasma, mask of pregnancy) may develop on your face. This will likely fade after the baby is born.  A dark line  from your belly button to the pubic area (linea nigra) may appear. This will likely fade after the baby is born.  You may have changes in your hair. These can include thickening of your hair, rapid growth, and changes in texture. Some women also have hair loss during or after pregnancy, or hair that feels dry or thin. Your hair will most likely return to normal after your baby is born. What to expect at prenatal visits During a routine prenatal visit:  You will be weighed to make sure you and the fetus are growing normally.  Your blood pressure will be taken.  Your abdomen will be measured to track your baby's growth.  The fetal heartbeat will be listened to.  Any test results from the previous visit will be discussed. Your health care provider may ask you:  How you are feeling.  If you are feeling the baby move.  If you have had any abnormal symptoms, such as leaking fluid, bleeding, severe headaches, or abdominal cramping.  If you are using any tobacco products, including cigarettes, chewing tobacco, and electronic cigarettes.  If you have any questions. Other tests that may be performed during your second trimester include:  Blood tests that check for:  Low iron levels (anemia).  Gestational diabetes (between 24 and 28 weeks).  Rh antibodies. This is to check for a protein on red blood cells (Rh factor).  Urine tests to check for infections, diabetes, or protein in the urine.  An ultrasound to   confirm the proper growth and development of the baby.  An amniocentesis to check for possible genetic problems.  Fetal screens for spina bifida and Down syndrome.  HIV (human immunodeficiency virus) testing. Routine prenatal testing includes screening for HIV, unless you choose not to have this test. Follow these instructions at home: Eating and drinking  Continue to eat regular, healthy meals.  Avoid raw meat, uncooked cheese, cat litter boxes, and soil used by cats. These  carry germs that can cause birth defects in the baby.  Take your prenatal vitamins.  Take 1500-2000 mg of calcium daily starting at the 20th week of pregnancy until you deliver your baby.  If you develop constipation:  Take over-the-counter or prescription medicines.  Drink enough fluid to keep your urine clear or pale yellow.  Eat foods that are high in fiber, such as fresh fruits and vegetables, whole grains, and beans.  Limit foods that are high in fat and processed sugars, such as fried and sweet foods. Activity  Exercise only as directed by your health care provider. Experiencing uterine cramps is a good sign to stop exercising.  Avoid heavy lifting, wear low heel shoes, and practice good posture.  Wear your seat belt at all times when driving.  Rest with your legs elevated if you have leg cramps or low back pain.  Wear a good support bra for breast tenderness.  Do not use hot tubs, steam rooms, or saunas. Lifestyle  Avoid all smoking, herbs, alcohol, and unprescribed drugs. These chemicals affect the formation and growth of the baby.  Do not use any products that contain nicotine or tobacco, such as cigarettes and e-cigarettes. If you need help quitting, ask your health care provider.  A sexual relationship may be continued unless your health care provider directs you otherwise. General instructions  Follow your health care provider's instructions regarding medicine use. There are medicines that are either safe or unsafe to take during pregnancy.  Take warm sitz baths to soothe any pain or discomfort caused by hemorrhoids. Use hemorrhoid cream if your health care provider approves.  If you develop varicose veins, wear support hose. Elevate your feet for 15 minutes, 3-4 times a day. Limit salt in your diet.  Visit your dentist if you have not gone yet during your pregnancy. Use a soft toothbrush to brush your teeth and be gentle when you floss.  Keep all follow-up  prenatal visits as told by your health care provider. This is important. Contact a health care provider if:  You have dizziness.  You have mild pelvic cramps, pelvic pressure, or nagging pain in the abdominal area.  You have persistent nausea, vomiting, or diarrhea.  You have a bad smelling vaginal discharge.  You have pain with urination. Get help right away if:  You have a fever.  You are leaking fluid from your vagina.  You have spotting or bleeding from your vagina.  You have severe abdominal cramping or pain.  You have rapid weight gain or weight loss.  You have shortness of breath with chest pain.  You notice sudden or extreme swelling of your face, hands, ankles, feet, or legs.  You have not felt your baby move in over an hour.  You have severe headaches that do not go away with medicine.  You have vision changes. Summary  The second trimester is from week 13 through week 28 (months 4 through 6). It is also a time when the fetus is growing rapidly.  Your body goes   through many changes during pregnancy. The changes vary from woman to woman.  Avoid all smoking, herbs, alcohol, and unprescribed drugs. These chemicals affect the formation and growth your baby.  Do not use any tobacco products, such as cigarettes, chewing tobacco, and e-cigarettes. If you need help quitting, ask your health care provider.  Contact your health care provider if you have any questions. Keep all prenatal visits as told by your health care provider. This is important. This information is not intended to replace advice given to you by your health care provider. Make sure you discuss any questions you have with your health care provider. Document Released: 02/08/2001 Document Revised: 07/23/2015 Document Reviewed: 04/17/2012 Elsevier Interactive Patient Education  2017 Elsevier Inc.  

## 2016-04-08 NOTE — Addendum Note (Signed)
Addended by: Garret ReddishBARNES, Malcolm Quast M on: 04/08/2016 10:34 AM   Modules accepted: Orders

## 2016-04-08 NOTE — Progress Notes (Signed)
Subjective:  Kathy Sandoval is a 31 y.o. G3P2002 at 5046w1d being seen today for ongoing prenatal care.  She is currently monitored for the following issues for this high-risk pregnancy and has MIGRAINE, UNSPEC., W/O INTRACTABLE MIGRAINE; Obesity; Type 2 diabetes mellitus in pregnancy; Supervision of high risk pregnancy in first trimester; Dichorionic diamniotic twin pregnancy, antepartum; GBS bacteriuria; and Rubella non-immune status, antepartum on her problem list.  Patient reports no complaints.  Contractions: Not present. Vag. Bleeding: Bloody Show.  Movement: Present. Denies leaking of fluid.   The following portions of the patient's history were reviewed and updated as appropriate: allergies, current medications, past family history, past medical history, past social history, past surgical history and problem list. Problem list updated.  Objective:   Vitals:   04/08/16 0935  BP: 121/79  Pulse: (!) 112  Weight: 252 lb 12.8 oz (114.7 kg)    Fetal Status: Fetal Heart Rate (bpm): 164/152   Movement: Present     General:  Alert, oriented and cooperative. Patient is in no acute distress.  Skin: Skin is warm and dry. No rash noted.   Cardiovascular: Normal heart rate noted  Respiratory: Normal respiratory effort, no problems with respiration noted  Abdomen: Soft, gravid, appropriate for gestational age. Pain/Pressure: Present     Pelvic:  Cervical exam deferred        Extremities: Normal range of motion.  Edema: None  Mental Status: Normal mood and affect. Normal behavior. Normal judgment and thought content.   Urinalysis:      Assessment and Plan:  Pregnancy: G3P2002 at 4246w1d  1. Supervision of high risk pregnancy, antepartum Anatomy scan 04/19/16 - AFP, Quad Screen  2. Pregnancy with type 2 diabetes mellitus in second trimester BS in goal range Continue with current regiment  3. Dichorionic diamniotic twin pregnancy, antepartum   4. GBS bacteriuria Tx while in labor  5.  Rubella non-immune status, antepartum Vaccine PP  Preterm labor symptoms and general obstetric precautions including but not limited to vaginal bleeding, contractions, leaking of fluid and fetal movement were reviewed in detail with the patient. Please refer to After Visit Summary for other counseling recommendations.  Return in about 3 weeks (around 04/29/2016) for OB visit.   Hermina StaggersMichael L Ervin, MD

## 2016-04-12 LAB — AFP, QUAD SCREEN
AFP: 48.4 ng/mL
Age Alone: 1:546 {titer}
Curr Gest Age: 17.3 weeks
Down Syndrome Scr Risk Est: 1:160 {titer}
HCG, Total: 58.2 IU/mL
INH: 346.8 pg/mL
Interpretation-AFP: POSITIVE — AB
MOM FOR AFP: 1.83
MOM FOR INH: 3.34
MoM for hCG: 3.04
Open Spina bifida: NEGATIVE
Osb Risk: 1:2410 {titer}
uE3 Mom: 1.11
uE3 Value: 1.07 ng/mL

## 2016-04-13 ENCOUNTER — Telehealth: Payer: Self-pay | Admitting: *Deleted

## 2016-04-13 DIAGNOSIS — O280 Abnormal hematological finding on antenatal screening of mother: Secondary | ICD-10-CM

## 2016-04-13 DIAGNOSIS — O30042 Twin pregnancy, dichorionic/diamniotic, second trimester: Secondary | ICD-10-CM

## 2016-04-13 NOTE — Telephone Encounter (Signed)
Called pt back after she had left message with questions regarding Quad screen test results. She saw the results on My Chart and wanted to know if one of her babies definitely has Down's Syndrome.  I left a message on her personal voice mail stating that the Quad screen is a screening only test. These results do not mean that her baby definitely has Down's syndrome. The doctor has recommended that she meet with the Genetics Counselor so that the test results can be explained further and her questions can be answered. Her US appt for 2/20 has been changed to 2/22 @ 1245 so that she can also meet with the [facility] on the same day following the US. If she has additional questions, she can call back to our office.

## 2016-04-19 ENCOUNTER — Other Ambulatory Visit (HOSPITAL_COMMUNITY): Payer: Self-pay | Admitting: Maternal and Fetal Medicine

## 2016-04-19 ENCOUNTER — Ambulatory Visit (HOSPITAL_COMMUNITY)
Admission: RE | Admit: 2016-04-19 | Discharge: 2016-04-19 | Disposition: A | Discharge status: Home / Self Care | Payer: Medicaid Other | Source: Ambulatory Visit | Attending: Obstetrics and Gynecology | Admitting: Obstetrics and Gynecology

## 2016-04-19 ENCOUNTER — Ambulatory Visit (HOSPITAL_COMMUNITY): Payer: Medicaid Other

## 2016-04-19 ENCOUNTER — Other Ambulatory Visit: Payer: Self-pay

## 2016-04-19 ENCOUNTER — Encounter (HOSPITAL_COMMUNITY): Payer: Self-pay

## 2016-04-19 VITALS — BP 106/80 | HR 116 | Wt 252.4 lb

## 2016-04-19 DIAGNOSIS — Z3689 Encounter for other specified antenatal screening: Secondary | ICD-10-CM | POA: Diagnosis not present

## 2016-04-19 DIAGNOSIS — O283 Abnormal ultrasonic finding on antenatal screening of mother: Secondary | ICD-10-CM | POA: Diagnosis not present

## 2016-04-19 DIAGNOSIS — O30042 Twin pregnancy, dichorionic/diamniotic, second trimester: Secondary | ICD-10-CM | POA: Diagnosis not present

## 2016-04-19 DIAGNOSIS — O281 Abnormal biochemical finding on antenatal screening of mother: Secondary | ICD-10-CM

## 2016-04-19 DIAGNOSIS — O24112 Pre-existing diabetes mellitus, type 2, in pregnancy, second trimester: Secondary | ICD-10-CM | POA: Diagnosis not present

## 2016-04-19 DIAGNOSIS — O30049 Twin pregnancy, dichorionic/diamniotic, unspecified trimester: Secondary | ICD-10-CM

## 2016-04-19 DIAGNOSIS — O99212 Obesity complicating pregnancy, second trimester: Secondary | ICD-10-CM

## 2016-04-19 DIAGNOSIS — Z3A18 18 weeks gestation of pregnancy: Secondary | ICD-10-CM | POA: Diagnosis not present

## 2016-04-19 DIAGNOSIS — O24912 Unspecified diabetes mellitus in pregnancy, second trimester: Secondary | ICD-10-CM

## 2016-04-19 DIAGNOSIS — O280 Abnormal hematological finding on antenatal screening of mother: Secondary | ICD-10-CM

## 2016-04-21 ENCOUNTER — Ambulatory Visit (HOSPITAL_COMMUNITY): Payer: Medicaid Other

## 2016-04-25 ENCOUNTER — Encounter: Payer: Medicaid Other | Admitting: Family Medicine

## 2016-04-25 NOTE — Progress Notes (Signed)
Genetic Counseling  High-Risk Gestation Note  Appointment Date:  04/19/2016 Referred By: Catalina Antiguaonstant, Peggy, MD Date of Birth:  09/23/1985   Pregnancy History: [id number] Estimated Date of Delivery: 09/15/16 Estimated Gestational Age: 1655w5d   Ms. Danella Deisorma Bondar was seen for genetic counseling because of an increased risk for fetal Down syndrome based on maternal serum Quad screening. Ms. Hardie ShackletonHernandez's mother and the father of the baby's cousin also attended the genetic counseling appointment today.  In summary:  Reviewed results of screening test  Increased risk for Down syndrome  Discussed additional screening options  NIPS-performed today  Ultrasound-performed today  Discussed diagnostic testing options  Amniocentesis-declined  Reviewed family history concerns  Discussed general population carrier screening options  CF, SMA, and hemoglobinopathies-declined  She was counseled regarding the screening result and the associated 1 in 160 risk for fetal Down syndrome. We reviewed chromosomes, nondisjunction, and the common features and variable prognosis of Down syndrome. In addition, we reviewed the screen adjusted reduction in risks for trisomy 18 and open neural tube defects.  We also discussed other explanations for a screen positive result including: a gestational dating error, differences in maternal metabolism, and normal variation.  We reviewed other available screening options including noninvasive prenatal screening (NIPS)/cell free DNA (cfDNA) screening, and detailed ultrasound.  She was counseled that screening tests are used to modify a patient's a priori risk for aneuploidy, typically based on age. This estimate provides a pregnancy specific risk assessment. We reviewed the benefits and limitations of each option. Specifically, we discussed the conditions for which each test screens, the detection rates, and false positive rates of each. She was also counseled regarding  diagnostic testing via amniocentesis. We reviewed the approximate 1 in 500 risk for complications from amniocentesis, including spontaneous pregnancy loss. We discussed the possible results that the tests might provide including: positive, negative, unanticipated, and no result. Finally, she was counseled regarding the cost of each option and potential out of pocket expenses. After consideration of all the options, she elected to proceed with NIPS.  Those results will be available in 5-7 days. The patient also expressed interest in having a detailed ultrasound.  A complete ultrasound was performed today. The ultrasound report will be documented separately. There were no visualized fetal anomalies or markers suggestive of aneuploidy.  Amniocentesis was declined today. She understands that screening tests cannot rule out all birth defects or genetic syndromes. The patient was advised of this limitation and states she still does not want additional testing at this time.   Ms. Ardyth HarpsHernandez was provided with written information regarding cystic fibrosis (CF), spinal muscular atrophy (SMA) and hemoglobinopathies including the carrier frequency, availability of carrier screening and prenatal diagnosis if indicated.  In addition, we discussed that CF and hemoglobinopathies are routinely screened for as part of the [place] newborn screening panel.  After further discussion, she declined screening for CF, SMA and hemoglobinopathies.   Both family histories were reviewed and found to be noncontributory for birth defects, intellectual disability, and known genetic conditions.  Without further information regarding the provided family history, an accurate genetic risk cannot be calculated. Further genetic counseling is warranted if more information is obtained.  Ms. Ardyth HarpsHernandez denied exposure to environmental toxins or chemical agents. She denied the use of alcohol, tobacco or street drugs. She denied significant viral illnesses  during the course of her pregnancy.   I counseled Ms. Ardyth HarpsHernandez for approximately 36 minutes regarding the above risks and available options.   Donald Prosehristy S. Emmaly Leech, MS Certified  Dentist

## 2016-05-02 ENCOUNTER — Telehealth (HOSPITAL_COMMUNITY): Payer: Self-pay

## 2016-05-02 NOTE — Telephone Encounter (Signed)
Called Kathy Sandoval to discuss her prenatal cell free DNA test results. Ms. Kathy Sandoval had Panorama testing through [facility].  Testing was offered because of an abnormal Quad screen. This is a twin pregnancy. The patient was identified by name and DOB. We reviewed that these are within normal limits, showing a less than 1 in 10,000 risk for trisomies 21, 18 and 13. Testing was also consistent with dizygotic twins and a female and  female fetal sex. The patient did wish to know fetal sex.  She understands that this testing does not identify all genetic conditions.  All questions were answered to her satisfaction, she was encouraged to call with additional questions or concerns.  Despina AriasSTANLEY, Dazaria Macneill, MS Certified Genetic Counselor

## 2016-05-03 ENCOUNTER — Ambulatory Visit (INDEPENDENT_AMBULATORY_CARE_PROVIDER_SITE_OTHER): Payer: Medicaid Other | Admitting: Family

## 2016-05-03 VITALS — BP 119/77 | HR 119 | Wt 251.2 lb

## 2016-05-03 DIAGNOSIS — O0991 Supervision of high risk pregnancy, unspecified, first trimester: Secondary | ICD-10-CM

## 2016-05-03 DIAGNOSIS — O24112 Pre-existing diabetes mellitus, type 2, in pregnancy, second trimester: Secondary | ICD-10-CM

## 2016-05-03 DIAGNOSIS — O30049 Twin pregnancy, dichorionic/diamniotic, unspecified trimester: Secondary | ICD-10-CM

## 2016-05-03 DIAGNOSIS — O30042 Twin pregnancy, dichorionic/diamniotic, second trimester: Secondary | ICD-10-CM

## 2016-05-03 DIAGNOSIS — O280 Abnormal hematological finding on antenatal screening of mother: Secondary | ICD-10-CM

## 2016-05-03 LAB — GLUCOSE, CAPILLARY: GLUCOSE-CAPILLARY: 120 mg/dL — AB (ref 65–99)

## 2016-05-03 LAB — POCT URINALYSIS DIP (DEVICE)
Bilirubin Urine: NEGATIVE
GLUCOSE, UA: NEGATIVE mg/dL
Hgb urine dipstick: NEGATIVE
Ketones, ur: 80 mg/dL — AB
LEUKOCYTES UA: NEGATIVE
NITRITE: NEGATIVE
Protein, ur: NEGATIVE mg/dL
SPECIFIC GRAVITY, URINE: 1.01 (ref 1.005–1.030)
UROBILINOGEN UA: 0.2 mg/dL (ref 0.0–1.0)
pH: 6 (ref 5.0–8.0)

## 2016-05-03 NOTE — Progress Notes (Signed)
   PRENATAL VISIT NOTE  Subjective:  Kathy Sandoval is a 31 y.o. G3P2002 at 3582w5d being seen today for ongoing prenatal care.  She is currently monitored for the following issues for this high-risk pregnancy and has MIGRAINE, UNSPEC., W/O INTRACTABLE MIGRAINE; Obesity; Type 2 diabetes mellitus in pregnancy; Supervision of high risk pregnancy in first trimester; Dichorionic diamniotic twin pregnancy, antepartum; GBS bacteriuria; Rubella non-immune status, antepartum; and Abnormal quad screen on her problem list.  Patient reports round ligament pain on left side.  Did not bring CBG log; reports postprandial wnl, few fasting high 90's.   Contractions: Not present.  .  Movement: Present. Denies leaking of fluid.   The following portions of the patient's history were reviewed and updated as appropriate: allergies, current medications, past family history, past medical history, past social history, past surgical history and problem list. Problem list updated.  Objective:   Vitals:   05/03/16 0812  BP: 119/77  Pulse: (!) 119  Weight: 251 lb 3.2 oz (113.9 kg)    Fetal Status: Fetal Heart Rate (bpm): 155/158 Fundal Height: 36 cm Movement: Present     General:  Alert, oriented and cooperative. Patient is in no acute distress.  Skin: Skin is warm and dry. No rash noted.   Cardiovascular: Normal heart rate noted  Respiratory: Normal respiratory effort, no problems with respiration noted  Abdomen: Soft, gravid, appropriate for gestational age. Pain/Pressure: Present     Pelvic:  Cervical exam deferred        Extremities: Normal range of motion.     Mental Status: Normal mood and affect. Normal behavior. Normal judgment and thought content.   Assessment and Plan:  Pregnancy: G3P2002 at 5982w5d  1. Supervision of high risk pregnancy in first trimester - Reviewed ultrasound results x 2, limited, rescheduled for 4 wks - Reviewed Panorama > nml - Discussed normalcy of round ligament pain  2.  Dichorionic diamniotic twin pregnancy, antepartum - Normal growth  3. Pre-existing type 2 diabetes mellitus during pregnancy in second trimester - US Fetal Echocardiography; Future - US FETAL 2D ECHO W/WO M-MODE ADDTL GEST; Future - Scheduled opth exam - Explained importance of bringing log to adjust meds appropriately  4. Abnormal quad screen - Reviewed Panorama results > wnl  Preterm labor symptoms and general obstetric precautions including but not limited to vaginal bleeding, contractions, leaking of fluid and fetal movement were reviewed in detail with the patient. Please refer to After Visit Summary for other counseling recommendations.  Return in about 2 weeks (around 05/17/2016).   Eino FarberWalidah Kennith GainN Karim, CNM

## 2016-05-17 ENCOUNTER — Ambulatory Visit (HOSPITAL_COMMUNITY)
Admission: RE | Admit: 2016-05-17 | Discharge: 2016-05-17 | Disposition: A | Discharge status: Home / Self Care | Payer: Medicaid Other | Source: Ambulatory Visit | Attending: Obstetrics and Gynecology | Admitting: Obstetrics and Gynecology

## 2016-05-17 ENCOUNTER — Encounter (HOSPITAL_COMMUNITY): Payer: Self-pay

## 2016-05-17 ENCOUNTER — Other Ambulatory Visit (HOSPITAL_COMMUNITY): Payer: Self-pay | Admitting: *Deleted

## 2016-05-17 DIAGNOSIS — O30042 Twin pregnancy, dichorionic/diamniotic, second trimester: Secondary | ICD-10-CM | POA: Insufficient documentation

## 2016-05-17 DIAGNOSIS — O24112 Pre-existing diabetes mellitus, type 2, in pregnancy, second trimester: Secondary | ICD-10-CM | POA: Insufficient documentation

## 2016-05-17 DIAGNOSIS — O99212 Obesity complicating pregnancy, second trimester: Secondary | ICD-10-CM | POA: Insufficient documentation

## 2016-05-17 DIAGNOSIS — O283 Abnormal ultrasonic finding on antenatal screening of mother: Secondary | ICD-10-CM | POA: Insufficient documentation

## 2016-05-17 DIAGNOSIS — O30049 Twin pregnancy, dichorionic/diamniotic, unspecified trimester: Secondary | ICD-10-CM

## 2016-05-17 DIAGNOSIS — Z3A22 22 weeks gestation of pregnancy: Secondary | ICD-10-CM | POA: Insufficient documentation

## 2016-05-18 ENCOUNTER — Ambulatory Visit (INDEPENDENT_AMBULATORY_CARE_PROVIDER_SITE_OTHER): Payer: Medicaid Other | Admitting: Family Medicine

## 2016-05-18 VITALS — BP 113/59 | HR 111 | Wt 250.3 lb

## 2016-05-18 DIAGNOSIS — O0991 Supervision of high risk pregnancy, unspecified, first trimester: Secondary | ICD-10-CM

## 2016-05-18 DIAGNOSIS — O30049 Twin pregnancy, dichorionic/diamniotic, unspecified trimester: Secondary | ICD-10-CM

## 2016-05-18 DIAGNOSIS — O30042 Twin pregnancy, dichorionic/diamniotic, second trimester: Secondary | ICD-10-CM

## 2016-05-18 DIAGNOSIS — O24112 Pre-existing diabetes mellitus, type 2, in pregnancy, second trimester: Secondary | ICD-10-CM

## 2016-05-18 MED ORDER — ASPIRIN EC 81 MG PO TBEC: 81.0000 mg | DELAYED_RELEASE_TABLET | Freq: Every day | ORAL | 3 refills | Status: DC

## 2016-05-18 NOTE — Progress Notes (Signed)
    PRENATAL VISIT NOTE  Subjective:  Kathy Sandoval is a 31 y.o. G3P2002 at 2254w6d being seen today for ongoing prenatal care.  She is currently monitored for the following issues for this high-risk pregnancy and has MIGRAINE, UNSPEC., W/O INTRACTABLE MIGRAINE; Obesity; Type 2 diabetes mellitus in pregnancy; Supervision of high risk pregnancy in first trimester; Dichorionic diamniotic twin pregnancy, antepartum; GBS bacteriuria; Rubella non-immune status, antepartum; and Abnormal quad screen on her problem list.  Patient reports no complaints.  Contractions: Not present.  .  Movement: Present. Denies leaking of fluid.   The following portions of the patient's history were reviewed and updated as appropriate: allergies, current medications, past family history, past medical history, past social history, past surgical history and problem list. Problem list updated.  Objective:   Vitals:   05/18/16 1102  BP: (!) 113/59  Pulse: (!) 111  Weight: 250 lb 4.8 oz (113.5 kg)    Fetal Status: Fetal Heart Rate (bpm): 151/162   Movement: Present     General:  Alert, oriented and cooperative. Patient is in no acute distress.  Skin: Skin is warm and dry. No rash noted.   Cardiovascular: Normal heart rate noted  Respiratory: Normal respiratory effort, no problems with respiration noted  Abdomen: Soft, gravid, appropriate for gestational age. Pain/Pressure: Present     Pelvic:  Cervical exam deferred        Extremities: Normal range of motion.     Mental Status: Normal mood and affect. Normal behavior. Normal judgment and thought content.  FBS 89-98 most are > 95  2 hour pp 95-143(8 of 28 within range) U/S 05/17/16 A variable 631 gm B transverse 582 gm 8% discordance Assessment and Plan:  Pregnancy: G3P2002 at 4754w6d  1. Pregnancy with type 2 diabetes mellitus in second trimester To improve fasting BS adjust glyburide to take at hs with snack withprotein - aspirin EC 81 MG tablet; Take 1  tablet (81 mg total) by mouth daily.  Dispense: 90 tablet; Refill: 3  2. Supervision of high risk pregnancy in first trimester   3. Dichorionic diamniotic twin pregnancy, antepartum No discordancy---q 4 wk u/s for growth  General obstetric precautions including but not limited to vaginal bleeding, contractions, leaking of fluid and fetal movement were reviewed in detail with the patient. Please refer to After Visit Summary for other counseling recommendations.  Return in 3 weeks (on 06/08/2016).   Reva Boresanya S Jenine Krisher, MD

## 2016-05-18 NOTE — Patient Instructions (Signed)
Segundo trimestre de embarazo (Second Trimester of Pregnancy) El segundo trimestre va desde la semana13 hasta la 28, desde el cuarto hasta el sexto mes, y suele ser el momento en el que mejor se siente. Su organismo se ha adaptado a estar embarazada y comienza a sentirse fsicamente mejor. En general, las nuseas matutinas han disminuido o han desaparecido completamente, puede tener ms energa y un aumento de apetito. El segundo trimestre es tambin la poca en la que el feto se desarrolla rpidamente. Hacia el final del sexto mes, el feto mide aproximadamente 9pulgadas (23cm) y pesa alrededor de 1 libras (700g). Es probable que sienta que el beb se mueve (da pataditas) entre las 18 y 20semanas del embarazo. CAMBIOS EN EL ORGANISMO Su organismo atraviesa por muchos cambios durante el embarazo, y estos varan de una mujer a otra.  Seguir aumentando de peso. Notar que la parte baja del abdomen sobresale.  Podrn aparecer las primeras estras en las caderas, el abdomen y las mamas.  Es posible que tenga dolores de cabeza que pueden aliviarse con los medicamentos que el mdico le permita tomar.  Tal vez tenga necesidad de orinar con ms frecuencia porque el feto est ejerciendo presin sobre la vejiga.  Debido al embarazo podr sentir acidez estomacal con frecuencia.  Puede estar estreida, ya que ciertas hormonas enlentecen los movimientos de los msculos que empujan los desechos a travs de los intestinos.  Pueden aparecer hemorroides o abultarse e hincharse las venas (venas varicosas).  Puede tener dolor de espalda que se debe al aumento de peso y a que las hormonas del embarazo relajan las articulaciones entre los huesos de la pelvis, y como consecuencia de la modificacin del peso y los msculos que mantienen el equilibrio.  Las mamas seguirn creciendo y le dolern.  Las encas pueden sangrar y estar sensibles al cepillado y al hilo dental.  Pueden aparecer zonas oscuras o  manchas (cloasma, mscara del embarazo) en el rostro que probablemente se atenuar despus del nacimiento del beb.  Es posible que se forme una lnea oscura desde el ombligo hasta la zona del pubis (linea nigra) que probablemente se atenuar despus del nacimiento del beb.  Tal vez haya cambios en el cabello que pueden incluir su engrosamiento, crecimiento rpido y cambios en la textura. Adems, a algunas mujeres se les cae el cabello durante o despus del embarazo, o tienen el cabello seco o fino. Lo ms probable es que el cabello se le normalice despus del nacimiento del beb. QU DEBE ESPERAR EN LAS CONSULTAS PRENATALES Durante una visita prenatal de rutina:  La pesarn para asegurarse de que usted y el feto estn creciendo normalmente.  Le tomarn la presin arterial.  Le medirn el abdomen para controlar el desarrollo del beb.  Se escucharn los latidos cardacos fetales.  Se evaluarn los resultados de los estudios solicitados en visitas anteriores. El mdico puede preguntarle lo siguiente:  Cmo se siente.  Si siente los movimientos del beb.  Si ha tenido sntomas anormales, como prdida de lquido, sangrado, dolores de cabeza intensos o clicos abdominales.  Si est consumiendo algn producto que contenga tabaco, como cigarrillos, tabaco de mascar y cigarrillos electrnicos.  Si tiene alguna pregunta. Otros estudios que podrn realizarse durante el segundo trimestre incluyen lo siguiente:  Anlisis de sangre para detectar lo siguiente:  Concentraciones de hierro bajas (anemia).  Diabetes gestacional (entre la semana 24 y la 28).  Anticuerpos Rh.  Anlisis de orina para detectar infecciones, diabetes o protenas en la orina.    Una ecografa para confirmar que el beb crece y se desarrolla correctamente.  Una amniocentesis para diagnosticar posibles problemas genticos.  Estudios del feto para descartar espina bfida y sndrome de Down.  Prueba del VIH (virus  de inmunodeficiencia humana). Los exmenes prenatales de rutina incluyen la prueba de deteccin del VIH, a menos que decida no realizrsela. INSTRUCCIONES PARA EL CUIDADO EN EL HOGAR  Evite fumar, consumir hierbas, beber alcohol y tomar frmacos que no le hayan recetado. Estas sustancias qumicas afectan la formacin y el desarrollo del beb.  No consuma ningn producto que contenga tabaco, lo que incluye cigarrillos, tabaco de mascar y cigarrillos electrnicos. Si necesita ayuda para dejar de fumar, consulte al mdico. Puede recibir asesoramiento y otro tipo de recursos para dejar de fumar.  Siga las indicaciones del mdico en relacin con el uso de medicamentos. Durante el embarazo, hay medicamentos que son seguros de tomar y otros que no.  Haga ejercicio solamente como se lo haya indicado el mdico. Sentir clicos uterinos es un buen signo para detener la actividad fsica.  Contine comiendo alimentos sanos con regularidad.  Use un sostn que le brinde buen soporte si le duelen las mamas.  No se d baos de inmersin en agua caliente, baos turcos ni saunas.  Use el cinturn de seguridad en todo momento mientras conduce.  No coma carne cruda ni queso sin cocinar; evite el contacto con las bandejas sanitarias de los gatos y la tierra que estos animales usan. Estos elementos contienen grmenes que pueden causar defectos congnitos en el beb.  Tome las vitaminas prenatales.  Tome entre 1500 y 2000mg de calcio diariamente comenzando en la semana20 del embarazo hasta el parto.  Si est estreida, pruebe un laxante suave (si el mdico lo autoriza). Consuma ms alimentos ricos en fibra, como vegetales y frutas frescos y cereales integrales. Beba gran cantidad de lquido para mantener la orina de tono claro o color amarillo plido.  Dese baos de asiento con agua tibia para aliviar el dolor o las molestias causadas por las hemorroides. Use una crema para las hemorroides si el mdico la  autoriza.  Si tiene venas varicosas, use medias de descanso. Eleve los pies durante 15minutos, 3 o 4veces por da. Limite el consumo de sal en su dieta.  No levante objetos pesados, use zapatos de tacones bajos y mantenga una buena postura.  Descanse con las piernas elevadas si tiene calambres o dolor de cintura.  Visite a su dentista si an no lo ha hecho durante el embarazo. Use un cepillo de dientes blando para higienizarse los dientes y psese el hilo dental con suavidad.  Puede seguir manteniendo relaciones sexuales, a menos que el mdico le indique lo contrario.  Concurra a todas las visitas prenatales segn las indicaciones de su mdico. SOLICITE ATENCIN MDICA SI:  Tiene mareos.  Siente clicos leves, presin en la pelvis o dolor persistente en el abdomen.  Tiene nuseas, vmitos o diarrea persistentes.  Observa una secrecin vaginal con mal olor.  Siente dolor al orinar. SOLICITE ATENCIN MDICA DE INMEDIATO SI:  Tiene fiebre.  Tiene una prdida de lquido por la vagina.  Tiene sangrado o pequeas prdidas vaginales.  Siente dolor intenso o clicos en el abdomen.  Sube o baja de peso rpidamente.  Tiene dificultad para respirar y siente dolor de pecho.  Sbitamente se le hinchan mucho el rostro, las manos, los tobillos, los pies o las piernas.  No ha sentido los movimientos del beb durante una hora.  Siente un   dolor de cabeza intenso que no se alivia con medicamentos.  Su visin se modifica. Esta informacin no tiene como fin reemplazar el consejo del mdico. Asegrese de hacerle al mdico cualquier pregunta que tenga. Document Released: 11/24/2004 Document Revised: 03/07/2014 Document Reviewed: 04/17/2012 Elsevier Interactive Patient Education  2017 [employer]   Lactancia materna (Breastfeeding) Decidir amamantar es una de las mejores elecciones que puede hacer por usted y su beb. El cambio hormonal durante el embarazo produce el desarrollo del  tejido mamario y aumenta la cantidad y el tamao de los conductos galactforos. Estas hormonas tambin permiten que las protenas, los azcares y las grasas de la sangre produzcan la leche materna en las glndulas productoras de leche. Las hormonas impiden que la leche materna sea liberada antes del nacimiento del beb, adems de impulsar el flujo de leche luego del nacimiento. Una vez que ha comenzado a amamantar, pensar en el beb, as como la succin o el llanto, pueden estimular la liberacin de leche de las glndulas productoras de leche. LOS BENEFICIOS DE AMAMANTAR Para el beb   La primera leche (calostro) ayuda a mejorar el funcionamiento del sistema digestivo del beb.  La leche tiene anticuerpos que ayudan a prevenir las infecciones en el beb.  El beb tiene una menor incidencia de asma, alergias y del sndrome de muerte sbita del lactante.  Los nutrientes en la leche materna son mejores para el beb que la leche maternizada y estn preparados exclusivamente para cubrir las necesidades del beb.  La leche materna mejora el desarrollo cerebral del beb.  Es menos probable que el beb desarrolle otras enfermedades, como obesidad infantil, asma o diabetes mellitus de tipo 2. Para usted   La lactancia materna favorece el desarrollo de un vnculo muy especial entre la madre y el beb.  Es conveniente. La leche materna siempre est disponible a la temperatura correcta y es econmica.  La lactancia materna ayuda a quemar caloras y a perder el peso ganado durante el embarazo.  Favorece la contraccin del tero al tamao que tena antes del embarazo de manera ms rpida y disminuye el sangrado (loquios) despus del parto.  La lactancia materna contribuye a reducir el riesgo de desarrollar diabetes mellitus de tipo 2, osteoporosis o cncer de mama o de ovario en el futuro. SIGNOS DE QUE EL BEB EST HAMBRIENTO Primeros signos de hambre   Aumenta su estado de alerta o actividad.  Se  estira.  Mueve la cabeza de un lado a otro.  Mueve la cabeza y abre la boca cuando se le toca la mejilla o la comisura de la boca (reflejo de bsqueda).  Aumenta las vocalizaciones, tales como sonidos de succin, se relame los labios, emite arrullos, suspiros, o chirridos.  Mueve la mano hacia la boca.  Se chupa con ganas los dedos o las manos. Signos tardos de hambre   Est agitado.  Llora de manera intermitente. Signos de hambre extrema  Los signos de hambre extrema requerirn que lo calme y lo consuele antes de que el beb pueda alimentarse adecuadamente. No espere a que se manifiesten los siguientes signos de hambre extrema para comenzar a amamantar:  Agitacin.  Llanto intenso y fuerte.  Gritos. INFORMACIN BSICA SOBRE LA LACTANCIA MATERNA Iniciacin de la lactancia materna   Encuentre un lugar cmodo para sentarse o acostarse, con un buen respaldo para el cuello y la espalda.  Coloque una almohada o una manta enrollada debajo del beb para acomodarlo a la altura de la mama (si est sentada).   Las almohadas para amamantar se han diseado especialmente a fin de servir de apoyo para los brazos y el beb mientras amamanta.  Asegrese de que el abdomen del beb est frente al suyo.  Masajee suavemente la mama. Con las yemas de los dedos, masajee la pared del pecho hacia el pezn en un movimiento circular. Esto estimula el flujo de leche. Es posible que deba continuar este movimiento mientras amamanta si la leche fluye lentamente.  Sostenga la mama con el pulgar por arriba del pezn y los otros 4 dedos por debajo de la mama. Asegrese de que los dedos se encuentren lejos del pezn y de la boca del beb.  Empuje suavemente los labios del beb con el pezn o con el dedo.  Cuando la boca del beb se abra lo suficiente, acrquelo rpidamente a la mama e introduzca todo el pezn y la zona oscura que lo rodea (areola), tanto como sea posible, dentro de la boca del beb.  Debe  haber ms areola visible por arriba del labio superior del beb que por debajo del labio inferior.  La lengua del beb debe estar entre la enca inferior y la mama.  Asegrese de que la boca del beb est en la posicin correcta alrededor del pezn (prendida). Los labios del beb deben crear un sello sobre la mama y estar doblados hacia afuera (invertidos).  Es comn que el beb succione durante 2 a 3 minutos para que comience el flujo de leche materna. Cmo debe prenderse  Es muy importante que le ensee al beb cmo prenderse adecuadamente a la mama. Si el beb no se prende adecuadamente, puede causarle dolor en el pezn y reducir la produccin de leche materna, y hacer que el beb tenga un escaso aumento de peso. Adems, si el beb no se prende adecuadamente al pezn, puede tragar aire durante la alimentacin. Esto puede causarle molestias al beb. Hacer eructar al beb al cambiar de mama puede ayudarlo a liberar el aire. Sin embargo, ensearle al beb cmo prenderse a la mama adecuadamente es la mejor manera de evitar que se sienta molesto por tragar aire mientras se alimenta. Signos de que el beb se ha prendido adecuadamente al pezn:  Tironea o succiona de modo silencioso, sin causarle dolor.  Se escucha que traga cada 3 o 4 succiones.  Hay movimientos musculares por arriba y por delante de sus odos al succionar. Signos de que el beb no se ha prendido adecuadamente al pezn:  Hace ruidos de succin o de chasquido mientras se alimenta.  Siente dolor en el pezn. Si cree que el beb no se prendi correctamente, deslice el dedo en la comisura de la boca y colquelo entre las encas del beb para interrumpir la succin. Intente comenzar a amamantar nuevamente. Signos de lactancia materna exitosa  Signos del beb:  Disminuye gradualmente el nmero de succiones o cesa la succin por completo.  Se duerme.  Relaja el cuerpo.  Retiene una pequea cantidad de leche en la boca.  Se  desprende solo del pecho. Signos que presenta usted:  Las mamas han aumentado la firmeza, el peso y el tamao 1 a 3 horas despus de amamantar.  Estn ms blandas inmediatamente despus de amamantar.  Un aumento del volumen de leche, y tambin un cambio en su consistencia y color se producen hacia el quinto da de lactancia materna.  Los pezones no duelen, ni estn agrietados ni sangran. Signos de que su beb recibe la cantidad de leche suficiente   Mojar   por lo menos 1 o 2 paales durante las primeras 24 horas despus del nacimiento.  Mojar por lo menos 5 o 6 paales cada 24 horas durante la primera semana despus del nacimiento. La orina debe ser transparente o de color amarillo plido a los 5 das despus del nacimiento.  Mojar entre 6 y 8 paales cada 24 horas a medida que el beb sigue creciendo y desarrollndose.  Defeca al menos 3 veces en 24 horas a los 5 das de vida. La materia fecal debe ser blanda y amarillenta.  Defeca al menos 3 veces en 24 horas a los 7 das de vida. La materia fecal debe ser grumosa y amarillenta.  No registra una prdida de peso mayor del 10% del peso al nacer durante los primeros 3 das de vida.  Aumenta de peso un promedio de 4 a 7onzas (113 a 198g) por semana despus de los 4 das de vida.  Aumenta de peso, diariamente, de manera uniforme a partir de los 5 das de vida, sin registrar prdida de peso despus de las 2semanas de vida. Despus de alimentarse, es posible que el beb regurgite una pequea cantidad. Esto es frecuente. FRECUENCIA Y DURACIN DE LA LACTANCIA MATERNA El amamantamiento frecuente la ayudar a producir ms leche y a prevenir problemas de dolor en los pezones e hinchazn en las mamas. Alimente al beb cuando muestre signos de hambre o si siente la necesidad de reducir la congestin de las mamas. Esto se denomina "lactancia a demanda". Evite el uso del chupete mientras trabaja para establecer la lactancia (las primeras 4 a 6  semanas despus del nacimiento del beb). Despus de este perodo, podr ofrecerle un chupete. Las investigaciones demostraron que el uso del chupete durante el primer ao de vida del beb disminuye el riesgo de desarrollar el sndrome de muerte sbita del lactante (SMSL). Permita que el nio se alimente en cada mama todo lo que desee. Contine amamantando al beb hasta que haya terminado de alimentarse. Cuando el beb se desprende o se queda dormido mientras se est alimentando de la primera mama, ofrzcale la segunda. Debido a que, con frecuencia, los recin nacidos permanecen somnolientos las primeras semanas de vida, es posible que deba despertar al beb para alimentarlo. Los horarios de lactancia varan de un beb a otro. Sin embargo, las siguientes reglas pueden servir como gua para ayudarla a garantizar que el beb se alimenta adecuadamente:  Se puede amamantar a los recin nacidos (bebs de 4 semanas o menos de vida) cada 1 a 3 horas.  No deben transcurrir ms de 3 horas durante el da o 5 horas durante la noche sin que se amamante a los recin nacidos.  Debe amamantar al beb 8 veces como mnimo en un perodo de 24 horas, hasta que comience a introducir slidos en su dieta, a los 6 meses de vida aproximadamente. EXTRACCIN DE LECHE MATERNA La extraccin y el almacenamiento de la leche materna le permiten asegurarse de que el beb se alimente exclusivamente de leche materna, aun en momentos en los que no puede amamantar. Esto tiene especial importancia si debe regresar al trabajo en el perodo en que an est amamantando o si no puede estar presente en los momentos en que el beb debe alimentarse. Su asesor en lactancia puede orientarla sobre cunto tiempo es seguro almacenar leche materna. El sacaleche es un aparato que le permite extraer leche de la mama a un recipiente estril. Luego, la leche materna extrada puede almacenarse en un refrigerador o congelador.   Algunos sacaleches son manuales,  mientras que otros son elctricos. Consulte a su asesor en lactancia qu tipo ser ms conveniente para usted. Los sacaleches se pueden comprar; sin embargo, algunos hospitales y grupos de apoyo a la lactancia materna alquilan sacaleches mensualmente. Un asesor en lactancia puede ensearle cmo extraer leche materna manualmente, en caso de que prefiera no usar un sacaleche. CMO CUIDAR LAS MAMAS DURANTE LA LACTANCIA MATERNA Los pezones se secan, agrietan y duelen durante la lactancia materna. Las siguientes recomendaciones pueden ayudarla a mantener las mamas humectadas y sanas:  Evite usar jabn en los pezones.  Use un sostn de soporte. Aunque no son esenciales, las camisetas sin mangas o los sostenes especiales para amamantar estn diseados para acceder fcilmente a las mamas, para amamantar sin tener que quitarse todo el sostn o la camiseta. Evite usar sostenes con aro o sostenes muy ajustados.  Seque al aire sus pezones durante 3 a 4minutos despus de amamantar al beb.  Utilice solo apsitos de algodn en el sostn para absorber las prdidas de leche. La prdida de un poco de leche materna entre las tomas es normal.  Utilice lanolina sobre los pezones luego de amamantar. La lanolina ayuda a mantener la humedad normal de la piel. Si usa lanolina pura, no tiene que lavarse los pezones antes de volver a alimentar al beb. La lanolina pura no es txica para el beb. Adems, puede extraer manualmente algunas gotas de leche materna y masajear suavemente esa leche sobre los pezones, para que la leche se seque al aire. Durante las primeras semanas despus de dar a luz, algunas mujeres pueden experimentar hinchazn en las mamas (congestin mamaria). La congestin puede hacer que sienta las mamas pesadas, calientes y sensibles al tacto. El pico de la congestin ocurre dentro de los 3 a 5 das despus del parto. Las siguientes recomendaciones pueden ayudarla a aliviar la congestin:  Vace por completo  las mamas al amamantar o extraer leche. Puede aplicar calor hmedo en las mamas (en la ducha o con toallas hmedas para manos) antes de amamantar o extraer leche. Esto aumenta la circulacin y ayuda a que la leche fluya. Si el beb no vaca por completo las mamas cuando lo amamanta, extraiga la leche restante despus de que haya finalizado.  Use un sostn ajustado (para amamantar o comn) o una camiseta sin mangas durante 1 o 2 das para indicar al cuerpo que disminuya ligeramente la produccin de leche.  Aplique compresas de hielo sobre las mamas, a menos que le resulte demasiado incmodo.  Asegrese de que el beb est prendido y se encuentre en la posicin correcta mientras lo alimenta. Si la congestin persiste luego de 48 horas o despus de seguir estas recomendaciones, comunquese con su mdico o un asesor en lactancia. RECOMENDACIONES GENERALES PARA EL CUIDADO DE LA SALUD DURANTE LA LACTANCIA MATERNA  Consuma alimentos saludables. Alterne comidas y colaciones, y coma 3 de cada una por da. Dado que lo que come afecta la leche materna, es posible que algunas comidas hagan que su beb se vuelva ms irritable de lo habitual. Evite comer este tipo de alimentos si percibe que afectan de manera negativa al beb.  Beba leche, jugos de fruta y agua para satisfacer su sed (aproximadamente 10 vasos al da).  Descanse con frecuencia, reljese y tome sus vitaminas prenatales para evitar la fatiga, el estrs y la anemia.  Contine con los autocontroles de la mama.  Evite masticar y fumar tabaco. Las sustancias qumicas de los cigarrillos que pasan   a la leche materna y la exposicin al humo ambiental del tabaco pueden daar al beb.  No consuma alcohol ni drogas, incluida la marihuana. Algunos medicamentos, que pueden ser perjudiciales para el beb, pueden pasar a travs de la leche materna. Es importante que consulte a su mdico antes de tomar cualquier medicamento, incluidos todos los medicamentos  recetados y de venta libre, as como los suplementos vitamnicos y herbales. Puede quedar embarazada durante la lactancia. Si desea controlar la natalidad, consulte a su mdico cules son las opciones ms seguras para el beb. SOLICITE ATENCIN MDICA SI:  Usted siente que quiere dejar de amamantar o se siente frustrada con la lactancia.  Siente dolor en las mamas o en los pezones.  Sus pezones estn agrietados o sangran.  Sus pechos estn irritados, sensibles o calientes.  Tiene un rea hinchada en cualquiera de las mamas.  Siente escalofros o fiebre.  Tiene nuseas o vmitos.  Presenta una secrecin de otro lquido distinto de la leche materna de los pezones.  Sus mamas no se llenan antes de amamantar al beb para el quinto da despus del parto.  Se siente triste y deprimida.  El beb est demasiado somnoliento como para comer bien.  El beb tiene problemas para dormir.  Moja menos de 3 paales en 24 horas.  Defeca menos de 3 veces en 24 horas.  La piel del beb o la parte blanca de los ojos se vuelven amarillentas.  El beb no ha aumentado de peso a los 5 das de vida. SOLICITE ATENCIN MDICA DE INMEDIATO SI:  El beb est muy cansado (letargo) y no se quiere despertar para comer.  Le sube la fiebre sin causa. Esta informacin no tiene como fin reemplazar el consejo del mdico. Asegrese de hacerle al mdico cualquier pregunta que tenga. Document Released: 02/14/2005 Document Revised: 06/08/2015 Document Reviewed: 08/08/2012 Elsevier Interactive Patient Education  2017 [employer]  

## 2016-06-08 ENCOUNTER — Ambulatory Visit (INDEPENDENT_AMBULATORY_CARE_PROVIDER_SITE_OTHER): Payer: Medicaid Other | Admitting: Obstetrics and Gynecology

## 2016-06-08 VITALS — BP 111/67 | HR 109 | Wt 248.9 lb

## 2016-06-08 DIAGNOSIS — E669 Obesity, unspecified: Secondary | ICD-10-CM

## 2016-06-08 DIAGNOSIS — O24112 Pre-existing diabetes mellitus, type 2, in pregnancy, second trimester: Secondary | ICD-10-CM

## 2016-06-08 DIAGNOSIS — O99212 Obesity complicating pregnancy, second trimester: Secondary | ICD-10-CM

## 2016-06-08 DIAGNOSIS — O0990 Supervision of high risk pregnancy, unspecified, unspecified trimester: Secondary | ICD-10-CM

## 2016-06-08 DIAGNOSIS — E119 Type 2 diabetes mellitus without complications: Secondary | ICD-10-CM

## 2016-06-08 DIAGNOSIS — O0991 Supervision of high risk pregnancy, unspecified, first trimester: Secondary | ICD-10-CM

## 2016-06-08 DIAGNOSIS — O0992 Supervision of high risk pregnancy, unspecified, second trimester: Secondary | ICD-10-CM | POA: Diagnosis not present

## 2016-06-08 DIAGNOSIS — Z23 Encounter for immunization: Secondary | ICD-10-CM

## 2016-06-08 LAB — POCT URINALYSIS DIP (DEVICE)
BILIRUBIN URINE: NEGATIVE
GLUCOSE, UA: NEGATIVE mg/dL
Hgb urine dipstick: NEGATIVE
KETONES UR: 80 mg/dL — AB
NITRITE: NEGATIVE
Protein, ur: 30 mg/dL — AB
Specific Gravity, Urine: 1.02 (ref 1.005–1.030)
Urobilinogen, UA: 1 mg/dL (ref 0.0–1.0)
pH: 6.5 (ref 5.0–8.0)

## 2016-06-08 MED ORDER — DOCUSATE SODIUM 100 MG PO CAPS: 100.0000 mg | ORAL_CAPSULE | Freq: Two times a day (BID) | ORAL | 0 refills | Status: DC

## 2016-06-08 MED ORDER — DOCUSATE SODIUM 100 MG PO CAPS: 100.0000 mg | ORAL_CAPSULE | Freq: Two times a day (BID) | ORAL | 3 refills | Status: DC

## 2016-06-08 NOTE — Progress Notes (Signed)
28 week labs/tdap today 

## 2016-06-08 NOTE — Patient Instructions (Signed)
Postpartum Tubal Ligation Postpartum tubal ligation (PPTL) is a procedure to close the fallopian tubes. This is done so that you cannot get pregnant. When the fallopian tubes are closed, the eggs that the ovaries release cannot enter the uterus, and sperm cannot reach the eggs. PPTL is done right after childbirth or 1-2 days after childbirth, before the uterus returns to its normal location. PPTL is sometimes called "getting your tubes tied." You should not have this procedure if you want to get pregnant someday or if you are unsure about having more children. Tell a health care provider about:  Any allergies you have.  All medicines you are taking, including vitamins, herbs, eye drops, creams, and over-the-counter medicines.  Previous problems you or members of your family have had with the use of anesthetics.  Any blood disorders you have.  Previous surgeries you have had.  Any medical conditions you may have.  Any past pregnancies. What are the risks? Generally, this is a safe procedure. However, problems may occur, including:  Infection.  Bleeding.  Injury to surrounding organs.  Side effects from anesthetics.  Failure of the procedure. This procedure can increase your risk of a kind of pregnancy in which a fertilized egg attaches to the outside of the uterus (ectopic pregnancy). What happens before the procedure?  Ask your health care provider about:  How much pain you can expect to have.  What medicines you will be given for pain, especially if you are planning to breastfeed.  Follow instructions from your health care provider about eating and drinking restrictions. What happens during the procedure? If you had a vaginal delivery:  You may be given one or more of the following:  A medicine that helps you relax (sedative).  A medicine to numb the area (local anesthetic).  A medicine to make you fall asleep (general anesthetic).  A medicine that is injected into  an area of your body to numb everything below the injection site (regional anesthetic).  If you have been given a general anesthetic, a tube will be put down your throat to help you breathe.  An IV tube will be inserted into one of your veins to give you medicines and fluids during the procedure.  Your bladder may be emptied with a small tube (catheter).  An incision will be made just below your belly button.  Your fallopian tubes will be located and brought up through the incision.  Your fallopian tubes will be tied off, burned (cauterized), or blocked with a clip, ring, or clamp. A small portion in the center of each fallopian tube may be removed.  The incision will be closed with stitches (sutures).  A bandage (dressing) will be placed over the incision. If you had a cesarean delivery:  Tubal ligation will be done through the incision that was used for the cesarean delivery of your baby.  The incision will be closed with sutures.  A dressing will be placed over the incision. The procedure may vary among health care providers and hospitals. What happens after the procedure?  Your blood pressure, heart rate, breathing rate, and blood oxygen level will be monitored often until the medicines you were given have worn off.  You will be given pain medicine as needed.  Do not drive for 24 hours if you received a sedative. This information is not intended to replace advice given to you by your health care provider. Make sure you discuss any questions you have with your health care provider. Document   Released: 02/14/2005 Document Revised: 07/20/2015 Document Reviewed: 01/25/2015 Elsevier Interactive Patient Education  2017 [employer]. Contraception Choices Contraception, also called birth control, means things to use or ways to try not to get pregnant. Hormonal birth control  This kind of birth control uses hormones. Here are some types of hormonal birth control:  A tube that is  put under skin of the arm (implant). The tube can stay in for as long as 3 years.  Shots to get every 3 months (injections).  Pills to take every day (birth control pills).  A patch to change 1 time each week for 3 weeks (birth control patch). After that, the patch is taken off for 1 week.  A ring to put in the vagina. The ring is left in for 3 weeks. Then it is taken out of the vagina for 1 week. Then a new ring is put in.  Pills to take after unprotected sex (emergency birth control pills). Barrier birth control  Here are some types of barrier birth control:  A thin covering that is put on the penis before sex (female condom). The covering is thrown away after sex.  A soft, loose covering that is put in the vagina before sex (female condom). The covering is thrown away after sex.  A rubber bowl that sits over the cervix (diaphragm). The bowl must be made for you. The bowl is put into the vagina before sex. The bowl is left in for 6-8 hours after sex. It is taken out within 24 hours.  A small, soft cup that fits over the cervix (cervical cap). The cup must be made for you. The cup can be left in for 6-8 hours after sex. It is taken out within 48 hours.  A sponge that is put into the vagina before sex. It must be left in for at least 6 hours after sex. It must be taken out within 30 hours. Then it is thrown away.  A chemical that kills or stops sperm from getting into the uterus (spermicide). It may be a pill, cream, jelly, or foam to put in the vagina. The chemical should be used at least 10-15 minutes before sex. IUD (intrauterine) birth control An IUD is a small, T-shaped piece of plastic. It is put inside the uterus. There are two kinds:  Hormone IUD. This kind can stay in for 3-5 years.  Copper IUD. This kind can stay in for 10 years. Permanent birth control Here are some types of permanent birth control:  Surgery to block the fallopian tubes.  Having an insert put into each  fallopian tube.  Surgery to tie off the tubes that carry sperm (vasectomy). Natural planning birth control Here are some types of natural planning birth control:  Not having sex on the days the woman could get pregnant.  Using a calendar:  To keep track of the length of each period.  To find out what days pregnancy can happen.  To plan to not have sex on days when pregnancy can happen.  Watching for symptoms of ovulation and not having sex during ovulation. One way the woman can check for ovulation is to check her temperature.  Waiting to have sex until after ovulation. Summary  Contraception, also called birth control, means things to use or ways to try not to get pregnant.  Hormonal methods of birth control include implants, injections, pills, patches, vaginal rings, and emergency birth control pills.  Barrier methods of birth control can include female condoms,  female condoms, diaphragms, cervical caps, sponges, and spermicides.  There are two types of IUD (intrauterine device) birth control. An IUD can be put in a woman's uterus to prevent pregnancy for 3-5 years.  Permanent sterilization can be done through a procedure for males, females, or both.  Natural planning methods involve not having sex on the days when the woman could get pregnant. This information is not intended to replace advice given to you by your health care provider. Make sure you discuss any questions you have with your health care provider. Document Released: 12/12/2008 Document Revised: 02/25/2016 Document Reviewed: 02/25/2016 Elsevier Interactive Patient Education  2017 [employer].

## 2016-06-08 NOTE — Progress Notes (Signed)
   PRENATAL VISIT NOTE  Subjective:  Kathy Sandoval is a 31 y.o. G3P2002 at [redacted]w[redacted]d being seen today for ongoing prenatal care.  She is currently monitored for the following issues for this high-risk pregnancy and has MIGRAINE, UNSPEC., W/O INTRACTABLE MIGRAINE; Obesity; Type 2 diabetes mellitus in pregnancy; Supervision of high risk pregnancy in first trimester; Dichorionic diamniotic twin pregnancy, antepartum; GBS bacteriuria; Rubella non-immune status, antepartum; and Abnormal quad screen on her problem list.  Patient reports Patient complaing of some constipation. She reports 1-2 BM a week occasionally alternating with loose and hard stool.  Contractions: Irritability. Vag. Bleeding: None.  Movement: Present. Denies leaking of fluid.   The following portions of the patient's history were reviewed and updated as appropriate: allergies, current medications, past family history, past medical history, past social history, past surgical history and problem list. Problem list updated.  Objective:   Vitals:   06/08/16 0916  BP: 111/67  Pulse: (!) 109  Weight: 248 lb 14.4 oz (112.9 kg)    Fetal Status: Fetal Heart Rate (bpm): 160/156   Movement: Present     General:  Alert, oriented and cooperative. Patient is in no acute distress.  Skin: Skin is warm and dry. No rash noted.   Cardiovascular: Normal heart rate noted  Respiratory: Normal respiratory effort, no problems with respiration noted  Abdomen: Soft, gravid, appropriate for gestational age. Pain/Pressure: Present     Pelvic:  Cervical exam deferred        Extremities: Normal range of motion.     Mental Status: Normal mood and affect. Normal behavior. Normal judgment and thought content.   Assessment and Plan:  Pregnancy: G3P2002 at [redacted]w[redacted]d  1. Supervision of high risk pregnancy, antepartum - HIV antibody (with reflex) - CBC - RPR - Di/Di twins - growth scan scheduled -considering tubal - sign at next visit. -colace BID for  constipation   2. Need for Tdap vaccination - Tdap vaccine greater than or equal to 7yo IM  3. GDMB -continue metformin/glyburide. -patient does not have log but reports 80's fasting in AM and 110's and 120's 2 hours post prandial.  Preterm labor symptoms and general obstetric precautions including but not limited to vaginal bleeding, contractions, leaking of fluid and fetal movement were reviewed in detail with the patient. Please refer to After Visit Summary for other counseling recommendations.  No Follow-up on file.   Lorne Skeens, MD

## 2016-06-09 LAB — CBC
HEMATOCRIT: 34.4 % (ref 34.0–46.6)
HEMOGLOBIN: 11.5 g/dL (ref 11.1–15.9)
MCH: 28.9 pg (ref 26.6–33.0)
MCHC: 33.4 g/dL (ref 31.5–35.7)
MCV: 86 fL (ref 79–97)
Platelets: 313 10*3/uL (ref 150–379)
RBC: 3.98 x10E6/uL (ref 3.77–5.28)
RDW: 14.3 % (ref 12.3–15.4)
WBC: 9.9 10*3/uL (ref 3.4–10.8)

## 2016-06-09 LAB — RPR: RPR: NONREACTIVE

## 2016-06-09 LAB — HIV ANTIBODY (ROUTINE TESTING W REFLEX): HIV SCREEN 4TH GENERATION: NONREACTIVE

## 2016-06-14 ENCOUNTER — Other Ambulatory Visit (HOSPITAL_COMMUNITY): Payer: Self-pay | Admitting: *Deleted

## 2016-06-14 ENCOUNTER — Encounter (HOSPITAL_COMMUNITY): Payer: Self-pay

## 2016-06-14 ENCOUNTER — Ambulatory Visit (HOSPITAL_COMMUNITY)
Admission: RE | Admit: 2016-06-14 | Discharge: 2016-06-14 | Disposition: A | Discharge status: Home / Self Care | Payer: Medicaid Other | Source: Ambulatory Visit | Attending: Obstetrics and Gynecology | Admitting: Obstetrics and Gynecology

## 2016-06-14 DIAGNOSIS — O30042 Twin pregnancy, dichorionic/diamniotic, second trimester: Secondary | ICD-10-CM | POA: Insufficient documentation

## 2016-06-14 DIAGNOSIS — O289 Unspecified abnormal findings on antenatal screening of mother: Secondary | ICD-10-CM | POA: Diagnosis not present

## 2016-06-14 DIAGNOSIS — O30049 Twin pregnancy, dichorionic/diamniotic, unspecified trimester: Secondary | ICD-10-CM

## 2016-06-14 DIAGNOSIS — Z3A26 26 weeks gestation of pregnancy: Secondary | ICD-10-CM | POA: Insufficient documentation

## 2016-06-14 DIAGNOSIS — O99212 Obesity complicating pregnancy, second trimester: Secondary | ICD-10-CM | POA: Insufficient documentation

## 2016-06-14 DIAGNOSIS — O24112 Pre-existing diabetes mellitus, type 2, in pregnancy, second trimester: Secondary | ICD-10-CM | POA: Insufficient documentation

## 2016-06-22 ENCOUNTER — Other Ambulatory Visit: Payer: Self-pay

## 2016-06-22 MED ORDER — GLYBURIDE 2.5 MG PO TABS: 2.5000 mg | ORAL_TABLET | Freq: Two times a day (BID) | ORAL | 3 refills | Status: DC

## 2016-06-22 NOTE — Telephone Encounter (Signed)
Refill on glyburide

## 2016-07-01 ENCOUNTER — Ambulatory Visit: Payer: Medicaid Other | Admitting: Internal Medicine

## 2016-07-05 ENCOUNTER — Telehealth: Payer: Self-pay | Admitting: *Deleted

## 2016-07-05 DIAGNOSIS — E119 Type 2 diabetes mellitus without complications: Secondary | ICD-10-CM

## 2016-07-05 MED ORDER — METFORMIN HCL 500 MG PO TABS: 500.0000 mg | ORAL_TABLET | Freq: Two times a day (BID) | ORAL | 11 refills | Status: AC

## 2016-07-05 NOTE — Telephone Encounter (Signed)
Patient left message. Her prescription for metformin is expired and she is completely out. [facility] [address]

## 2016-07-05 NOTE — Telephone Encounter (Signed)
Patient called again to front dest re: metformin rx. Prescription sent to pharmacy, patient aware.

## 2016-07-06 ENCOUNTER — Ambulatory Visit (HOSPITAL_COMMUNITY)
Admission: RE | Admit: 2016-07-06 | Discharge: 2016-07-06 | Disposition: A | Discharge status: Home / Self Care | Payer: Medicaid Other | Source: Ambulatory Visit | Attending: Obstetrics and Gynecology | Admitting: Obstetrics and Gynecology

## 2016-07-06 ENCOUNTER — Ambulatory Visit (INDEPENDENT_AMBULATORY_CARE_PROVIDER_SITE_OTHER): Payer: Medicaid Other | Admitting: Obstetrics and Gynecology

## 2016-07-06 ENCOUNTER — Other Ambulatory Visit: Payer: Self-pay | Admitting: Obstetrics and Gynecology

## 2016-07-06 VITALS — BP 119/73 | HR 107 | Wt 252.4 lb

## 2016-07-06 DIAGNOSIS — O368130 Decreased fetal movements, third trimester, not applicable or unspecified: Secondary | ICD-10-CM

## 2016-07-06 DIAGNOSIS — O30043 Twin pregnancy, dichorionic/diamniotic, third trimester: Secondary | ICD-10-CM | POA: Diagnosis not present

## 2016-07-06 DIAGNOSIS — Z3A29 29 weeks gestation of pregnancy: Secondary | ICD-10-CM | POA: Insufficient documentation

## 2016-07-06 DIAGNOSIS — O368131 Decreased fetal movements, third trimester, fetus 1: Secondary | ICD-10-CM

## 2016-07-06 DIAGNOSIS — O0991 Supervision of high risk pregnancy, unspecified, first trimester: Secondary | ICD-10-CM

## 2016-07-06 DIAGNOSIS — O0993 Supervision of high risk pregnancy, unspecified, third trimester: Secondary | ICD-10-CM | POA: Diagnosis not present

## 2016-07-06 DIAGNOSIS — O24113 Pre-existing diabetes mellitus, type 2, in pregnancy, third trimester: Secondary | ICD-10-CM | POA: Diagnosis not present

## 2016-07-06 DIAGNOSIS — O368132 Decreased fetal movements, third trimester, fetus 2: Secondary | ICD-10-CM

## 2016-07-06 DIAGNOSIS — O24119 Pre-existing diabetes mellitus, type 2, in pregnancy, unspecified trimester: Secondary | ICD-10-CM

## 2016-07-06 LAB — POCT URINALYSIS DIP (DEVICE)
BILIRUBIN URINE: NEGATIVE
Glucose, UA: NEGATIVE mg/dL
Hgb urine dipstick: NEGATIVE
Ketones, ur: 15 mg/dL — AB
Leukocytes, UA: NEGATIVE
Nitrite: NEGATIVE
PH: 6 (ref 5.0–8.0)
PROTEIN: NEGATIVE mg/dL
Specific Gravity, Urine: 1.015 (ref 1.005–1.030)
Urobilinogen, UA: 0.2 mg/dL (ref 0.0–1.0)

## 2016-07-06 NOTE — Patient Instructions (Signed)
Postpartum Tubal Ligation Postpartum tubal ligation (PPTL) is a procedure to close the fallopian tubes. This is done so that you cannot get pregnant. When the fallopian tubes are closed, the eggs that the ovaries release cannot enter the uterus, and sperm cannot reach the eggs. PPTL is done right after childbirth or 1-2 days after childbirth, before the uterus returns to its normal location. PPTL is sometimes called "getting your tubes tied." You should not have this procedure if you want to get pregnant someday or if you are unsure about having more children. Tell a health care provider about:  Any allergies you have.  All medicines you are taking, including vitamins, herbs, eye drops, creams, and over-the-counter medicines.  Previous problems you or members of your family have had with the use of anesthetics.  Any blood disorders you have.  Previous surgeries you have had.  Any medical conditions you may have.  Any past pregnancies. What are the risks? Generally, this is a safe procedure. However, problems may occur, including:  Infection.  Bleeding.  Injury to surrounding organs.  Side effects from anesthetics.  Failure of the procedure. This procedure can increase your risk of a kind of pregnancy in which a fertilized egg attaches to the outside of the uterus (ectopic pregnancy). What happens before the procedure?  Ask your health care provider about:  How much pain you can expect to have.  What medicines you will be given for pain, especially if you are planning to breastfeed.  Follow instructions from your health care provider about eating and drinking restrictions. What happens during the procedure? If you had a vaginal delivery:  You may be given one or more of the following:  A medicine that helps you relax (sedative).  A medicine to numb the area (local anesthetic).  A medicine to make you fall asleep (general anesthetic).  A medicine that is injected into  an area of your body to numb everything below the injection site (regional anesthetic).  If you have been given a general anesthetic, a tube will be put down your throat to help you breathe.  An IV tube will be inserted into one of your veins to give you medicines and fluids during the procedure.  Your bladder may be emptied with a small tube (catheter).  An incision will be made just below your belly button.  Your fallopian tubes will be located and brought up through the incision.  Your fallopian tubes will be tied off, burned (cauterized), or blocked with a clip, ring, or clamp. A small portion in the center of each fallopian tube may be removed.  The incision will be closed with stitches (sutures).  A bandage (dressing) will be placed over the incision. If you had a cesarean delivery:  Tubal ligation will be done through the incision that was used for the cesarean delivery of your baby.  The incision will be closed with sutures.  A dressing will be placed over the incision. The procedure may vary among health care providers and hospitals. What happens after the procedure?  Your blood pressure, heart rate, breathing rate, and blood oxygen level will be monitored often until the medicines you were given have worn off.  You will be given pain medicine as needed.  Do not drive for 24 hours if you received a sedative. This information is not intended to replace advice given to you by your health care provider. Make sure you discuss any questions you have with your health care provider. Document   Released: 02/14/2005 Document Revised: 07/20/2015 Document Reviewed: 01/25/2015 Elsevier Interactive Patient Education  2017 [employer] Inc[employer]  

## 2016-07-06 NOTE — Progress Notes (Signed)
Decrease movement in second baby

## 2016-07-06 NOTE — Progress Notes (Signed)
   PRENATAL VISIT NOTE  Subjective:  Kathy Sandoval is a 31 y.o. G3P2002 at 5573w6d being seen today for ongoing prenatal care.  She is currently monitored for the following issues for this high-risk pregnancy and has MIGRAINE, UNSPEC., W/O INTRACTABLE MIGRAINE; Obesity; Type 2 diabetes mellitus in pregnancy; Supervision of high risk pregnancy in first trimester; Dichorionic diamniotic twin pregnancy, antepartum; GBS bacteriuria; Rubella non-immune status, antepartum; and Abnormal quad screen on her problem list.  Patient reports Patient reports for the past 24 she thinks she has felt less movement in twin A. She is not sure if she is feeling movment or not..  Contractions: Irritability.  .  Movement: Present. Denies leaking of fluid.   The following portions of the patient's history were reviewed and updated as appropriate: allergies, current medications, past family history, past medical history, past social history, past surgical history and problem list. Problem list updated.  Objective:   Vitals:   07/06/16 1039  BP: 119/73  Pulse: (!) 107  Weight: 252 lb 6.4 oz (114.5 kg)    Fetal Status: Fetal Heart Rate (bpm): 160/162   Movement: Present     General:  Alert, oriented and cooperative. Patient is in no acute distress.  Skin: Skin is warm and dry. No rash noted.   Cardiovascular: Normal heart rate noted  Respiratory: Normal respiratory effort, no problems with respiration noted  Abdomen: Soft, gravid, appropriate for gestational age. Pain/Pressure: Present     Pelvic:  Cervical exam deferred        Extremities: Normal range of motion.     Mental Status: Normal mood and affect. Normal behavior. Normal judgment and thought content.   Assessment and Plan:  Pregnancy: G3P2002 at 6773w6d  1. Decreased fetal movements in third trimester, fetus 1 of multiple gestation -Patient concerned for decreased fetal movement in Twin A. FHTS obtained today on both infants. BPP ordered as patient  should be getting weekly BPP's at this ponit any way. MFM will work in.  2. Supervision of high risk pregnancy in first trimester Up to date on screening. Tubal papers signed today  3. Type 2 diabetes mellitus during pregnancy, antepartum Sugars all at goal, continue current regimen - growth scans are scheduled.  Preterm labor symptoms and general obstetric precautions including but not limited to vaginal bleeding, contractions, leaking of fluid and fetal movement were reviewed in detail with the patient. Please refer to After Visit Summary for other counseling recommendations.  Return in about 2 weeks (around 07/20/2016) for HROB.   Lorne SkeensSchenk, Montrell Cessna Michael, MD

## 2016-07-07 ENCOUNTER — Ambulatory Visit (HOSPITAL_COMMUNITY): Payer: Medicaid Other

## 2016-07-08 ENCOUNTER — Ambulatory Visit (HOSPITAL_COMMUNITY): Payer: Medicaid Other

## 2016-07-12 ENCOUNTER — Encounter: Payer: Self-pay | Admitting: *Deleted

## 2016-07-12 ENCOUNTER — Ambulatory Visit (HOSPITAL_COMMUNITY)
Admission: RE | Admit: 2016-07-12 | Discharge: 2016-07-12 | Disposition: A | Discharge status: Home / Self Care | Payer: Medicaid Other | Source: Ambulatory Visit | Attending: Obstetrics and Gynecology | Admitting: Obstetrics and Gynecology

## 2016-07-12 ENCOUNTER — Other Ambulatory Visit (HOSPITAL_COMMUNITY): Payer: Self-pay | Admitting: Maternal and Fetal Medicine

## 2016-07-12 ENCOUNTER — Encounter (HOSPITAL_COMMUNITY): Payer: Self-pay

## 2016-07-12 DIAGNOSIS — O99213 Obesity complicating pregnancy, third trimester: Secondary | ICD-10-CM | POA: Diagnosis not present

## 2016-07-12 DIAGNOSIS — Z3A30 30 weeks gestation of pregnancy: Secondary | ICD-10-CM | POA: Insufficient documentation

## 2016-07-12 DIAGNOSIS — O24913 Unspecified diabetes mellitus in pregnancy, third trimester: Secondary | ICD-10-CM

## 2016-07-12 DIAGNOSIS — O24113 Pre-existing diabetes mellitus, type 2, in pregnancy, third trimester: Secondary | ICD-10-CM | POA: Insufficient documentation

## 2016-07-12 DIAGNOSIS — E669 Obesity, unspecified: Secondary | ICD-10-CM | POA: Diagnosis not present

## 2016-07-12 DIAGNOSIS — O321XX1 Maternal care for breech presentation, fetus 1: Secondary | ICD-10-CM | POA: Insufficient documentation

## 2016-07-12 DIAGNOSIS — O281 Abnormal biochemical finding on antenatal screening of mother: Secondary | ICD-10-CM

## 2016-07-12 DIAGNOSIS — O289 Unspecified abnormal findings on antenatal screening of mother: Secondary | ICD-10-CM | POA: Diagnosis not present

## 2016-07-12 DIAGNOSIS — O30049 Twin pregnancy, dichorionic/diamniotic, unspecified trimester: Secondary | ICD-10-CM

## 2016-07-12 DIAGNOSIS — O30043 Twin pregnancy, dichorionic/diamniotic, third trimester: Secondary | ICD-10-CM | POA: Diagnosis present

## 2016-07-15 ENCOUNTER — Telehealth: Payer: Self-pay | Admitting: Obstetrics & Gynecology

## 2016-07-15 NOTE — Telephone Encounter (Signed)
Attempted to call patient in regards to a bruise on her abdomen. There was no answer. Left voice mail stating I am returning her call. She can return my call at the clinic.

## 2016-07-15 NOTE — Telephone Encounter (Signed)
Patient called and stated her husband saw a blue bruise on her stomach. She stated it was not painful, but wanted to speak to a nurse about it.

## 2016-07-18 ENCOUNTER — Encounter: Payer: Self-pay | Admitting: Obstetrics & Gynecology

## 2016-07-18 ENCOUNTER — Encounter: Payer: Self-pay | Admitting: Obstetrics and Gynecology

## 2016-07-28 ENCOUNTER — Ambulatory Visit (INDEPENDENT_AMBULATORY_CARE_PROVIDER_SITE_OTHER): Payer: Medicaid Other | Admitting: Obstetrics & Gynecology

## 2016-07-28 VITALS — BP 101/65 | HR 108 | Wt 255.2 lb

## 2016-07-28 DIAGNOSIS — O30043 Twin pregnancy, dichorionic/diamniotic, third trimester: Secondary | ICD-10-CM

## 2016-07-28 DIAGNOSIS — O24119 Pre-existing diabetes mellitus, type 2, in pregnancy, unspecified trimester: Secondary | ICD-10-CM

## 2016-07-28 DIAGNOSIS — O0993 Supervision of high risk pregnancy, unspecified, third trimester: Secondary | ICD-10-CM

## 2016-07-28 DIAGNOSIS — O0991 Supervision of high risk pregnancy, unspecified, first trimester: Secondary | ICD-10-CM

## 2016-07-28 DIAGNOSIS — O24113 Pre-existing diabetes mellitus, type 2, in pregnancy, third trimester: Secondary | ICD-10-CM

## 2016-07-28 DIAGNOSIS — O30049 Twin pregnancy, dichorionic/diamniotic, unspecified trimester: Secondary | ICD-10-CM

## 2016-07-28 LAB — POCT URINALYSIS DIP (DEVICE)
Bilirubin Urine: NEGATIVE
GLUCOSE, UA: NEGATIVE mg/dL
Hgb urine dipstick: NEGATIVE
Ketones, ur: 15 mg/dL — AB
NITRITE: NEGATIVE
Protein, ur: NEGATIVE mg/dL
Specific Gravity, Urine: 1.015 (ref 1.005–1.030)
UROBILINOGEN UA: 0.2 mg/dL (ref 0.0–1.0)
pH: 7 (ref 5.0–8.0)

## 2016-07-28 NOTE — Progress Notes (Signed)
Patient ID: Kathy SakaiVeronica Sandoval, female    DOB: 01/06/1984, 31 y.o.   MRN: [id number]  HPI  Kathy Sandoval is a 31 yo female who presents for prenatal follow up visit.  She is 3515w0d with twins and is currently taking metformin and glyburide for type 2 DM.  She reports that she stopped taking the glyburide at night and is now taking it only twice per day at lunch and at dinner.  Her fasting BG levels are between 60-95 and her postprandial BG average around 110.  She admits to intermittent abdominal "tightness," denies any vaginal discharge or bleeding, swelling, headaches or change in vision and has not been to see an opthamologist.    Review of Systems  Eyes: Negative.  no changes in vision Genitourinary: Negative for vaginal bleeding, vaginal discharge and vaginal pain.       Objective:   Physical Exam  Constitutional: She is oriented to person, place, and time. She appears well-developed and well-nourished.  Genitourinary: Cervix exhibits no motion tenderness, no discharge and no friability. No bleeding in the vagina. No vaginal discharge found. Cervix is closed/long/posterior/with tone Neurological: She is alert and oriented to person, place, and time.  Abd:  Gavid, nontender, fundal height 50--large pannus and twins. Resp:  nml air mvmt.                Assessment & Plan:  Type 2 diabetes mellitus during pregnancy, antepartum  Instructed patient to continue to monitor blood glucose levels and take her glyburide in the morning and at dinner -Advised her to schedule appt with opthamologist -Start 2x weekly testing -Serial growth US -Optho appt -Pt reports fetal echo is nml (reprot was reviewed with her last visit)  Dichorionic diamniotic twin pregnancy, antepartum  -Advised patient to return to office for schedule US follow up  -serial growth US  Kathy LewandowskyBethany Oshua Mcconaha, PA-S  I confirm that I have verified the information documented in the PA student's note and that I have also  personally performed/reperformed the physical exam and all medical decision making activities.

## 2016-08-01 ENCOUNTER — Other Ambulatory Visit: Payer: Medicaid Other

## 2016-08-01 ENCOUNTER — Telehealth: Payer: Self-pay | Admitting: *Deleted

## 2016-08-01 NOTE — Telephone Encounter (Addendum)
Called pt regarding missed appt today @ 1030 for NST.  I left a message on her personal voice mail stating that I was checking to be sure she is ok since she missed appt and that the babies are moving well. I stated that there are open appts for later today if she would like to reschedule. Next appt is scheduled on 6/7. Please call back and let us know if you will not be able to come in today and verify that you are ok.   6/7  Pt came to scheduled appt today and had reactive NST x2.

## 2016-08-04 ENCOUNTER — Ambulatory Visit (INDEPENDENT_AMBULATORY_CARE_PROVIDER_SITE_OTHER): Payer: Medicaid Other | Admitting: *Deleted

## 2016-08-04 VITALS — BP 126/84 | HR 98

## 2016-08-04 DIAGNOSIS — O30043 Twin pregnancy, dichorionic/diamniotic, third trimester: Secondary | ICD-10-CM

## 2016-08-04 DIAGNOSIS — O24119 Pre-existing diabetes mellitus, type 2, in pregnancy, unspecified trimester: Secondary | ICD-10-CM

## 2016-08-04 DIAGNOSIS — O30049 Twin pregnancy, dichorionic/diamniotic, unspecified trimester: Secondary | ICD-10-CM

## 2016-08-04 NOTE — Progress Notes (Signed)
US for growth on 6/12 - BPP added

## 2016-08-04 NOTE — Progress Notes (Signed)
  NST: A Baseline: 145 bpm, Variability: Good {> 6 bpm), Accelerations: Reactive and Decelerations: Absent  NST: B Baseline: 150 bpm, Variability: Good {> 6 bpm), Accelerations: Reactive and Decelerations: Absent

## 2016-08-09 ENCOUNTER — Ambulatory Visit (HOSPITAL_COMMUNITY)
Admission: RE | Admit: 2016-08-09 | Discharge: 2016-08-09 | Disposition: A | Discharge status: Home / Self Care | Payer: Medicaid Other | Source: Ambulatory Visit | Attending: Obstetrics and Gynecology | Admitting: Obstetrics and Gynecology

## 2016-08-09 ENCOUNTER — Encounter: Payer: Medicaid Other | Admitting: Family

## 2016-08-09 ENCOUNTER — Encounter (HOSPITAL_COMMUNITY): Payer: Self-pay

## 2016-08-09 ENCOUNTER — Ambulatory Visit (INDEPENDENT_AMBULATORY_CARE_PROVIDER_SITE_OTHER): Payer: Medicaid Other | Admitting: Family

## 2016-08-09 VITALS — BP 121/79 | HR 82 | Wt 260.8 lb

## 2016-08-09 DIAGNOSIS — O30043 Twin pregnancy, dichorionic/diamniotic, third trimester: Secondary | ICD-10-CM

## 2016-08-09 DIAGNOSIS — O0993 Supervision of high risk pregnancy, unspecified, third trimester: Secondary | ICD-10-CM

## 2016-08-09 DIAGNOSIS — O30049 Twin pregnancy, dichorionic/diamniotic, unspecified trimester: Secondary | ICD-10-CM

## 2016-08-09 DIAGNOSIS — O24113 Pre-existing diabetes mellitus, type 2, in pregnancy, third trimester: Secondary | ICD-10-CM | POA: Diagnosis not present

## 2016-08-09 DIAGNOSIS — O24112 Pre-existing diabetes mellitus, type 2, in pregnancy, second trimester: Secondary | ICD-10-CM

## 2016-08-09 DIAGNOSIS — O0991 Supervision of high risk pregnancy, unspecified, first trimester: Secondary | ICD-10-CM

## 2016-08-09 LAB — POCT URINALYSIS DIP (DEVICE)
GLUCOSE, UA: NEGATIVE mg/dL
Hgb urine dipstick: NEGATIVE
KETONES UR: NEGATIVE mg/dL
Nitrite: NEGATIVE
Protein, ur: 100 mg/dL — AB
SPECIFIC GRAVITY, URINE: 1.02 (ref 1.005–1.030)
Urobilinogen, UA: 1 mg/dL (ref 0.0–1.0)
pH: 6 (ref 5.0–8.0)

## 2016-08-09 NOTE — Progress Notes (Signed)
   PRENATAL VISIT NOTE  Subjective:  Danella Deisorma Marcus is a 31 y.o. G3P2002 at 6410w5d being seen today for ongoing prenatal care.  She is currently monitored for the following issues for this high-risk pregnancy and has MIGRAINE, UNSPEC., W/O INTRACTABLE MIGRAINE; Obesity; Type 2 diabetes mellitus in pregnancy; Supervision of high risk pregnancy in first trimester; Dichorionic diamniotic twin pregnancy, antepartum; GBS bacteriuria; Rubella non-immune status, antepartum; and Abnormal quad screen on her problem list.  Patient reports occasional contractions.   Irregular contractions, not feeling like need to time.  Increased pressure.   Contractions: Irregular. Vag. Bleeding: None.  Movement: Present. Denies leaking of fluid.   The following portions of the patient's history were reviewed and updated as appropriate: allergies, current medications, past family history, past medical history, past social history, past surgical history and problem list. Problem list updated.  Objective:   Vitals:   08/09/16 1011  BP: 121/79  Pulse: 82  Weight: 260 lb 12.8 oz (118.3 kg)    Fetal Status: Fetal Heart Rate (bpm): 155/148   Movement: Present     General:  Alert, oriented and cooperative. Patient is in no acute distress.  Skin: Skin is warm and dry. No rash noted.   Cardiovascular: Normal heart rate noted  Respiratory: Normal respiratory effort, no problems with respiration noted  Abdomen: Soft, gravid, appropriate for gestational age. Pain/Pressure: Present     Pelvic:  Cervical exam performed Dilation: Fingertip Effacement (%): Thick    Extremities: Normal range of motion.  Edema: Trace  Mental Status: Normal mood and affect. Normal behavior. Normal judgment and thought content.   Assessment and Plan:  Pregnancy: G3P2002 at 5710w5d  1. Pregnancy with type 2 diabetes mellitus in second trimester - Did not bring log; reports values wnl - Growth US and BPP today  2. Dichorionic diamniotic twin  pregnancy, antepartum - Desires vaginal delivery if vertex presentation - Last growth US wnl - Repeat growth today  3. Supervision of high risk pregnancy in first trimester - Reviewed labor precautions  Preterm labor symptoms and general obstetric precautions including but not limited to vaginal bleeding, contractions, leaking of fluid and fetal movement were reviewed in detail with the patient. Please refer to After Visit Summary for other counseling recommendations.  Return in about 1 week (around 08/16/2016) for appt and NST.   Rochele PagesWalidah Karim, CNM

## 2016-08-10 ENCOUNTER — Observation Stay (HOSPITAL_COMMUNITY): Payer: Medicaid Other | Admitting: Anesthesiology

## 2016-08-10 ENCOUNTER — Encounter (HOSPITAL_COMMUNITY)
Admission: AD | Disposition: A | Discharge status: Home / Self Care | Payer: Self-pay | Source: Ambulatory Visit | Attending: Obstetrics & Gynecology

## 2016-08-10 ENCOUNTER — Encounter (HOSPITAL_COMMUNITY): Payer: Self-pay | Admitting: *Deleted

## 2016-08-10 ENCOUNTER — Inpatient Hospital Stay (HOSPITAL_COMMUNITY)
Admission: AD | Admit: 2016-08-10 | Discharge: 2016-08-14 | DRG: 765 | Disposition: A | Discharge status: Home / Self Care | Payer: Medicaid Other | Source: Ambulatory Visit | Attending: Obstetrics & Gynecology | Admitting: Obstetrics & Gynecology

## 2016-08-10 ENCOUNTER — Encounter (HOSPITAL_COMMUNITY): Payer: Self-pay

## 2016-08-10 ENCOUNTER — Other Ambulatory Visit (HOSPITAL_COMMUNITY): Payer: Self-pay | Admitting: Maternal and Fetal Medicine

## 2016-08-10 ENCOUNTER — Ambulatory Visit (HOSPITAL_COMMUNITY)
Admission: RE | Admit: 2016-08-10 | Discharge: 2016-08-10 | Disposition: A | Discharge status: Home / Self Care | Payer: Medicaid Other | Source: Ambulatory Visit | Attending: Obstetrics and Gynecology | Admitting: Obstetrics and Gynecology

## 2016-08-10 DIAGNOSIS — Z3A34 34 weeks gestation of pregnancy: Secondary | ICD-10-CM

## 2016-08-10 DIAGNOSIS — O368390 Maternal care for abnormalities of the fetal heart rate or rhythm, unspecified trimester, not applicable or unspecified: Secondary | ICD-10-CM | POA: Diagnosis not present

## 2016-08-10 DIAGNOSIS — Z7984 Long term (current) use of oral hypoglycemic drugs: Secondary | ICD-10-CM | POA: Diagnosis not present

## 2016-08-10 DIAGNOSIS — D649 Anemia, unspecified: Secondary | ICD-10-CM | POA: Diagnosis present

## 2016-08-10 DIAGNOSIS — O4593 Premature separation of placenta, unspecified, third trimester: Secondary | ICD-10-CM

## 2016-08-10 DIAGNOSIS — O9902 Anemia complicating childbirth: Secondary | ICD-10-CM | POA: Diagnosis present

## 2016-08-10 DIAGNOSIS — O289 Unspecified abnormal findings on antenatal screening of mother: Secondary | ICD-10-CM

## 2016-08-10 DIAGNOSIS — E119 Type 2 diabetes mellitus without complications: Secondary | ICD-10-CM | POA: Diagnosis present

## 2016-08-10 DIAGNOSIS — Z362 Encounter for other antenatal screening follow-up: Secondary | ICD-10-CM

## 2016-08-10 DIAGNOSIS — O368132 Decreased fetal movements, third trimester, fetus 2: Secondary | ICD-10-CM | POA: Diagnosis present

## 2016-08-10 DIAGNOSIS — O30043 Twin pregnancy, dichorionic/diamniotic, third trimester: Secondary | ICD-10-CM | POA: Diagnosis present

## 2016-08-10 DIAGNOSIS — Z6841 Body Mass Index (BMI) 40.0 and over, adult: Secondary | ICD-10-CM

## 2016-08-10 DIAGNOSIS — O30049 Twin pregnancy, dichorionic/diamniotic, unspecified trimester: Secondary | ICD-10-CM

## 2016-08-10 DIAGNOSIS — O99213 Obesity complicating pregnancy, third trimester: Secondary | ICD-10-CM

## 2016-08-10 DIAGNOSIS — Z7982 Long term (current) use of aspirin: Secondary | ICD-10-CM | POA: Diagnosis not present

## 2016-08-10 DIAGNOSIS — O24119 Pre-existing diabetes mellitus, type 2, in pregnancy, unspecified trimester: Secondary | ICD-10-CM

## 2016-08-10 DIAGNOSIS — O134 Gestational [pregnancy-induced] hypertension without significant proteinuria, complicating childbirth: Secondary | ICD-10-CM | POA: Diagnosis present

## 2016-08-10 DIAGNOSIS — O368131 Decreased fetal movements, third trimester, fetus 1: Secondary | ICD-10-CM | POA: Diagnosis present

## 2016-08-10 DIAGNOSIS — O24913 Unspecified diabetes mellitus in pregnancy, third trimester: Secondary | ICD-10-CM

## 2016-08-10 DIAGNOSIS — O30009 Twin pregnancy, unspecified number of placenta and unspecified number of amniotic sacs, unspecified trimester: Secondary | ICD-10-CM

## 2016-08-10 DIAGNOSIS — O2412 Pre-existing diabetes mellitus, type 2, in childbirth: Secondary | ICD-10-CM | POA: Diagnosis present

## 2016-08-10 DIAGNOSIS — Z283 Underimmunization status: Secondary | ICD-10-CM

## 2016-08-10 DIAGNOSIS — Z87891 Personal history of nicotine dependence: Secondary | ICD-10-CM

## 2016-08-10 DIAGNOSIS — O9989 Other specified diseases and conditions complicating pregnancy, childbirth and the puerperium: Secondary | ICD-10-CM

## 2016-08-10 DIAGNOSIS — O99214 Obesity complicating childbirth: Secondary | ICD-10-CM | POA: Diagnosis present

## 2016-08-10 DIAGNOSIS — O165 Unspecified maternal hypertension, complicating the puerperium: Secondary | ICD-10-CM | POA: Diagnosis not present

## 2016-08-10 DIAGNOSIS — Z98891 History of uterine scar from previous surgery: Secondary | ICD-10-CM

## 2016-08-10 DIAGNOSIS — R8271 Bacteriuria: Secondary | ICD-10-CM

## 2016-08-10 DIAGNOSIS — O09899 Supervision of other high risk pregnancies, unspecified trimester: Secondary | ICD-10-CM

## 2016-08-10 LAB — FIBRINOGEN: Fibrinogen: 398 mg/dL (ref 210–475)

## 2016-08-10 LAB — CBC
HCT: 30.2 % — ABNORMAL LOW (ref 36.0–46.0)
HCT: 25.3 % — ABNORMAL LOW (ref 36.0–46.0)
HCT: 25.9 % — ABNORMAL LOW (ref 36.0–46.0)
Hemoglobin: 9.8 g/dL — ABNORMAL LOW (ref 12.0–15.0)
Hemoglobin: 8.5 g/dL — ABNORMAL LOW (ref 12.0–15.0)
Hemoglobin: 8.5 g/dL — ABNORMAL LOW (ref 12.0–15.0)
MCH: 27 pg (ref 26.0–34.0)
MCH: 27.4 pg (ref 26.0–34.0)
MCH: 28.1 pg (ref 26.0–34.0)
MCHC: 32.5 g/dL (ref 30.0–36.0)
MCHC: 32.8 g/dL (ref 30.0–36.0)
MCHC: 33.6 g/dL (ref 30.0–36.0)
MCV: 83.2 fL (ref 78.0–100.0)
MCV: 83.5 fL (ref 78.0–100.0)
MCV: 83.8 fL (ref 78.0–100.0)
PLATELETS: 182 10*3/uL (ref 150–400)
Platelets: 203 K/uL (ref 150–400)
Platelets: 178 10*3/uL (ref 150–400)
RBC: 3.63 MIL/uL — ABNORMAL LOW (ref 3.87–5.11)
RBC: 3.02 MIL/uL — ABNORMAL LOW (ref 3.87–5.11)
RBC: 3.1 MIL/uL — ABNORMAL LOW (ref 3.87–5.11)
RDW: 14.4 % (ref 11.5–15.5)
RDW: 14.4 % (ref 11.5–15.5)
RDW: 14.4 % (ref 11.5–15.5)
WBC: 10.6 K/uL — ABNORMAL HIGH (ref 4.0–10.5)
WBC: 13.8 10*3/uL — ABNORMAL HIGH (ref 4.0–10.5)
WBC: 15.2 10*3/uL — AB (ref 4.0–10.5)

## 2016-08-10 LAB — COMPREHENSIVE METABOLIC PANEL WITH GFR
ALT: 10 U/L — ABNORMAL LOW (ref 14–54)
AST: 16 U/L (ref 15–41)
Albumin: 2.5 g/dL — ABNORMAL LOW (ref 3.5–5.0)
Alkaline Phosphatase: 196 U/L — ABNORMAL HIGH (ref 38–126)
Anion gap: 9 (ref 5–15)
BUN: 10 mg/dL (ref 6–20)
CO2: 21 mmol/L — ABNORMAL LOW (ref 22–32)
Calcium: 8.7 mg/dL — ABNORMAL LOW (ref 8.9–10.3)
Chloride: 106 mmol/L (ref 101–111)
Creatinine, Ser: 0.61 mg/dL (ref 0.44–1.00)
GFR calc Af Amer: >60 mL/min
GFR calc non Af Amer: >60 mL/min
Glucose, Bld: 68 mg/dL (ref 65–99)
Potassium: 4.2 mmol/L (ref 3.5–5.1)
Sodium: 136 mmol/L (ref 135–145)
Total Bilirubin: 0.5 mg/dL (ref 0.3–1.2)
Total Protein: 6.4 g/dL — ABNORMAL LOW (ref 6.5–8.1)

## 2016-08-10 LAB — PREPARE RBC (CROSSMATCH)

## 2016-08-10 LAB — D-DIMER, QUANTITATIVE: D-Dimer, Quant: 15.68 ug/mL-FEU — ABNORMAL HIGH (ref 0.00–0.50)

## 2016-08-10 LAB — PROTIME-INR
INR: 1.03
Prothrombin Time: 13.5 seconds (ref 11.4–15.2)

## 2016-08-10 LAB — ABO/RH: ABO/RH(D): O POS

## 2016-08-10 LAB — SAVE SMEAR

## 2016-08-10 LAB — APTT: APTT: 28 s (ref 24–36)

## 2016-08-10 SURGERY — Surgical Case
Anesthesia: General

## 2016-08-10 MED ORDER — HYDROMORPHONE HCL 1 MG/ML IJ SOLN: 0.2500 mg | INTRAMUSCULAR | Status: DC | PRN

## 2016-08-10 MED ORDER — DEXAMETHASONE SODIUM PHOSPHATE 4 MG/ML IJ SOLN
INTRAMUSCULAR | Status: AC
  Filled 2016-08-10: qty 1

## 2016-08-10 MED ORDER — ONDANSETRON HCL 4 MG/2ML IJ SOLN
INTRAMUSCULAR | Status: DC | PRN
  Administered 2016-08-10: 4 mg via INTRAVENOUS

## 2016-08-10 MED ORDER — CARBOPROST TROMETHAMINE 250 MCG/ML IM SOLN
INTRAMUSCULAR | Status: DC | PRN
  Administered 2016-08-10: 1000 ug via INTRAMUSCULAR

## 2016-08-10 MED ORDER — DEXAMETHASONE SODIUM PHOSPHATE 10 MG/ML IJ SOLN
INTRAMUSCULAR | Status: DC | PRN
  Administered 2016-08-10: 4 mg via INTRAVENOUS

## 2016-08-10 MED ORDER — BUPIVACAINE IN DEXTROSE 0.75-8.25 % IT SOLN
INTRATHECAL | Status: AC
  Filled 2016-08-10: qty 2

## 2016-08-10 MED ORDER — OXYCODONE HCL 5 MG PO TABS: 5.0000 mg | ORAL_TABLET | Freq: Once | ORAL | Status: DC | PRN

## 2016-08-10 MED ORDER — ACETAMINOPHEN 325 MG PO TABS
650.0000 mg | ORAL_TABLET | ORAL | Status: DC | PRN
  Administered 2016-08-12: 650 mg via ORAL
  Filled 2016-08-10: qty 2

## 2016-08-10 MED ORDER — LIDOCAINE HCL (CARDIAC) 20 MG/ML IV SOLN
INTRAVENOUS | Status: AC
  Filled 2016-08-10: qty 10

## 2016-08-10 MED ORDER — SIMETHICONE 80 MG PO CHEW
80.0000 mg | CHEWABLE_TABLET | Freq: Three times a day (TID) | ORAL | Status: DC
  Administered 2016-08-11 – 2016-08-14 (×9): 80 mg via ORAL
  Filled 2016-08-10 (×10): qty 1

## 2016-08-10 MED ORDER — METHYLERGONOVINE MALEATE 0.2 MG/ML IJ SOLN
INTRAMUSCULAR | Status: DC | PRN
  Administered 2016-08-10: 0.2 mg via INTRAMUSCULAR

## 2016-08-10 MED ORDER — ACETAMINOPHEN 325 MG PO TABS: 650.0000 mg | ORAL_TABLET | ORAL | Status: DC | PRN

## 2016-08-10 MED ORDER — PROPOFOL 10 MG/ML IV BOLUS
INTRAVENOUS | Status: AC
  Filled 2016-08-10: qty 20

## 2016-08-10 MED ORDER — SCOPOLAMINE 1 MG/3DAYS TD PT72
MEDICATED_PATCH | TRANSDERMAL | Status: AC
  Filled 2016-08-10: qty 1

## 2016-08-10 MED ORDER — OXYCODONE HCL 5 MG/5ML PO SOLN: 5.0000 mg | Freq: Once | ORAL | Status: DC | PRN

## 2016-08-10 MED ORDER — FENTANYL CITRATE (PF) 100 MCG/2ML IJ SOLN
INTRAMUSCULAR | Status: DC | PRN
  Administered 2016-08-10: 250 ug via INTRAVENOUS

## 2016-08-10 MED ORDER — HYDROMORPHONE HCL 1 MG/ML IJ SOLN
INTRAMUSCULAR | Status: AC
  Administered 2016-08-10: 0.5 mg via INTRAVENOUS
  Filled 2016-08-10: qty 1

## 2016-08-10 MED ORDER — PRENATAL MULTIVITAMIN CH: 1.0000 | ORAL_TABLET | Freq: Every day | ORAL | Status: DC

## 2016-08-10 MED ORDER — FENTANYL CITRATE (PF) 250 MCG/5ML IJ SOLN
INTRAMUSCULAR | Status: AC
  Filled 2016-08-10: qty 5

## 2016-08-10 MED ORDER — TETANUS-DIPHTH-ACELL PERTUSSIS 5-2.5-18.5 LF-MCG/0.5 IM SUSP: 0.5000 mL | Freq: Once | INTRAMUSCULAR | Status: DC

## 2016-08-10 MED ORDER — CEFAZOLIN SODIUM-DEXTROSE 2-4 GM/100ML-% IV SOLN
INTRAVENOUS | Status: AC
  Filled 2016-08-10: qty 100

## 2016-08-10 MED ORDER — CARBOPROST TROMETHAMINE 250 MCG/ML IM SOLN
INTRAMUSCULAR | Status: AC
  Filled 2016-08-10: qty 1

## 2016-08-10 MED ORDER — SIMETHICONE 80 MG PO CHEW
80.0000 mg | CHEWABLE_TABLET | ORAL | Status: DC
  Administered 2016-08-10 – 2016-08-13 (×3): 80 mg via ORAL
  Filled 2016-08-10 (×3): qty 1

## 2016-08-10 MED ORDER — HYDROMORPHONE HCL 1 MG/ML IJ SOLN
INTRAMUSCULAR | Status: AC
  Filled 2016-08-10: qty 1

## 2016-08-10 MED ORDER — CEFAZOLIN SODIUM-DEXTROSE 2-4 GM/100ML-% IV SOLN
INTRAVENOUS | Status: AC
  Filled 2016-08-10: qty 300

## 2016-08-10 MED ORDER — DIPHENHYDRAMINE HCL 25 MG PO CAPS
25.0000 mg | ORAL_CAPSULE | Freq: Four times a day (QID) | ORAL | Status: DC | PRN
  Administered 2016-08-12: 25 mg via ORAL
  Filled 2016-08-10: qty 1

## 2016-08-10 MED ORDER — MIDAZOLAM HCL 2 MG/2ML IJ SOLN
INTRAMUSCULAR | Status: AC
  Filled 2016-08-10: qty 2

## 2016-08-10 MED ORDER — SODIUM CHLORIDE 0.9 % IV SOLN: 10.0000 mL/h | Freq: Once | INTRAVENOUS | Status: DC

## 2016-08-10 MED ORDER — SUCCINYLCHOLINE CHLORIDE 200 MG/10ML IV SOSY
PREFILLED_SYRINGE | INTRAVENOUS | Status: AC
  Filled 2016-08-10: qty 10

## 2016-08-10 MED ORDER — METHYLERGONOVINE MALEATE 0.2 MG PO TABS
ORAL_TABLET | ORAL | Status: AC
  Administered 2016-08-10: 0.2 mg via ORAL
  Filled 2016-08-10: qty 1

## 2016-08-10 MED ORDER — METHYLERGONOVINE MALEATE 0.2 MG PO TABS
0.2000 mg | ORAL_TABLET | ORAL | Status: AC
  Administered 2016-08-10 – 2016-08-11 (×6): 0.2 mg via ORAL
  Filled 2016-08-10 (×5): qty 1

## 2016-08-10 MED ORDER — COCONUT OIL OIL: 1.0000 "application " | TOPICAL_OIL | Status: DC | PRN

## 2016-08-10 MED ORDER — ZOLPIDEM TARTRATE 5 MG PO TABS: 5.0000 mg | ORAL_TABLET | Freq: Every evening | ORAL | Status: DC | PRN

## 2016-08-10 MED ORDER — HYDROMORPHONE HCL 1 MG/ML IJ SOLN
INTRAMUSCULAR | Status: DC | PRN
  Administered 2016-08-10: 1 mg via INTRAVENOUS

## 2016-08-10 MED ORDER — OXYTOCIN 40 UNITS IN LACTATED RINGERS INFUSION - SIMPLE MED: 2.5000 [IU]/h | INTRAVENOUS | Status: AC

## 2016-08-10 MED ORDER — MEPERIDINE HCL 25 MG/ML IJ SOLN: 6.2500 mg | INTRAMUSCULAR | Status: DC | PRN

## 2016-08-10 MED ORDER — CEFAZOLIN SODIUM-DEXTROSE 2-3 GM-% IV SOLR
INTRAVENOUS | Status: DC | PRN
  Administered 2016-08-10: 1 g via INTRAVENOUS
  Administered 2016-08-10: 2 g via INTRAVENOUS

## 2016-08-10 MED ORDER — MENTHOL 3 MG MT LOZG
1.0000 | LOZENGE | OROMUCOSAL | Status: DC | PRN
  Filled 2016-08-10: qty 9

## 2016-08-10 MED ORDER — CALCIUM CARBONATE ANTACID 500 MG PO CHEW: 2.0000 | CHEWABLE_TABLET | ORAL | Status: DC | PRN

## 2016-08-10 MED ORDER — PROMETHAZINE HCL 25 MG/ML IJ SOLN: 6.2500 mg | INTRAMUSCULAR | Status: DC | PRN

## 2016-08-10 MED ORDER — METHYLERGONOVINE MALEATE 0.2 MG/ML IJ SOLN
INTRAMUSCULAR | Status: AC
  Filled 2016-08-10: qty 1

## 2016-08-10 MED ORDER — SODIUM CHLORIDE 0.9 % IR SOLN
Status: DC | PRN
  Administered 2016-08-10: 1000 mL

## 2016-08-10 MED ORDER — OXYTOCIN 10 UNIT/ML IJ SOLN
INTRAMUSCULAR | Status: AC
  Filled 2016-08-10: qty 4

## 2016-08-10 MED ORDER — GLYBURIDE 2.5 MG PO TABS
2.5000 mg | ORAL_TABLET | Freq: Two times a day (BID) | ORAL | Status: DC
  Filled 2016-08-10 (×2): qty 1

## 2016-08-10 MED ORDER — HYDROMORPHONE HCL 1 MG/ML IJ SOLN
0.2500 mg | INTRAMUSCULAR | Status: DC | PRN
  Administered 2016-08-10 (×2): 0.5 mg via INTRAVENOUS

## 2016-08-10 MED ORDER — MIDAZOLAM HCL 2 MG/2ML IJ SOLN
INTRAMUSCULAR | Status: DC | PRN
  Administered 2016-08-10: 2 mg via INTRAVENOUS

## 2016-08-10 MED ORDER — OXYCODONE-ACETAMINOPHEN 5-325 MG PO TABS
2.0000 | ORAL_TABLET | ORAL | Status: DC | PRN
  Administered 2016-08-11 – 2016-08-12 (×7): 2 via ORAL
  Filled 2016-08-10 (×8): qty 2

## 2016-08-10 MED ORDER — LACTATED RINGERS IV SOLN
INTRAVENOUS | Status: DC
  Administered 2016-08-11 (×2): via INTRAVENOUS

## 2016-08-10 MED ORDER — ASPIRIN EC 81 MG PO TBEC
81.0000 mg | DELAYED_RELEASE_TABLET | Freq: Every day | ORAL | Status: DC
  Filled 2016-08-10 (×2): qty 1

## 2016-08-10 MED ORDER — LACTATED RINGERS IV SOLN
INTRAVENOUS | Status: DC | PRN
  Administered 2016-08-10: 19:00:00 via INTRAVENOUS

## 2016-08-10 MED ORDER — BETAMETHASONE SOD PHOS & ACET 6 (3-3) MG/ML IJ SUSP
12.0000 mg | INTRAMUSCULAR | Status: DC
  Filled 2016-08-10: qty 2

## 2016-08-10 MED ORDER — OXYTOCIN 10 UNIT/ML IJ SOLN
INTRAVENOUS | Status: DC | PRN
  Administered 2016-08-10: 40 [IU] via INTRAVENOUS

## 2016-08-10 MED ORDER — PRENATAL MULTIVITAMIN CH
1.0000 | ORAL_TABLET | Freq: Every day | ORAL | Status: DC
  Administered 2016-08-11 – 2016-08-14 (×4): 1 via ORAL
  Filled 2016-08-10 (×4): qty 1

## 2016-08-10 MED ORDER — SENNOSIDES-DOCUSATE SODIUM 8.6-50 MG PO TABS
2.0000 | ORAL_TABLET | ORAL | Status: DC
  Administered 2016-08-10 – 2016-08-13 (×3): 2 via ORAL
  Filled 2016-08-10 (×3): qty 2

## 2016-08-10 MED ORDER — ONDANSETRON HCL 4 MG/2ML IJ SOLN
INTRAMUSCULAR | Status: AC
  Filled 2016-08-10: qty 2

## 2016-08-10 MED ORDER — SIMETHICONE 80 MG PO CHEW: 80.0000 mg | CHEWABLE_TABLET | ORAL | Status: DC | PRN

## 2016-08-10 MED ORDER — SODIUM CHLORIDE 0.9 % IV SOLN: Freq: Once | INTRAVENOUS | Status: DC

## 2016-08-10 MED ORDER — HYDROMORPHONE HCL 1 MG/ML IJ SOLN
0.2500 mg | INTRAMUSCULAR | Status: DC | PRN
  Administered 2016-08-10 – 2016-08-11 (×3): 0.5 mg via INTRAVENOUS
  Filled 2016-08-10 (×3): qty 1

## 2016-08-10 MED ORDER — CEFAZOLIN SODIUM-DEXTROSE 1-4 GM/50ML-% IV SOLN: INTRAVENOUS | Status: DC | PRN

## 2016-08-10 MED ORDER — DIBUCAINE 1 % RE OINT: 1.0000 "application " | TOPICAL_OINTMENT | RECTAL | Status: DC | PRN

## 2016-08-10 MED ORDER — IBUPROFEN 600 MG PO TABS
600.0000 mg | ORAL_TABLET | Freq: Four times a day (QID) | ORAL | Status: DC
  Administered 2016-08-10 – 2016-08-14 (×14): 600 mg via ORAL
  Filled 2016-08-10 (×14): qty 1

## 2016-08-10 MED ORDER — METFORMIN HCL 500 MG PO TABS
500.0000 mg | ORAL_TABLET | Freq: Two times a day (BID) | ORAL | Status: DC
  Filled 2016-08-10 (×2): qty 1

## 2016-08-10 MED ORDER — OXYCODONE-ACETAMINOPHEN 5-325 MG PO TABS
1.0000 | ORAL_TABLET | ORAL | Status: DC | PRN
  Administered 2016-08-12 – 2016-08-14 (×6): 1 via ORAL
  Filled 2016-08-10 (×5): qty 1

## 2016-08-10 MED ORDER — WITCH HAZEL-GLYCERIN EX PADS: 1.0000 "application " | MEDICATED_PAD | CUTANEOUS | Status: DC | PRN

## 2016-08-10 MED ORDER — DOCUSATE SODIUM 100 MG PO CAPS: 100.0000 mg | ORAL_CAPSULE | Freq: Every day | ORAL | Status: DC

## 2016-08-10 SURGICAL SUPPLY — 47 items
ADH SKN CLS APL DERMABOND .7 (GAUZE/BANDAGES/DRESSINGS)
CHLORAPREP W/TINT 26ML (MISCELLANEOUS) ×6 IMPLANT
CLAMP CORD UMBIL (MISCELLANEOUS) ×8 IMPLANT
CLOSURE WOUND 1/2 X4 (GAUZE/BANDAGES/DRESSINGS)
CLOTH BEACON ORANGE TIMEOUT ST (SAFETY) ×3 IMPLANT
DERMABOND ADVANCED (GAUZE/BANDAGES/DRESSINGS)
DERMABOND ADVANCED .7 DNX12 (GAUZE/BANDAGES/DRESSINGS) ×2 IMPLANT
DRSG OPSITE POSTOP 4X10 (GAUZE/BANDAGES/DRESSINGS) ×3 IMPLANT
ELECT REM PT RETURN 9FT ADLT (ELECTROSURGICAL) ×3
ELECTRODE REM PT RTRN 9FT ADLT (ELECTROSURGICAL) ×1 IMPLANT
EXTRACTOR VACUUM BELL STYLE (SUCTIONS) IMPLANT
GAUZE SPONGE 4X4 12PLY STRL LF (GAUZE/BANDAGES/DRESSINGS) ×2 IMPLANT
GLOVE BIOGEL PI IND STRL 7.0 (GLOVE) ×1 IMPLANT
GLOVE BIOGEL PI IND STRL 8 (GLOVE) ×1 IMPLANT
GLOVE BIOGEL PI INDICATOR 7.0 (GLOVE) ×2
GLOVE BIOGEL PI INDICATOR 8 (GLOVE) ×4
GLOVE ECLIPSE 8.0 STRL XLNG CF (GLOVE) ×3 IMPLANT
GOWN STRL REUS W/TWL LRG LVL3 (GOWN DISPOSABLE) ×10 IMPLANT
KIT ABG SYR 3ML LUER SLIP (SYRINGE) ×3 IMPLANT
NDL HYPO 18GX1.5 BLUNT FILL (NEEDLE) ×1 IMPLANT
NDL HYPO 25X5/8 SAFETYGLIDE (NEEDLE) ×1 IMPLANT
NEEDLE HYPO 18GX1.5 BLUNT FILL (NEEDLE) ×3 IMPLANT
NEEDLE HYPO 22GX1.5 SAFETY (NEEDLE) ×3 IMPLANT
NEEDLE HYPO 25X5/8 SAFETYGLIDE (NEEDLE) ×3 IMPLANT
NS IRRIG 1000ML POUR BTL (IV SOLUTION) ×3 IMPLANT
PACK C SECTION WH (CUSTOM PROCEDURE TRAY) ×3 IMPLANT
PAD ABD 7.5X8 STRL (GAUZE/BANDAGES/DRESSINGS) ×2 IMPLANT
PAD OB MATERNITY 4.3X12.25 (PERSONAL CARE ITEMS) ×3 IMPLANT
PENCIL SMOKE EVAC W/HOLSTER (ELECTROSURGICAL) ×3 IMPLANT
RTRCTR C-SECT PINK 25CM LRG (MISCELLANEOUS) IMPLANT
SPONGE GAUZE 4X4 12PLY STER LF (GAUZE/BANDAGES/DRESSINGS) ×2 IMPLANT
SPONGE LAP 18X18 X RAY DECT (DISPOSABLE) ×2 IMPLANT
STAPLER VISISTAT 35W (STAPLE) ×2 IMPLANT
STRIP CLOSURE SKIN 1/2X4 (GAUZE/BANDAGES/DRESSINGS) IMPLANT
SUT CHROMIC 0 CT 1 (SUTURE) ×3 IMPLANT
SUT MNCRL 0 VIOLET CTX 36 (SUTURE) ×2 IMPLANT
SUT MONOCRYL 0 CTX 36 (SUTURE) ×6
SUT PLAIN 2 0 (SUTURE)
SUT PLAIN 2 0 XLH (SUTURE) IMPLANT
SUT PLAIN ABS 2-0 CT1 27XMFL (SUTURE) IMPLANT
SUT VIC AB 0 CTX 36 (SUTURE) ×3
SUT VIC AB 0 CTX36XBRD ANBCTRL (SUTURE) ×1 IMPLANT
SUT VIC AB 4-0 KS 27 (SUTURE) IMPLANT
SYR 20CC LL (SYRINGE) ×6 IMPLANT
TOWEL OR 17X24 6PK STRL BLUE (TOWEL DISPOSABLE) ×3 IMPLANT
TRAY FOLEY BAG SILVER LF 14FR (SET/KITS/TRAYS/PACK) ×2 IMPLANT
TUBING SCD EXPRESS 7FT (MISCELLANEOUS) ×2 IMPLANT

## 2016-08-10 NOTE — H&P (Signed)
FACULTY PRACTICE ANTEPARTUM ADMISSION HISTORY AND PHYSICAL NOTE   History of Present Illness: [name] is a 31 y.o. G27P2002 with di/di twin gestation and Type II DM at [redacted]w[redacted]d admitted for concerning BPP of 4/8 of Twin B (Twin A had 6/8, neither had breathing, B had poor tone) in the setting of decreased fetal movement x 2 days.  Patient reports uterine contraction  activity as irregular. Patient reports  vaginal bleeding as none. Patient describes fluid per vagina as none. Fetal presentation is cephalic/cephalic for both twins as of today's ultrasound.  Patient Active Problem List   Diagnosis Date Noted  . Abnormal BPP; Twin B 4/8 08/10/2016  . Abnormal quad screen   . GBS bacteriuria 02/19/2016  . Rubella non-immune status, antepartum 02/19/2016  . Supervision of high risk pregnancy in first trimester 02/17/2016  . Dichorionic diamniotic twin pregnancy, antepartum 02/17/2016  . Type 2 diabetes mellitus in pregnancy 02/08/2016  . Obesity 04/12/2010  . MIGRAINE, UNSPEC., W/O INTRACTABLE MIGRAINE 04/27/2006    Past Medical History:  Diagnosis Date  . Anemia   . Diabetes in pregnancy 11/08/2010    Past Surgical History:  Procedure Laterality Date  . NO PAST SURGERIES      OB History  Gravida Para Term Preterm AB Living  3 2 2  0 0 2  SAB TAB Ectopic Multiple Live Births  0 0 0 0 2    # Outcome Date GA Lbr Len/2nd Weight Sex Delivery Anes PTL Lv  3 Current           2 Term 01/19/11 [redacted]w[redacted]d -11:22 / 00:06 8 lb 7.6 oz (3.845 kg) F Vag-Spont None  LIV     Birth Comments: None  1 Term 05/13/02 [redacted]w[redacted]d  6 lb 4 oz (2.835 kg) F Vag-Spont  N LIV      Social History   Social History  . Marital status: Married    Spouse name: N/A  . Number of children: N/A  . Years of education: N/A   Social History Main Topics  . Smoking status: Former Smoker    Types: Cigarettes    Quit date: 06/29/2011  . Smokeless tobacco: Never Used  . Alcohol use 1.2 oz/week    2 Standard drinks or  equivalent per week     Comment: prior to preg  . Drug use: No  . Sexual activity: Yes    Partners: Male    Birth control/ protection: Other-see comments     Comment: last sex was February 29 2016.   Other Topics Concern  . Not on file   Social History Narrative  . No narrative on file    Family History  Problem Relation Age of Onset  . Thyroid disease Mother     No Known Allergies  Prescriptions Prior to Admission  Medication Sig Dispense Refill Last Dose  . ACCU-CHEK SOFTCLIX LANCETS lancets U UTD  12 08/10/2016 at Unknown time  . aspirin EC 81 MG tablet Take 1 tablet (81 mg total) by mouth daily. 90 tablet 3 08/09/2016 at Unknown time  . docusate sodium (COLACE) 100 MG capsule Take 1 capsule (100 mg total) by mouth 2 (two) times daily. 30 capsule 3 08/09/2016 at Unknown time  . glucose blood (ACCU-CHEK AVIVA PLUS) test strip Use as instructed 100 each 12 08/10/2016 at Unknown time  . glyBURIDE (DIABETA) 2.5 MG tablet Take 1 tablet (2.5 mg total) by mouth 2 (two) times daily with a meal. 60 tablet 3 08/10/2016 at Unknown time  .  metFORMIN (GLUCOPHAGE) 500 MG tablet Take 1 tablet (500 mg total) by mouth 2 (two) times daily with a meal. 60 tablet 11 08/10/2016 at Unknown time  . Prenatal Multivit-Min-Fe-FA (PRENATAL VITAMINS) 0.8 MG tablet Take 1 tablet by mouth daily. 30 tablet 12 08/09/2016 at Unknown time    Review of Systems - Negative except for is mentioned in HPI  Vitals:  BP 126/68 (BP Location: Left Arm)   Pulse 80   Temp 97.7 F (36.5 C) (Oral)   Resp 20   LMP 12/10/2015 (Within Days)   SpO2 99%  Physical Examination: CONSTITUTIONAL: Well-developed, well-nourished female in no acute distress.  HENT:  Normocephalic, atraumatic, External right and left ear normal. Oropharynx is clear and moist EYES: Conjunctivae and EOM are normal. Pupils are equal, round, and reactive to light. No scleral icterus.  NECK: Normal range of motion, supple, no masses SKIN: Skin is warm and  dry. No rash noted. Not diaphoretic. No erythema. No pallor. NEUROLGIC: Alert and oriented to person, place, and time. Normal reflexes, muscle tone coordination. No cranial nerve deficit noted. PSYCHIATRIC: Normal mood and affect. Normal behavior. Normal judgment and thought content. CARDIOVASCULAR: Normal heart rate noted, regular rhythm RESPIRATORY: Effort and breath sounds normal, no problems with respiration noted ABDOMEN: Soft, nontender, nondistended, gravid. MUSCULOSKELETAL: Normal range of motion. No edema and no tenderness. 2+ distal pulses.  Cervix: Evaluated by digital exam. and Position: posterior and found to be 1 cm/ Long/Floating and fetal presentation is cephalic and cephalic on ultrasound. Membranes:intact   Labs:  No results found for this or any previous visit (from the past 24 hour(s)).  Imaging Studies: [place] Mfm Fetal Bpp Wo Non Stress  Result Date: 08/10/2016 ----------------------------------------------------------------------  OBSTETRICS REPORT                      (Signed Final 08/10/2016 02:24 pm) ---------------------------------------------------------------------- Patient Info  ID #:       [id number]                         D.O.B.:   December 02, 1985 (31 yrs)  Name:       Kathy Sandoval                Visit Date:  08/10/2016 12:45 pm ---------------------------------------------------------------------- Performed By  Performed By:     Tommie Raymond BS,       Ref. Address:     [address] [place]                                                             [address]                                                             [place], [place]  [address]  Attending:        Charlsie Merles MD         Location:         [facility]  Referred By:      Catalina Antigua MD ---------------------------------------------------------------------- Orders   #  Description                                  Code   1  [place] MFM OB FOLLOW UP                         60454.09   2  [place] MFM OB FOLLOW UP ADDL GEST               81191.47   3  [place] MFM FETAL BPP WO NON STRESS              76819.01   4  [place] MFM FETAL BPP WO NST ADDL                82956.2      GESTATION  ----------------------------------------------------------------------   #  Ordered By               Order #        Accession #    Episode #   1  Particia Nearing            [id number]      [id number]     [id number]   2  MARTHA DECKER            [id number]      [id number]     [id number]   3  TANYA PRATT              [id number]      [id number]     [id number]   4  TANYA PRATT              [id number]      [id number]     [id number]  ---------------------------------------------------------------------- Indications   [redacted] weeks gestation of pregnancy                Z3A.9   Twin pregnancy, di/di, third trimester         O30.043   Obesity complicating pregnancy, third          O99.213   trimester   Abnormal biochemical screen (quad) for         O28.9   Trisomy 21-low risk NIPS   Pre-existing diabetes, type 2, in pregnancy,   O24.113   third trimester (Metformin, Glyburide)   Encounter for other antenatal screening        Z36.2   follow-up   Decreased fetal movements, third trimester,    O36.8130   unspecified  ---------------------------------------------------------------------- OB History  Gravidity:    3         Term:   2        Prem:   0        SAB:   0  TOP:          0       Ectopic:  0        Living: 2 ---------------------------------------------------------------------- Fetal Evaluation (Fetus A)  Num Of Fetuses:     2  Fetal Heart         158  Rate(bpm):  Cardiac Activity:   Observed  Fetal Lie:          Lower Left Fetus  Presentation:       Cephalic  Placenta:           Anterior, above cervical os  P. Cord Insertion:  Previously Visualized  Amniotic Fluid  AFI FV:      Subjectively within normal limits                              Largest Pocket(cm)                               6.62 ---------------------------------------------------------------------- Biophysical Evaluation (Fetus A)  Amniotic F.V:   Within normal limits       F. Tone:        Observed  F. Movement:    Observed                   Score:          6/8  F. Breathing:   Not Observed ---------------------------------------------------------------------- Biometry (Fetus A)  BPD:      87.2  mm     G. Age:  35w 1d         62  %    CI:        78.37   %   70 - 86                                                          FL/HC:      22.7   %   20.1 - 22.3  HC:      311.6  mm     G. Age:  34w 6d         18  %    HC/AC:      0.95       0.93 - 1.11  AC:      327.9  mm     G. Age:  36w 5d         93  %    FL/BPD:     81.1   %   71 - 87  FL:       70.7  mm     G. Age:  36w 2d         77  %    FL/AC:      21.6   %   20 - 24  HUM:      56.2  mm     G. Age:  32w 5d         20  %  Est. FW:    2878  gm      6 lb 6 oz     82  %     FW Discordancy     0 \ 11 % ---------------------------------------------------------------------- Gestational Age (Fetus A)  LMP:           34w 6d       Date:   12/10/15  EDD:   09/15/16  U/S Today:     35w 5d                                        EDD:   09/09/16  Best:          34w 6d    Det. By:   LMP  (12/10/15)          EDD:   09/15/16 ---------------------------------------------------------------------- Anatomy (Fetus A)  Cranium:               Previously seen        Aortic Arch:            Previously seen  Cavum:                 Previously seen        Ductal Arch:            Previously seen  Ventricles:            Previously seen        Diaphragm:              Appears normal  Choroid Plexus:        Previously seen        Stomach:                Appears normal, left                                                                        sided  Cerebellum:            Previously seen        Abdomen:                Previously seen  Posterior Fossa:       Previously seen        Abdominal Wall:          Previously seen  Nuchal Fold:           Not applicable (>20    Cord Vessels:           Previously seen                         wks GA)  Face:                  Orbits and profile     Kidneys:                Appear normal                         previously seen  Lips:                  Previously seen        Bladder:                Appears normal  Thoracic:              Previously seen        Spine:  Limited views                                                                        previously seen  Heart:                 Previously seen        Upper Extremities:      Previously seen  RVOT:                  Previously seen        Lower Extremities:      Previously seen  LVOT:                  Previously seen  Other:  Parents do not wish to know sex of fetus. Fetus appears to be a female.          Technically difficult due to maternal habitus. ---------------------------------------------------------------------- Fetal Evaluation (Fetus B)  Num Of Fetuses:     2  Fetal Heart         168  Rate(bpm):  Cardiac Activity:   Observed  Fetal Lie:          Upper Right Fetus  Presentation:       Cephalic  Placenta:           Anterior, above cervical os  P. Cord Insertion:  Marginal insertion previously  Amniotic Fluid  AFI FV:      Subjectively within normal limits                              Largest Pocket(cm)                              4.4 ---------------------------------------------------------------------- Biophysical Evaluation (Fetus B)  Amniotic F.V:   Within normal limits       F. Tone:        Not Observed  F. Movement:    Observed                   Score:          4/8  F. Breathing:   Not Observed ---------------------------------------------------------------------- Biometry (Fetus B)  BPD:      80.6  mm     G. Age:  32w 2d          3  %    CI:        73.46   %   70 - 86                                                          FL/HC:      22.8   %   20.1 - 22.3  HC:      298.8  mm     G. Age:  33w 1d         < 3  %    HC/AC:  0.94       0.93 - 1.11  AC:       319   mm     G. Age:  35w 6d         81  %    FL/BPD:     84.4   %   71 - 87  FL:         68  mm     G. Age:  34w 6d         45  %    FL/AC:      21.3   %   20 - 24  HUM:      57.7  mm     G. Age:  33w 3d         37  %  CER:        46  mm     G. Age:  N/A          > 95  %  Est. FW:    2548  gm    5 lb 10 oz      62  %     FW Discordancy        11  % ---------------------------------------------------------------------- Gestational Age (Fetus B)  LMP:           34w 6d       Date:   12/10/15                 EDD:   09/15/16  U/S Today:     34w 0d                                        EDD:   09/21/16  Best:          34w 6d    Det. By:   LMP  (12/10/15)          EDD:   09/15/16 ---------------------------------------------------------------------- Anatomy (Fetus B)  Cranium:               Previously seen        Aortic Arch:            Previously seen  Cavum:                 Previously seen        Ductal Arch:            Previously seen  Ventricles:            Previously seen        Diaphragm:              Appears normal  Choroid Plexus:        Previously seen        Stomach:                Appears normal, left                                                                        sided  Cerebellum:            Previously seen  Abdomen:                Previously seen  Posterior Fossa:       Previously seen        Abdominal Wall:         Previously seen  Nuchal Fold:           Not applicable (>20    Cord Vessels:           Previously seen                         wks GA)  Face:                  Orbits and profile     Kidneys:                Appear normal                         previously seen  Lips:                  Previously seen        Bladder:                Appears normal  Thoracic:              Previously seen        Spine:                  Previously seen  Heart:                 Previously seen        Upper Extremities:      Previously seen  RVOT:                   Previously seen        Lower Extremities:      Previously seen  LVOT:                  Previously seen  Other:  Parents do not wish to know sex of fetus. Fetus appears to be a          female. Nasal bone previously visualized. Technically difficult due to          maternal habitus and fetal position. ---------------------------------------------------------------------- Cervix Uterus Adnexa  Cervix  Not visualized (advanced GA >29wks)  Uterus  No abnormality visualized.  Left Ovary  No adnexal mass visualized.  Right Ovary  No adnexal mass visualized.  Cul De Sac:   No free fluid seen.  Adnexa:       No abnormality visualized. ---------------------------------------------------------------------- Impression  Dichorionic/diamniotic twin pregnancy at 34+6 weeks.  Reports decreased fetal movement x 2 days  Normal interval anatomy for both twins  Normal amniotic fluid volume for both twins with MVP 6.6cm  for A and 4.4cm for B  Appropriate interval growths with EFWs at the 82nd and 62nd  percentile, 11% discordance  BPPs are 6/8 for A and 4/8 for B (neither had breathing, B  had poor tone) ---------------------------------------------------------------------- Recommendations  To MAU. Recommend NST/overnight observation and repeat  BPP in AM ----------------------------------------------------------------------                 Charlsie Merles, MD Electronically Signed Final Report   08/10/2016 02:24 pm ----------------------------------------------------------------------  [place] Mfm Ob Follow Up  Result Date: 08/10/2016 ----------------------------------------------------------------------  OBSTETRICS REPORT                      (  Signed Final 08/10/2016 02:24 pm) ---------------------------------------------------------------------- Patient Info  ID #:       [id number]                         D.O.B.:   1985-10-17 (31 yrs)  Name:       Kathy Sandoval                Visit Date:  08/10/2016 12:45 pm  ---------------------------------------------------------------------- Performed By  Performed By:     Ramond Craver Tester BS,       Ref. Address:     [address] [place]                                                             [address]                                                             [place], [place]                                                             [address]  Attending:        Charlsie Merles MD         Location:         [facility]  Referred By:      Catalina Antigua MD ---------------------------------------------------------------------- Orders   #  Description                                 Code   1  [place] MFM OB FOLLOW UP                         [id number]   2  [place] MFM OB FOLLOW UP ADDL GEST               19147.82   3  [place] MFM FETAL BPP WO NON STRESS              76819.01   4  [place] MFM FETAL BPP WO NST ADDL                95621.3      [place]ST[place]  ----------------------------------------------------------------------   #  Ordered By               Order #        Accession #    Episode #   1  Particia Nearing            [id number]      [id number]     [id number]  2  MARTHA DECKER            [id number]      [id number]     [id number]   3  TANYA PRATT              [id number]      [id number]     [id number]   4  Tinnie Gens              [id number]      [id number]     [id number]  ---------------------------------------------------------------------- Indications   [redacted] weeks gestation of pregnancy                Z3A.20   Twin pregnancy, di/di, third trimester         O30.043   Obesity complicating pregnancy, third          O99.213   trimester   Abnormal biochemical screen (quad) for         O28.9   Trisomy 21-low risk NIPS   Pre-existing diabetes, type 2, in pregnancy,   O24.113   third trimester (Metformin, Glyburide)   Encounter for other antenatal screening        Z36.2   follow-up   Decreased fetal movements, third trimester,    O36.8130   unspecified   ---------------------------------------------------------------------- OB History  Gravidity:    3         Term:   2        Prem:   0        SAB:   0  TOP:          0       Ectopic:  0        Living: 2 ---------------------------------------------------------------------- Fetal Evaluation (Fetus A)  Num Of Fetuses:     2  Fetal Heart         158  Rate(bpm):  Cardiac Activity:   Observed  Fetal Lie:          Lower Left Fetus  Presentation:       Cephalic  Placenta:           Anterior, above cervical os  P. Cord Insertion:  Previously Visualized  Amniotic Fluid  AFI FV:      Subjectively within normal limits                              Largest Pocket(cm)                              6.62 ---------------------------------------------------------------------- Biophysical Evaluation (Fetus A)  Amniotic F.V:   Within normal limits       F. Tone:        Observed  F. Movement:    Observed                   Score:          6/8  F. Breathing:   Not Observed ---------------------------------------------------------------------- Biometry (Fetus A)  BPD:      87.2  mm     G. Age:  35w 1d         62  %    CI:        78.37   %   70 - 86  FL/HC:      22.7   %   20.1 - 22.3  HC:      311.6  mm     G. Age:  34w 6d         18  %    HC/AC:      0.95       0.93 - 1.11  AC:      327.9  mm     G. Age:  36w 5d         93  %    FL/BPD:     81.1   %   71 - 87  FL:       70.7  mm     G. Age:  36w 2d         77  %    FL/AC:      21.6   %   20 - 24  HUM:      56.2  mm     G. Age:  32w 5d         20  %  Est. FW:    2878  gm      6 lb 6 oz     82  %     FW Discordancy     0 \ 11 % ---------------------------------------------------------------------- Gestational Age (Fetus A)  LMP:           34w 6d       Date:   12/10/15                 EDD:   09/15/16  U/S Today:     35w 5d                                        EDD:   09/09/16  Best:          34w 6d    Det. By:   LMP  (12/10/15)           EDD:   09/15/16 ---------------------------------------------------------------------- Anatomy (Fetus A)  Cranium:               Previously seen        Aortic Arch:            Previously seen  Cavum:                 Previously seen        Ductal Arch:            Previously seen  Ventricles:            Previously seen        Diaphragm:              Appears normal  Choroid Plexus:        Previously seen        Stomach:                Appears normal, left  sided  Cerebellum:            Previously seen        Abdomen:                Previously seen  Posterior Fossa:       Previously seen        Abdominal Wall:         Previously seen  Nuchal Fold:           Not applicable (>20    Cord Vessels:           Previously seen                         wks GA)  Face:                  Orbits and profile     Kidneys:                Appear normal                         previously seen  Lips:                  Previously seen        Bladder:                Appears normal  Thoracic:              Previously seen        Spine:                  Limited views                                                                        previously seen  Heart:                 Previously seen        Upper Extremities:      Previously seen  RVOT:                  Previously seen        Lower Extremities:      Previously seen  LVOT:                  Previously seen  Other:  Parents do not wish to know sex of fetus. Fetus appears to be a female.          Technically difficult due to maternal habitus. ---------------------------------------------------------------------- Fetal Evaluation (Fetus B)  Num Of Fetuses:     2  Fetal Heart         168  Rate(bpm):  Cardiac Activity:   Observed  Fetal Lie:          Upper Right Fetus  Presentation:       Cephalic  Placenta:           Anterior, above cervical os  P. Cord Insertion:  Marginal insertion previously  Amniotic Fluid  AFI FV:       Subjectively within normal limits  Largest Pocket(cm)                              4.4 ---------------------------------------------------------------------- Biophysical Evaluation (Fetus B)  Amniotic F.V:   Within normal limits       F. Tone:        Not Observed  F. Movement:    Observed                   Score:          4/8  F. Breathing:   Not Observed ---------------------------------------------------------------------- Biometry (Fetus B)  BPD:      80.6  mm     G. Age:  32w 2d          3  %    CI:        73.46   %   70 - 86                                                          FL/HC:      22.8   %   20.1 - 22.3  HC:      298.8  mm     G. Age:  33w 1d        < 3  %    HC/AC:      0.94       0.93 - 1.11  AC:       319   mm     G. Age:  35w 6d         81  %    FL/BPD:     84.4   %   71 - 87  FL:         68  mm     G. Age:  34w 6d         45  %    FL/AC:      21.3   %   20 - 24  HUM:      57.7  mm     G. Age:  33w 3d         37  %  CER:        46  mm     G. Age:  N/A          > 95  %  Est. FW:    2548  gm    5 lb 10 oz      62  %     FW Discordancy        11  % ---------------------------------------------------------------------- Gestational Age (Fetus B)  LMP:           34w 6d       Date:   12/10/15                 EDD:   09/15/16  U/S Today:     34w 0d                                        EDD:   09/21/16  Best:          34w 6d  Det. By:   LMP  (12/10/15)          EDD:   09/15/16 ---------------------------------------------------------------------- Anatomy (Fetus B)  Cranium:               Previously seen        Aortic Arch:            Previously seen  Cavum:                 Previously seen        Ductal Arch:            Previously seen  Ventricles:            Previously seen        Diaphragm:              Appears normal  Choroid Plexus:        Previously seen        Stomach:                Appears normal, left                                                                         sided  Cerebellum:            Previously seen        Abdomen:                Previously seen  Posterior Fossa:       Previously seen        Abdominal Wall:         Previously seen  Nuchal Fold:           Not applicable (>20    Cord Vessels:           Previously seen                         wks GA)  Face:                  Orbits and profile     Kidneys:                Appear normal                         previously seen  Lips:                  Previously seen        Bladder:                Appears normal  Thoracic:              Previously seen        Spine:                  Previously seen  Heart:                 Previously seen        Upper Extremities:      Previously seen  RVOT:                  Previously  seen        Lower Extremities:      Previously seen  LVOT:                  Previously seen  Other:  Parents do not wish to know sex of fetus. Fetus appears to be a          female. Nasal bone previously visualized. Technically difficult due to          maternal habitus and fetal position. ---------------------------------------------------------------------- Cervix Uterus Adnexa  Cervix  Not visualized (advanced GA >29wks)  Uterus  No abnormality visualized.  Left Ovary  No adnexal mass visualized.  Right Ovary  No adnexal mass visualized.  Cul De Sac:   No free fluid seen.  Adnexa:       No abnormality visualized. ---------------------------------------------------------------------- Impression  Dichorionic/diamniotic twin pregnancy at 34+6 weeks.  Reports decreased fetal movement x 2 days  Normal interval anatomy for both twins  Normal amniotic fluid volume for both twins with MVP 6.6cm  for A and 4.4cm for B  Appropriate interval growths with EFWs at the 82nd and 62nd  percentile, 11% discordance  BPPs are 6/8 for A and 4/8 for B (neither had breathing, B  had poor tone) ---------------------------------------------------------------------- Recommendations  To MAU. Recommend NST/overnight observation  and repeat  BPP in AM ----------------------------------------------------------------------                 Charlsie Merles, MD Electronically Signed Final Report   08/10/2016 02:24 pm ----------------------------------------------------------------------  [place] Mfm Ob Follow Up  Result Date: 07/13/2016 ----------------------------------------------------------------------  OBSTETRICS REPORT                      (Signed Final 07/13/2016 06:11 pm) ---------------------------------------------------------------------- Patient Info  ID #:       [id number]                          D.O.B.:  1985-08-20 (31 yrs)  Name:       Kathy Sandoval                 Visit Date: 07/12/2016 10:37 am ---------------------------------------------------------------------- Performed By  Performed By:     Ramond Craver Tester BS,       Ref. Address:     [address], [place]                                                             [address]                                                             [place], [place]  [address]  Attending:        Particia Nearing MD       Location:         [facility]  Referred By:      Catalina Antigua MD ---------------------------------------------------------------------- Orders   #  Description                                 Code   1  [place] MFM OB FOLLOW UP                         60454.09   2  [place] MFM OB FOLLOW UP ADDL GEST               81191.47  ----------------------------------------------------------------------   #  Ordered By               Order #        Accession #    Episode #   1  Particia Nearing            [id number]      [id number]     [id number]   2  MARTHA DECKER            [id number]      [id number]     [id number]  ---------------------------------------------------------------------- Indications   [redacted] weeks gestation of pregnancy                Z3A.30   Twin pregnancy, di/di, third trimester         O30.043    Obesity complicating pregnancy, third          O99.213   trimester   Abnormal biochemical screen (quad) for         O28.9   Trisomy 21-low risk NIPS   Pre-existing diabetes, type 2, in pregnancy,   O24.113   third trimester (Metformin, Glyburide)   Encounter for other antenatal screening        Z36.2   follow-up  ---------------------------------------------------------------------- OB History  Gravidity:    3         Term:   2        Prem:   0        SAB:   0  TOP:          0       Ectopic:  0        Living: 2 ---------------------------------------------------------------------- Fetal Evaluation (Fetus A)  Num Of Fetuses:     2  Fetal Heart         125  Rate(bpm):  Cardiac Activity:   Observed  Fetal Lie:          Lower Left Fetus  Presentation:       Breech  Placenta:           Anterior, above cervical os  P. Cord Insertion:  Previously Visualized  Amniotic Fluid  AFI FV:      Subjectively within normal limits                              Largest Pocket(cm)  4.9 ---------------------------------------------------------------------- Biometry (Fetus A)  BPD:      80.9  mm     G. Age:  32w 3d         87  %    CI:        84.12   %    70 - 86                                                          FL/HC:      22.2   %    19.3 - 21.3  HC:       278   mm     G. Age:  30w 3d         11  %    HC/AC:      1.02        0.96 - 1.17  AC:      271.5  mm     G. Age:  31w 2d         62  %    FL/BPD:     76.3   %    71 - 87  FL:       61.7  mm     G. Age:  32w 0d         72  %    FL/AC:      22.7   %    20 - 24  HUM:      51.8  mm     G. Age:  30w 2d         43  %  CER:      36.9  mm     G. Age:  31w 6d         63  %  CM:        6.1  mm  Est. FW:    1777  gm    3 lb 15 oz      70  %     FW Discordancy      0 \ 9 % ---------------------------------------------------------------------- Gestational Age (Fetus A)  LMP:           30w 5d        Date:  12/10/15                 EDD:   09/15/16  U/S Today:      31w 4d                                        EDD:   09/09/16  Best:          30w 5d     Det. By:  LMP  (12/10/15)          EDD:   09/15/16 ---------------------------------------------------------------------- Anatomy (Fetus A)  Cranium:               Appears normal         Aortic Arch:            Previously seen  Cavum:                 Previously seen  Ductal Arch:            Previously seen  Ventricles:            Appears normal         Diaphragm:              Appears normal  Choroid Plexus:        Previously seen        Stomach:                Appears normal, left                                                                        sided  Cerebellum:            Appears normal         Abdomen:                Previously seen  Posterior Fossa:       Previously seen        Abdominal Wall:         Previously seen  Nuchal Fold:           Not applicable (>20    Cord Vessels:           Previously seen                         wks GA)  Face:                  Orbits and profile     Kidneys:                Appear normal                         previously seen  Lips:                  Previously seen        Bladder:                Appears normal  Thoracic:              Previously seen        Spine:                  Limited views                                                                        appear normal  Heart:                 Previously seen        Upper Extremities:      Previously seen  RVOT:                  Previously seen        Lower Extremities:      Previously seen  LVOT:                  Previously seen  Other:  Parents do not wish to know sex of fetus. Fetus appears to be a female.          Technically difficult due to maternal habitus. ---------------------------------------------------------------------- Fetal Evaluation (Fetus B)  Num Of Fetuses:     2  Fetal Heart         150  Rate(bpm):  Cardiac Activity:   Observed  Fetal Lie:          Upper Right Fetus  Presentation:       Transverse Oblique,  head to maternal Rt  Placenta:           Anterior, above cervical os  P. Cord Insertion:  Marginal insertion previously  Amniotic Fluid  AFI FV:      Subjectively within normal limits                              Largest Pocket(cm)                              5.0 ---------------------------------------------------------------------- Biometry (Fetus B)  BPD:      74.5  mm     G. Age:  29w 6d         16  %    CI:         77.7   %    70 - 86                                                          FL/HC:      22.0   %    19.3 - 21.3  HC:      267.5  mm     G. Age:  29w 1d        < 3  %    HC/AC:      0.99        0.96 - 1.17  AC:      270.1  mm     G. Age:  31w 1d         58  %    FL/BPD:     78.9   %    71 - 87  FL:       58.8  mm     G. Age:  30w 5d         35  %    FL/AC:      21.8   %    20 - 24  HUM:      51.8  mm     G. Age:  30w 2d         43  %  Est. FW:    1618  gm      3 lb 9 oz     54  %     FW Discordancy         9  % ---------------------------------------------------------------------- Gestational Age (Fetus B)  LMP:           30w 5d        Date:  12/10/15  EDD:   09/15/16  U/S Today:     30w 2d                                        EDD:   09/18/16  Best:          30w 5d     Det. By:  LMP  (12/10/15)          EDD:   09/15/16 ---------------------------------------------------------------------- Anatomy (Fetus B)  Cranium:               Appears normal         Aortic Arch:            Previously seen  Cavum:                 Previously seen        Ductal Arch:            Previously seen  Ventricles:            Appears normal         Diaphragm:              Appears normal  Choroid Plexus:        Previously seen        Stomach:                Appears normal, left                                                                        sided  Cerebellum:            Previously seen        Abdomen:                Previously seen  Posterior Fossa:       Previously seen        Abdominal Wall:          Previously seen  Nuchal Fold:           Not applicable (>20    Cord Vessels:           Previously seen                         wks GA)  Face:                  Orbits and profile     Kidneys:                Appear normal                         previously seen  Lips:                  Previously seen        Bladder:                Appears normal  Thoracic:              Previously seen        Spine:  Previously seen  Heart:                 Previously seen        Upper Extremities:      Previously seen  RVOT:                  Previously seen        Lower Extremities:      Previously seen  LVOT:                  Previously seen  Other:  Parents do not wish to know sex of fetus.Fetus appears to be a          female. Nasal bone previously visualized. Technically difficult due to          maternal habitus. ---------------------------------------------------------------------- Cervix Uterus Adnexa  Cervix  Normal appearance by transabdominal scan.  Uterus  No abnormality visualized.  Left Ovary  Size(cm)     2.29   x   2.3    x  0.92      Vol(ml): 2.5  Within normal limits. No adnexal mass visualized.  Right Ovary  Size(cm)     2.58   x   1.89   x  1.3       Vol(ml): 3.3  Within normal limits. No adnexal mass visualized.  Cul De Sac:   No free fluid seen.  Adnexa:       No abnormality visualized. ---------------------------------------------------------------------- Impression  Dichorionic/diamniotic twin pregnancy at 30+5 weeks  Normal interval anatomy x 2; anatomic survey complete x 2  Normal amniotic fluid volume x 2  Appropriate interval growths with EFWs at the 70th and 54th  %tiles ---------------------------------------------------------------------- Recommendations  Recommend follow-up ultrasound examination in 4 weeks ----------------------------------------------------------------------                 Particia Nearing, MD Electronically Signed Final Report   07/13/2016 06:11 pm  ----------------------------------------------------------------------  [place] Mfm Ob Follow Up Addl Gest  Result Date: 08/10/2016 ----------------------------------------------------------------------  OBSTETRICS REPORT                      (Signed Final 08/10/2016 02:24 pm) ---------------------------------------------------------------------- Patient Info  ID #:       [id number]                         D.O.B.:   1985/05/15 (31 yrs)  Name:       Kathy Sandoval                Visit Date:  08/10/2016 12:45 pm ---------------------------------------------------------------------- Performed By  Performed By:     Ramond Craver Tester BS,       Ref. Address:     [address], [place]                                                             Road  [place], [place]                                                             [address]  Attending:        Charlsie Merles MD         Location:         [facility]  Referred By:      Catalina Antigua MD ---------------------------------------------------------------------- Orders   #  Description                                 Code   1  [place] MFM OB FOLLOW UP                         60454.09   2  [place] MFM OB FOLLOW UP ADDL GEST               81191.47   3  [place] MFM FETAL BPP WO NON STRESS              76819.01   4  [place] MFM FETAL BPP WO NST ADDL                82956.2      [place]  ----------------------------------------------------------------------   #  Ordered By               Order #        Accession #    Episode #   1  Particia Nearing            [id number]      [id number]     [id number]   2  MARTHA DECKER            [id number]      [id number]     [id number]   3  TANYA PRATT              [id number]      [id number]     [id number]   4  TANYA PRATT              [id number]      [id number]     [id number]  ---------------------------------------------------------------------- Indications   [redacted] weeks gestation of  pregnancy                Z3A.51   Twin pregnancy, di/di, third trimester         O30.043   Obesity complicating pregnancy, third          O99.213   trimester   Abnormal biochemical screen (quad) for         O28.9   Trisomy 21-low risk NIPS   Pre-existing diabetes, type 2, in pregnancy,   O24.113   third trimester (Metformin, Glyburide)   Encounter for other antenatal screening        Z36.2   follow-up   Decreased fetal movements, third trimester,    O36.8130   unspecified  ---------------------------------------------------------------------- OB History  Gravidity:    3         Term:   2  Prem:   0        SAB:   0  TOP:          0       Ectopic:  0        Living: 2 ---------------------------------------------------------------------- Fetal Evaluation (Fetus A)  Num Of Fetuses:     2  Fetal Heart         158  Rate(bpm):  Cardiac Activity:   Observed  Fetal Lie:          Lower Left Fetus  Presentation:       Cephalic  Placenta:           Anterior, above cervical os  P. Cord Insertion:  Previously Visualized  Amniotic Fluid  AFI FV:      Subjectively within normal limits                              Largest Pocket(cm)                              6.62 ---------------------------------------------------------------------- Biophysical Evaluation (Fetus A)  Amniotic F.V:   Within normal limits       F. Tone:        Observed  F. Movement:    Observed                   Score:          6/8  F. Breathing:   Not Observed ---------------------------------------------------------------------- Biometry (Fetus A)  BPD:      87.2  mm     G. Age:  35w 1d         62  %    CI:        78.37   %   70 - 86                                                          FL/HC:      22.7   %   20.1 - 22.3  HC:      311.6  mm     G. Age:  34w 6d         18  %    HC/AC:      0.95       0.93 - 1.11  AC:      327.9  mm     G. Age:  36w 5d         93  %    FL/BPD:     81.1   %   71 - 87  FL:       70.7  mm     G. Age:  36w 2d         77  %     FL/AC:      21.6   %   20 - 24  HUM:      56.2  mm     G. Age:  32w 5d         20  %  Est. FW:    2878  gm      6 lb 6 oz     82  %  FW Discordancy     0 \ 11 % ---------------------------------------------------------------------- Gestational Age (Fetus A)  LMP:           34w 6d       Date:   12/10/15                 EDD:   09/15/16  U/S Today:     35w 5d                                        EDD:   09/09/16  Best:          34w 6d    Det. By:   LMP  (12/10/15)          EDD:   09/15/16 ---------------------------------------------------------------------- Anatomy (Fetus A)  Cranium:               Previously seen        Aortic Arch:            Previously seen  Cavum:                 Previously seen        Ductal Arch:            Previously seen  Ventricles:            Previously seen        Diaphragm:              Appears normal  Choroid Plexus:        Previously seen        Stomach:                Appears normal, left                                                                        sided  Cerebellum:            Previously seen        Abdomen:                Previously seen  Posterior Fossa:       Previously seen        Abdominal Wall:         Previously seen  Nuchal Fold:           Not applicable (>20    Cord Vessels:           Previously seen                         wks GA)  Face:                  Orbits and profile     Kidneys:                Appear normal                         previously seen  Lips:                  Previously  seen        Bladder:                Appears normal  Thoracic:              Previously seen        Spine:                  Limited views                                                                        previously seen  Heart:                 Previously seen        Upper Extremities:      Previously seen  RVOT:                  Previously seen        Lower Extremities:      Previously seen  LVOT:                  Previously seen  Other:  Parents do not wish to know sex of  fetus. Fetus appears to be a female.          Technically difficult due to maternal habitus. ---------------------------------------------------------------------- Fetal Evaluation (Fetus B)  Num Of Fetuses:     2  Fetal Heart         168  Rate(bpm):  Cardiac Activity:   Observed  Fetal Lie:          Upper Right Fetus  Presentation:       Cephalic  Placenta:           Anterior, above cervical os  P. Cord Insertion:  Marginal insertion previously  Amniotic Fluid  AFI FV:      Subjectively within normal limits                              Largest Pocket(cm)                              4.4 ---------------------------------------------------------------------- Biophysical Evaluation (Fetus B)  Amniotic F.V:   Within normal limits       F. Tone:        Not Observed  F. Movement:    Observed                   Score:          4/8  F. Breathing:   Not Observed ---------------------------------------------------------------------- Biometry (Fetus B)  BPD:      80.6  mm     G. Age:  32w 2d          3  %    CI:        73.46   %   70 - 86  FL/HC:      22.8   %   20.1 - 22.3  HC:      298.8  mm     G. Age:  33w 1d        < 3  %    HC/AC:      0.94       0.93 - 1.11  AC:       319   mm     G. Age:  35w 6d         81  %    FL/BPD:     84.4   %   71 - 87  FL:         68  mm     G. Age:  34w 6d         45  %    FL/AC:      21.3   %   20 - 24  HUM:      57.7  mm     G. Age:  33w 3d         37  %  CER:        46  mm     G. Age:  N/A          > 95  %  Est. FW:    2548  gm    5 lb 10 oz      62  %     FW Discordancy        11  % ---------------------------------------------------------------------- Gestational Age (Fetus B)  LMP:           34w 6d       Date:   12/10/15                 EDD:   09/15/16  U/S Today:     34w 0d                                        EDD:   09/21/16  Best:          34w 6d    Det. By:   LMP  (12/10/15)          EDD:   09/15/16  ---------------------------------------------------------------------- Anatomy (Fetus B)  Cranium:               Previously seen        Aortic Arch:            Previously seen  Cavum:                 Previously seen        Ductal Arch:            Previously seen  Ventricles:            Previously seen        Diaphragm:              Appears normal  Choroid Plexus:        Previously seen        Stomach:                Appears normal, left  sided  Cerebellum:            Previously seen        Abdomen:                Previously seen  Posterior Fossa:       Previously seen        Abdominal Wall:         Previously seen  Nuchal Fold:           Not applicable (>20    Cord Vessels:           Previously seen                         wks GA)  Face:                  Orbits and profile     Kidneys:                Appear normal                         previously seen  Lips:                  Previously seen        Bladder:                Appears normal  Thoracic:              Previously seen        Spine:                  Previously seen  Heart:                 Previously seen        Upper Extremities:      Previously seen  RVOT:                  Previously seen        Lower Extremities:      Previously seen  LVOT:                  Previously seen  Other:  Parents do not wish to know sex of fetus. Fetus appears to be a          female. Nasal bone previously visualized. Technically difficult due to          maternal habitus and fetal position. ---------------------------------------------------------------------- Cervix Uterus Adnexa  Cervix  Not visualized (advanced GA >29wks)  Uterus  No abnormality visualized.  Left Ovary  No adnexal mass visualized.  Right Ovary  No adnexal mass visualized.  Cul De Sac:   No free fluid seen.  Adnexa:       No abnormality visualized. ---------------------------------------------------------------------- Impression   Dichorionic/diamniotic twin pregnancy at 34+6 weeks.  Reports decreased fetal movement x 2 days  Normal interval anatomy for both twins  Normal amniotic fluid volume for both twins with MVP 6.6cm  for A and 4.4cm for B  Appropriate interval growths with EFWs at the 82nd and 62nd  percentile, 11% discordance  BPPs are 6/8 for A and 4/8 for B (neither had breathing, B  had poor tone) ---------------------------------------------------------------------- Recommendations  To MAU. Recommend NST/overnight observation and repeat  BPP in AM ----------------------------------------------------------------------                 Charlsie Merles, MD Electronically Signed  Final Report   08/10/2016 02:24 pm ----------------------------------------------------------------------  [place] Mfm Ob Follow Up Addl Gest  Result Date: 07/13/2016 ----------------------------------------------------------------------  OBSTETRICS REPORT                      (Signed Final 07/13/2016 06:11 pm) ---------------------------------------------------------------------- Patient Info  ID #:       [id number]                          D.O.B.:  03/24/1985 (31 yrs)  Name:       Kathy Sandoval                 Visit Date: 07/12/2016 10:37 am ---------------------------------------------------------------------- Performed By  Performed By:     Tommie Raymond BS,       Ref. Address:     [address], [place]                                                             [address]                                                             [place], [place]                                                             [address]  Attending:        Particia Nearing MD       Location:         [facility]  Referred By:      Catalina Antigua MD ---------------------------------------------------------------------- Orders   #  Description                                 Code   1  [place] MFM OB FOLLOW UP                         [id number]   2  [place] MFM OB  FOLLOW UP ADDL GEST               62130.86  ----------------------------------------------------------------------   #  Ordered By               Order #        Accession #    Episode #   1  Particia Nearing            [id number]      [id number]     [id number]   2  MARTHA DECKER  [id number]      [id number]     [id number]  ---------------------------------------------------------------------- Indications   [redacted] weeks gestation of pregnancy                Z3A.30   Twin pregnancy, di/di, third trimester         O30.043   Obesity complicating pregnancy, third          O99.213   trimester   Abnormal biochemical screen (quad) for         O28.9   Trisomy 21-low risk NIPS   Pre-existing diabetes, type 2, in pregnancy,   O24.113   third trimester (Metformin, Glyburide)   Encounter for other antenatal screening        Z36.2   follow-up  ---------------------------------------------------------------------- OB History  Gravidity:    3         Term:   2        Prem:   0        SAB:   0  TOP:          0       Ectopic:  0        Living: 2 ---------------------------------------------------------------------- Fetal Evaluation (Fetus A)  Num Of Fetuses:     2  Fetal Heart         125  Rate(bpm):  Cardiac Activity:   Observed  Fetal Lie:          Lower Left Fetus  Presentation:       Breech  Placenta:           Anterior, above cervical os  P. Cord Insertion:  Previously Visualized  Amniotic Fluid  AFI FV:      Subjectively within normal limits                              Largest Pocket(cm)                              4.9 ---------------------------------------------------------------------- Biometry (Fetus A)  BPD:      80.9  mm     G. Age:  32w 3d         87  %    CI:        84.12   %    70 - 86                                                          FL/HC:      22.2   %    19.3 - 21.3  HC:       278   mm     G. Age:  30w 3d         11  %    HC/AC:      1.02        0.96 - 1.17  AC:      271.5  mm     G. Age:  31w 2d         62   %    FL/BPD:     76.3   %    71 - 87  FL:       61.7  mm     G. Age:  32w 0d         72  %    FL/AC:      22.7   %    20 - 24  HUM:      51.8  mm     G. Age:  30w 2d         43  %  CER:      36.9  mm     G. Age:  31w 6d         63  %  CM:        6.1  mm  Est. FW:    1777  gm    3 lb 15 oz      70  %     FW Discordancy      0 \ 9 % ---------------------------------------------------------------------- Gestational Age (Fetus A)  LMP:           30w 5d        Date:  12/10/15                 EDD:   09/15/16  U/S Today:     31w 4d                                        EDD:   09/09/16  Best:          30w 5d     Det. By:  LMP  (12/10/15)          EDD:   09/15/16 ---------------------------------------------------------------------- Anatomy (Fetus A)  Cranium:               Appears normal         Aortic Arch:            Previously seen  Cavum:                 Previously seen        Ductal Arch:            Previously seen  Ventricles:            Appears normal         Diaphragm:              Appears normal  Choroid Plexus:        Previously seen        Stomach:                Appears normal, left                                                                        sided  Cerebellum:            Appears normal         Abdomen:                Previously seen  Posterior Fossa:       Previously seen        Abdominal Wall:         Previously seen  Nuchal Fold:  Not applicable (>20    Cord Vessels:           Previously seen                         wks GA)  Face:                  Orbits and profile     Kidneys:                Appear normal                         previously seen  Lips:                  Previously seen        Bladder:                Appears normal  Thoracic:              Previously seen        Spine:                  Limited views                                                                        appear normal  Heart:                 Previously seen        Upper Extremities:      Previously seen  RVOT:                   Previously seen        Lower Extremities:      Previously seen  LVOT:                  Previously seen  Other:  Parents do not wish to know sex of fetus. Fetus appears to be a female.          Technically difficult due to maternal habitus. ---------------------------------------------------------------------- Fetal Evaluation (Fetus B)  Num Of Fetuses:     2  Fetal Heart         150  Rate(bpm):  Cardiac Activity:   Observed  Fetal Lie:          Upper Right Fetus  Presentation:       Transverse Oblique, head to maternal Rt  Placenta:           Anterior, above cervical os  P. Cord Insertion:  Marginal insertion previously  Amniotic Fluid  AFI FV:      Subjectively within normal limits                              Largest Pocket(cm)                              5.0 ---------------------------------------------------------------------- Biometry (Fetus B)  BPD:      74.5  mm     G. Age:  29w 6d  16  %    CI:         77.7   %    70 - 86                                                          FL/HC:      22.0   %    19.3 - 21.3  HC:      267.5  mm     G. Age:  29w 1d        < 3  %    HC/AC:      0.99        0.96 - 1.17  AC:      270.1  mm     G. Age:  31w 1d         58  %    FL/BPD:     78.9   %    71 - 87  FL:       58.8  mm     G. Age:  30w 5d         35  %    FL/AC:      21.8   %    20 - 24  HUM:      51.8  mm     G. Age:  30w 2d         43  %  Est. FW:    1618  gm      3 lb 9 oz     54  %     FW Discordancy         9  % ---------------------------------------------------------------------- Gestational Age (Fetus B)  LMP:           30w 5d        Date:  12/10/15                 EDD:   09/15/16  U/S Today:     30w 2d                                        EDD:   09/18/16  Best:          30w 5d     Det. By:  LMP  (12/10/15)          EDD:   09/15/16 ---------------------------------------------------------------------- Anatomy (Fetus B)  Cranium:               Appears normal         Aortic Arch:             Previously seen  Cavum:                 Previously seen        Ductal Arch:            Previously seen  Ventricles:            Appears normal         Diaphragm:              Appears normal  Choroid Plexus:        Previously seen  Stomach:                Appears normal, left                                                                        sided  Cerebellum:            Previously seen        Abdomen:                Previously seen  Posterior Fossa:       Previously seen        Abdominal Wall:         Previously seen  Nuchal Fold:           Not applicable (>20    Cord Vessels:           Previously seen                         wks GA)  Face:                  Orbits and profile     Kidneys:                Appear normal                         previously seen  Lips:                  Previously seen        Bladder:                Appears normal  Thoracic:              Previously seen        Spine:                  Previously seen  Heart:                 Previously seen        Upper Extremities:      Previously seen  RVOT:                  Previously seen        Lower Extremities:      Previously seen  LVOT:                  Previously seen  Other:  Parents do not wish to know sex of fetus.Fetus appears to be a          female. Nasal bone previously visualized. Technically difficult due to          maternal habitus. ---------------------------------------------------------------------- Cervix Uterus Adnexa  Cervix  Normal appearance by transabdominal scan.  Uterus  No abnormality visualized.  Left Ovary  Size(cm)     2.29   x   2.3    x  0.92      Vol(ml): 2.5  Within normal limits. No adnexal mass visualized.  Right Ovary  Size(cm)     2.58   x   1.89   x  1.3  Vol(ml): 3.3  Within normal limits. No adnexal mass visualized.  Cul De Sac:   No free fluid seen.  Adnexa:       No abnormality visualized. ---------------------------------------------------------------------- Impression   Dichorionic/diamniotic twin pregnancy at 30+5 weeks  Normal interval anatomy x 2; anatomic survey complete x 2  Normal amniotic fluid volume x 2  Appropriate interval growths with EFWs at the 70th and 54th  %tiles ---------------------------------------------------------------------- Recommendations  Recommend follow-up ultrasound examination in 4 weeks ----------------------------------------------------------------------                 Particia Nearing, MD Electronically Signed Final Report   07/13/2016 06:11 pm ----------------------------------------------------------------------  [place] Mfm Fetal Bpp Wo Nst Addl Gestation  Result Date: 08/10/2016 ----------------------------------------------------------------------  OBSTETRICS REPORT                      (Signed Final 08/10/2016 02:24 pm) ---------------------------------------------------------------------- Patient Info  ID #:       [id number]                         D.O.B.:   [birth year] (31 yrs)  Name:       Kathy Sandoval                Visit Date:  08/10/2016 12:45 pm ---------------------------------------------------------------------- Performed By  Performed By:     Ramond Craver Tester BS,       Ref. Address:     [address] [place]                                                             [address]                                                             [place], [place]                                                             [address]  Attending:        Charlsie Merles MD         Location:         [facility]  Referred By:      Catalina Antigua MD ---------------------------------------------------------------------- Orders   #  Description                                 Code   1  [place] MFM OB FOLLOW UP                         [id number]   2  [place] MFM OB FOLLOW UP ADDL GEST               [id number]   3  [place] MFM FETAL BPP WO NON STRESS              76819.01   4  [place] MFM FETAL BPP WO NST ADDL                40981.1      GESTATION   ----------------------------------------------------------------------   #  Ordered By               Order #        Accession #    Episode #   1  Particia Nearing            [id number]      [id number]     [id number]   2  MARTHA DECKER            [id number]      [id number]     [id number]   3  TANYA PRATT              [id number]      [id number]     [id number]   4  TANYA PRATT              [id number]      [id number]     [id number]  ---------------------------------------------------------------------- Indications   [redacted] weeks gestation of pregnancy                Z3A.7   Twin pregnancy, di/di, third trimester         O30.043   Obesity complicating pregnancy, third          O99.213   trimester   Abnormal biochemical screen (quad) for         O28.9   Trisomy 21-low risk NIPS   Pre-existing diabetes, type 2, in pregnancy,   O24.113   third trimester (Metformin, Glyburide)   Encounter for other antenatal screening        Z36.2   follow-up   Decreased fetal movements, third trimester,    O36.8130   unspecified  ---------------------------------------------------------------------- OB History  Gravidity:    3         Term:   2        Prem:   0        SAB:   0  TOP:          0       Ectopic:  0        Living: 2 ---------------------------------------------------------------------- Fetal Evaluation (Fetus A)  Num Of Fetuses:     2  Fetal Heart         158  Rate(bpm):  Cardiac Activity:   Observed  Fetal Lie:          Lower Left Fetus  Presentation:       Cephalic  Placenta:           Anterior, above cervical os  P. Cord Insertion:  Previously Visualized  Amniotic Fluid  AFI FV:      Subjectively within normal limits                              Largest Pocket(cm)  6.62 ---------------------------------------------------------------------- Biophysical Evaluation (Fetus A)  Amniotic F.V:   Within normal limits       F. Tone:        Observed  F. Movement:    Observed                   Score:          6/8  F.  Breathing:   Not Observed ---------------------------------------------------------------------- Biometry (Fetus A)  BPD:      87.2  mm     G. Age:  35w 1d         62  %    CI:        78.37   %   70 - 86                                                          FL/HC:      22.7   %   20.1 - 22.3  HC:      311.6  mm     G. Age:  34w 6d         18  %    HC/AC:      0.95       0.93 - 1.11  AC:      327.9  mm     G. Age:  36w 5d         93  %    FL/BPD:     81.1   %   71 - 87  FL:       70.7  mm     G. Age:  36w 2d         77  %    FL/AC:      21.6   %   20 - 24  HUM:      56.2  mm     G. Age:  32w 5d         20  %  Est. FW:    2878  gm      6 lb 6 oz     82  %     FW Discordancy     0 \ 11 % ---------------------------------------------------------------------- Gestational Age (Fetus A)  LMP:           34w 6d       Date:   12/10/15                 EDD:   09/15/16  U/S Today:     35w 5d                                        EDD:   09/09/16  Best:          34w 6d    Det. By:   LMP  (12/10/15)          EDD:   09/15/16 ---------------------------------------------------------------------- Anatomy (Fetus A)  Cranium:               Previously seen        Aortic Arch:            Previously seen  Cavum:  Previously seen        Ductal Arch:            Previously seen  Ventricles:            Previously seen        Diaphragm:              Appears normal  Choroid Plexus:        Previously seen        Stomach:                Appears normal, left                                                                        sided  Cerebellum:            Previously seen        Abdomen:                Previously seen  Posterior Fossa:       Previously seen        Abdominal Wall:         Previously seen  Nuchal Fold:           Not applicable (>20    Cord Vessels:           Previously seen                         wks GA)  Face:                  Orbits and profile     Kidneys:                Appear normal                          previously seen  Lips:                  Previously seen        Bladder:                Appears normal  Thoracic:              Previously seen        Spine:                  Limited views                                                                        previously seen  Heart:                 Previously seen        Upper Extremities:      Previously seen  RVOT:                  Previously seen        Lower  Extremities:      Previously seen  LVOT:                  Previously seen  Other:  Parents do not wish to know sex of fetus. Fetus appears to be a female.          Technically difficult due to maternal habitus. ---------------------------------------------------------------------- Fetal Evaluation (Fetus B)  Num Of Fetuses:     2  Fetal Heart         168  Rate(bpm):  Cardiac Activity:   Observed  Fetal Lie:          Upper Right Fetus  Presentation:       Cephalic  Placenta:           Anterior, above cervical os  P. Cord Insertion:  Marginal insertion previously  Amniotic Fluid  AFI FV:      Subjectively within normal limits                              Largest Pocket(cm)                              4.4 ---------------------------------------------------------------------- Biophysical Evaluation (Fetus B)  Amniotic F.V:   Within normal limits       F. Tone:        Not Observed  F. Movement:    Observed                   Score:          4/8  F. Breathing:   Not Observed ---------------------------------------------------------------------- Biometry (Fetus B)  BPD:      80.6  mm     G. Age:  32w 2d          3  %    CI:        73.46   %   70 - 86                                                          FL/HC:      22.8   %   20.1 - 22.3  HC:      298.8  mm     G. Age:  33w 1d        < 3  %    HC/AC:      0.94       0.93 - 1.11  AC:       319   mm     G. Age:  35w 6d         81  %    FL/BPD:     84.4   %   71 - 87  FL:         68  mm     G. Age:  34w 6d         45  %    FL/AC:      21.3   %   20 - 24  HUM:      57.7   mm     G. Age:  33w 3d         37  %  CER:        46  mm     G. Age:  N/A          > 95  %  Est. FW:    2548  gm    5 lb 10 oz      62  %     FW Discordancy        11  % ---------------------------------------------------------------------- Gestational Age (Fetus B)  LMP:           34w 6d       Date:   12/10/15                 EDD:   09/15/16  U/S Today:     34w 0d                                        EDD:   09/21/16  Best:          34w 6d    Det. By:   LMP  (12/10/15)          EDD:   09/15/16 ---------------------------------------------------------------------- Anatomy (Fetus B)  Cranium:               Previously seen        Aortic Arch:            Previously seen  Cavum:                 Previously seen        Ductal Arch:            Previously seen  Ventricles:            Previously seen        Diaphragm:              Appears normal  Choroid Plexus:        Previously seen        Stomach:                Appears normal, left                                                                        sided  Cerebellum:            Previously seen        Abdomen:                Previously seen  Posterior Fossa:       Previously seen        Abdominal Wall:         Previously seen  Nuchal Fold:           Not applicable (>20    Cord Vessels:           Previously seen                         wks GA)  Face:                  Orbits and profile     Kidneys:  Appear normal                         previously seen  Lips:                  Previously seen        Bladder:                Appears normal  Thoracic:              Previously seen        Spine:                  Previously seen  Heart:                 Previously seen        Upper Extremities:      Previously seen  RVOT:                  Previously seen        Lower Extremities:      Previously seen  LVOT:                  Previously seen  Other:  Parents do not wish to know sex of fetus. Fetus appears to be a          female. Nasal bone previously visualized.  Technically difficult due to          maternal habitus and fetal position. ---------------------------------------------------------------------- Cervix Uterus Adnexa  Cervix  Not visualized (advanced GA >29wks)  Uterus  No abnormality visualized.  Left Ovary  No adnexal mass visualized.  Right Ovary  No adnexal mass visualized.  Cul De Sac:   No free fluid seen.  Adnexa:       No abnormality visualized. ---------------------------------------------------------------------- Impression  Dichorionic/diamniotic twin pregnancy at 34+6 weeks.  Reports decreased fetal movement x 2 days  Normal interval anatomy for both twins  Normal amniotic fluid volume for both twins with MVP 6.6cm  for A and 4.4cm for B  Appropriate interval growths with EFWs at the 82nd and 62nd  percentile, 11% discordance  BPPs are 6/8 for A and 4/8 for B (neither had breathing, B  had poor tone) ---------------------------------------------------------------------- Recommendations  To MAU. Recommend NST/overnight observation and repeat  BPP in AM ----------------------------------------------------------------------                 Charlsie Merles, MD Electronically Signed Final Report   08/10/2016 02:24 pm ----------------------------------------------------------------------    Assessment and Plan: Patient Active Problem List   Diagnosis Date Noted  . Abnormal BPP; Twin B 4/8 08/10/2016  . Abnormal quad screen   . GBS bacteriuria 02/19/2016  . Rubella non-immune status, antepartum 02/19/2016  . Supervision of high risk pregnancy in first trimester 02/17/2016  . Dichorionic diamniotic twin pregnancy, antepartum 02/17/2016  . Type 2 diabetes mellitus in pregnancy 02/08/2016  . Obesity 04/12/2010  . MIGRAINE, UNSPEC., W/O INTRACTABLE MIGRAINE 04/27/2006   Admit to Antenatal Prolonged fetal monitoring x 2 ordered; repeat BPP ordered at 2000 and at 0800 tomorrow Will keep NPO until her next BPP; if Twin B still 4/10 or if testing  worsens, will need delivery. Patient informed that she will likely need cesarean delivery if this occurs. Betamethasone x 2 doses; will monitor her CBGs closely.  Metformin and Glyburide ordered, may need to adjust dosage for increased CBGs after betamethasone. NICU RN in Technical sales engineer on  call aware of this admission Routine antenatal care  Jaynie Collins, MD, FACOG Attending Obstetrician & Gynecologist Faculty Practice, [facility]

## 2016-08-10 NOTE — ED Notes (Signed)
[place] called to [place], Charity fundraiserN.  Pt to MAU for fetal monitoring.

## 2016-08-10 NOTE — Op Note (Signed)
Preoperative diagnosis:  1.  Intrauterine pregnancy at [redacted]w[redacted]d  weeks gestation                                         2.  Diamniotic dichorionic twins                                         3.  Placental abruption with rupture of membranes of twin a   Postoperative diagnosis:  Same as above plus evolving large placental abruption  Procedure:  Primary cesarean section  Surgeon:  Lazaro Arms MD  Assistant:    Anesthesia: General Endotracheal anesthesia  Findings:  I was called by the high risk OB unit at  1824 because of sudden onset of vaginal bleeding.  I immediately went up and evaluated the patient and the nursing staff was starting an IV.  She had light colored blood on the sheets and between her legs with one small clot adherent.  It basically looks like rupture membranes with blood inside the amniotic fluid.  Both fetal heart rates were in the 150s.  Again they were getting IV started and I contacted anesthesia and the cesarean section OR team and stated we were going to do a stat C-section.  The patient was brought down promptly and the OR staff did a quick count.  At that time the patient was given be set up for spinal but she had a large amount of bleeding coming from her vagina it was a stent dramatic change from what was going on upstairs in her room.  She was literally spewing blood from the vagina it was significant and dramatic.  As a result we switched to general anesthesia did a splash Betadine prep and proceeded with the surgery.  Over a low transverse incision was delivered Twin A viable female with Apgars of 8 and 9 weighing 5 lbs. 12 oz.  CORD gas had a pH of 7.32. Twin B was a female and delivered at 51 with Apgars of 1 and 7 weighing 4 pounds and 14.7 ounces.CORD gas had a pH of 7.27  Uterus, tubes and ovaries were all normal.  There was definitive evidence of a placental abruption probably at least 50% of twin A's placenta which is surprising given the Apgars and the  biophysical profiles that were done earlier in the day.  Twin B's biophysical profile was 4 out of 8.  But her placenta was fine twin A's placenta was the 50% abruption with approximately 250 cc of adherent clot.    There were no other significant findings  After delivery of the placentas were which were sent to pathology for evaluation the uterus firmed up pretty well but I gave her Methergine and she had 1 area of the uterus that was quite atonic and I gave her intramyometrial Hemabate 1000 g which resulted in excellent uterine tone throughout.    Description of operation:  Again the patient was taken to the operating room and a stat fashion and the plan was undergo a spinal anesthetic.  However she had a dramatic increase in her bleeding and we switched to a general anesthetic.  A splash prep was performed.  She was draped in the usual fashion. A Pfannenstiel skin incision was made and carried down sharply to the  rectus fascia which was scored in the midline extended laterally. The fascia was taken off the muscles both superiorly and without difficulty. The muscles were divided.  The peritoneal cavity was entered.  Bladder blade was placed, no bladder flap was created.  A low transverse hysterotomy incision was made and delivered twin A a viable female  infant at [id number] with Apgars of 8 and 9 weighing5 lbs 12 oz.  Cord pH was obtained and was 7.32.  Twin B was a female andwas then delivered at 1844 with Apgars of 1 and 7, she was also vertex.  Cord pH was 7.27.  The uterus was exteriorized. It was closed in 2 layers, the first being a running interlocking layer and the second being an imbricating layer using 0 monocryl on a CTX needle. There was good resulting hemostasis   Because of the placental abruption now is acutely aware of the possibility of a [place] uterus and gave the patient IM Methergine as well as 1000 g of intramyometrial Hemabate.  This resulted in the uterus contracting down nicely and  there was no evidence of [place] uterus when I closed the abdomen.  Likewise the subcutaneous tissue was not easy and there did not appear to be a DIC type picture.  The uterus tubes and ovaries were all normal. Peritoneal cavity was irrigated vigorously. The muscles and peritoneum were reapproximated loosely. The fascia was closed using 0 Vicryl in running fashion. Subcutaneous tissue was made hemostatic and irrigated. The skin was closed using skin staplesas I was concerned about the possibility of a postoperative hematoma or infection because of the splash prep and the possibility of the patient developing DIC.  Blood loss was almost impossible to determine with her bleeding vigorously prior to the incision.   Blood loss for the procedure was 1500 cc. The patient received a 2 gram of Ancef prophylactically. The patient was taken to the recovery room in good stable condition with all counts being correct x3.  EBL 1500 cc  Mylisa Brunson H 08/10/2016 7:24 PM

## 2016-08-10 NOTE — Anesthesia Procedure Notes (Signed)
Procedure Name: Intubation Date/Time: 08/10/2016 6:41 PM Performed by: Elgie CongoMALINOVA, Marcellus Pulliam H Pre-anesthesia Checklist: Patient identified, Emergency Drugs available, Suction available and Patient being monitored Patient Re-evaluated:Patient Re-evaluated prior to inductionOxygen Delivery Method: Circle system utilized Preoxygenation: Pre-oxygenation with 100% oxygen Intubation Type: IV induction Laryngoscope Size: Glidescope Grade View: Grade I Tube type: Oral Tube size: 7.0 mm Number of attempts: 1 Airway Equipment and Method: Rigid stylet Placement Confirmation: ETT inserted through vocal cords under direct vision,  positive ETCO2 and breath sounds checked- equal and bilateral Secured at: 21 cm Tube secured with: Tape Dental Injury: Teeth and Oropharynx as per pre-operative assessment

## 2016-08-10 NOTE — ED Notes (Signed)
Pt reports no fetal movement since 08/09/16.

## 2016-08-10 NOTE — Transfer of Care (Signed)
Immediate Anesthesia Transfer of Care Note  Patient: Kathy Sandoval  Procedure(s) Performed: Procedure(s): CESAREAN SECTION (N/A)  Patient Location: PACU  Anesthesia Type:General  Level of Consciousness: awake, alert  and oriented  Airway & Oxygen Therapy: Patient Spontanous Breathing and Patient connected to nasal cannula oxygen  Post-op Assessment: Report given to RN, Post -op Vital signs reviewed and stable and Patient moving all extremities  Post vital signs: Reviewed and stable  Last Vitals:  Vitals:   08/10/16 1510  BP: 126/68  Pulse: 80  Resp: 20  Temp: 36.5 C    Last Pain:  Vitals:   08/10/16 1510  TempSrc: Oral         Complications: No apparent anesthesia complications

## 2016-08-10 NOTE — Anesthesia Preprocedure Evaluation (Signed)
Anesthesia Evaluation  Patient identified by MRN, date of birth, ID band Patient awake    Reviewed: Allergy & PrecautionsPreop documentation limited or incomplete due to emergent nature of procedure.  Airway Mallampati: III  TM Distance: >3 FB Neck ROM: Full    Dental no notable dental hx.    Pulmonary former smoker,    Pulmonary exam normal breath sounds clear to auscultation       Cardiovascular negative cardio ROS Normal cardiovascular exam Rhythm:Regular Rate:Normal     Neuro/Psych negative neurological ROS  negative psych ROS   GI/Hepatic negative GI ROS, Neg liver ROS,   Endo/Other  diabetes, GestationalMorbid obesity  Renal/GU negative Renal ROS     Musculoskeletal negative musculoskeletal ROS (+)   Abdominal (+) + obese,   Peds  Hematology  (+) anemia ,   Anesthesia Other Findings   Reproductive/Obstetrics (+) Pregnancy                             Anesthesia Physical Anesthesia Plan  ASA: III and emergent  Anesthesia Plan: General   Post-op Pain Management:    Induction: Intravenous, Cricoid pressure planned and Rapid sequence  PONV Risk Score and Plan:   Airway Management Planned: LMA  Additional Equipment:   Intra-op Plan:   Post-operative Plan: Extubation in OR  Informed Consent: I have reviewed the patients History and Physical, chart, labs and discussed the procedure including the risks, benefits and alternatives for the proposed anesthesia with the patient or authorized representative who has indicated his/her understanding and acceptance.   Dental advisory given  Plan Discussed with: CRNA  Anesthesia Plan Comments:         Anesthesia Quick Evaluation

## 2016-08-11 ENCOUNTER — Encounter (HOSPITAL_COMMUNITY): Payer: Self-pay | Admitting: Obstetrics & Gynecology

## 2016-08-11 LAB — GLUCOSE, CAPILLARY: Glucose-Capillary: 78 mg/dL (ref 65–99)

## 2016-08-11 LAB — CBC
HEMATOCRIT: 18.7 % — AB (ref 36.0–46.0)
HEMOGLOBIN: 6.3 g/dL — AB (ref 12.0–15.0)
MCH: 28.1 pg (ref 26.0–34.0)
MCHC: 33.7 g/dL (ref 30.0–36.0)
MCV: 83.5 fL (ref 78.0–100.0)
Platelets: 173 10*3/uL (ref 150–400)
RBC: 2.24 MIL/uL — ABNORMAL LOW (ref 3.87–5.11)
RDW: 14.8 % (ref 11.5–15.5)
WBC: 11.6 10*3/uL — ABNORMAL HIGH (ref 4.0–10.5)

## 2016-08-11 LAB — RPR: RPR Ser Ql: NONREACTIVE

## 2016-08-11 MED ORDER — LACTATED RINGERS IV BOLUS (SEPSIS)
1000.0000 mL | Freq: Once | INTRAVENOUS | Status: AC
  Administered 2016-08-11: 1000 mL via INTRAVENOUS

## 2016-08-11 MED ORDER — SODIUM CHLORIDE 0.9 % IV SOLN
510.0000 mg | Freq: Once | INTRAVENOUS | Status: AC
  Administered 2016-08-11: 510 mg via INTRAVENOUS
  Filled 2016-08-11: qty 17

## 2016-08-11 NOTE — Anesthesia Postprocedure Evaluation (Signed)
Anesthesia Post Note  Patient: Kathy Sandoval  Procedure(s) Performed: Procedure(s) (LRB): CESAREAN SECTION (N/A)     Patient location during evaluation: PACU Anesthesia Type: General Level of consciousness: sedated and patient cooperative Pain management: pain level controlled Vital Signs Assessment: post-procedure vital signs reviewed and stable Respiratory status: spontaneous breathing Cardiovascular status: stable Anesthetic complications: no    Last Vitals:  Vitals:   08/10/16 2303 08/11/16 0209  BP: (!) 147/86 124/64  Pulse: 90   Resp:  20  Temp: 36.9 C 36.8 C    Last Pain:  Vitals:   08/11/16 0209  TempSrc: Oral  PainSc:    Pain Goal: Patients Stated Pain Goal: 1 (08/10/16 2200)               Lewie LoronJohn Kishana Battey

## 2016-08-11 NOTE — Progress Notes (Signed)
Subjective: Postpartum Day 1: Cesarean Delivery Patient reports incisional pain and tolerating PO.    Objective: Vital signs in last 24 hours: Temp:  [97.7 F (36.5 C)-98.7 F (37.1 C)] 98.7 F (37.1 C) (06/14 0540) Pulse Rate:  [77-103] 90 (06/14 0540) Resp:  [18-23] 20 (06/14 0540) BP: (117-147)/(64-96) 125/71 (06/14 0540) SpO2:  [97 %-100 %] 97 % (06/14 0540) Weight:  [265 lb 3.2 oz (120.3 kg)] 265 lb 3.2 oz (120.3 kg) (06/13 1245)  Physical Exam:  General: alert, cooperative and no distress Lochia: appropriate Uterine Fundus: firm Incision: dressing dry DVT Evaluation: No evidence of DVT seen on physical exam.   Recent Labs  08/10/16 2026 08/11/16 0542  HGB 8.5* 6.6*  HCT 25.9* 19.7*    Assessment/Plan: Status post Cesarean section. Doing well postoperatively.  Recheck H/H this evening, give IV iron, LR fluid bolus then D/C IV  EURE,LUTHER H 08/11/2016, 7:54 AM  Patient ID: Kathy Sandoval, female   DOB: 03/21/1985, 31 y.o.   MRN: [id number]

## 2016-08-11 NOTE — Progress Notes (Signed)
Critical Hgb of 6.3 called to Dr. Debroah LoopArnold. No no orders given.  Results for Kathy Sandoval, Kathy Sandoval (MRN [id number]) as of 08/11/2016 18:32  Ref. Range 08/11/2016 18:11  WBC Latest Ref Range: 4.0 - 10.5 K/uL 11.6 (H)  RBC Latest Ref Range: 3.87 - 5.11 MIL/uL 2.24 (L)  Hemoglobin Latest Ref Range: 12.0 - 15.0 g/dL 6.3 (LL)  HCT Latest Ref Range: 36.0 - 46.0 % 18.7 (L)  MCV Latest Ref Range: 78.0 - 100.0 fL 83.5  MCH Latest Ref Range: 26.0 - 34.0 pg 28.1  MCHC Latest Ref Range: 30.0 - 36.0 g/dL 46.933.7  RDW Latest Ref Range: 11.5 - 15.5 % 14.8  Platelets Latest Ref Range: 150 - 400 K/uL 173

## 2016-08-11 NOTE — Progress Notes (Signed)
Dr. Despina HiddenEure  Of pt h and h and urine output for 7 hrs 175.

## 2016-08-11 NOTE — Anesthesia Postprocedure Evaluation (Signed)
Anesthesia Post Note  Patient: Danella Deisorma Fenlon  Procedure(s) Performed: Procedure(s) (LRB): CESAREAN SECTION (N/A)     Patient location during evaluation: Mother Baby Anesthesia Type: General Level of consciousness: awake and awake and alert Pain management: pain level controlled Vital Signs Assessment: post-procedure vital signs reviewed and stable Respiratory status: spontaneous breathing Cardiovascular status: blood pressure returned to baseline Postop Assessment: no headache, patient able to bend at knees, no backache, no signs of nausea or vomiting and adequate PO intake Anesthetic complications: no    Last Vitals:  Vitals:   08/11/16 0209 08/11/16 0540  BP: 124/64 125/71  Pulse:  90  Resp: 20 20  Temp: 36.8 C 37.1 C    Last Pain:  Vitals:   08/11/16 0645  TempSrc:   PainSc: Asleep   Pain Goal: Patients Stated Pain Goal: 1 (08/10/16 2318)               Salome ArntSterling, Irisha Grandmaison Marie

## 2016-08-11 NOTE — Lactation Note (Signed)
This note was copied from a baby's chart. Lactation Consultation Note  Patient Name: Kathy Sandoval Today's Date: 08/11/2016 Reason for consult: Initial assessment;NICU baby;Infant < 6lbs;Multiple gestation Breastfeeding consultation services and support information given.  Providing Breastmilk for Your Baby in NICU booklet also given.  Mom has initiated pumping with symphony pump and obtained drops.  Instructed to pump and hand express 8-12 times/24 hours.  [facility].  Mom is motivated to provide milk for her babies and breastfed her first for 18 months.  Maternal Data Has patient been taught Hand Expression?: Yes Does the patient have breastfeeding experience prior to this delivery?: Yes  Feeding Feeding Type: Formula Nipple Type: Slow - flow  LATCH Score/Interventions                      Lactation Tools Discussed/Used WIC Program: Yes Pump Review: Setup, frequency, and cleaning;Milk Storage Initiated by:: RN Date initiated:: 08/10/16   Consult Status Consult Status: Follow-up Date: 08/12/16 Follow-up type: In-patient    Huston FoleyMOULDEN, Chavie Kolinski S 08/11/2016, 11:38 AM

## 2016-08-11 NOTE — Progress Notes (Signed)
CSW acknowledges NICU admission.   Patient screened out for psychosocial assessment since none of the following apply:  Psychosocial stressors documented in mother or baby's chart  Gestation less than 32 weeks  Code at delivery   Infant with anomalies  Please contact the Clinical Social Worker if specific needs arise, or by MOB's request.  Vylet Maffia, MSW, LCSW-A Clinical Social Worker  [facility]  Office: [phone]  

## 2016-08-12 ENCOUNTER — Encounter (HOSPITAL_COMMUNITY): Payer: Self-pay | Admitting: Obstetrics & Gynecology

## 2016-08-12 LAB — CBC
HCT: 17.8 % — ABNORMAL LOW (ref 36.0–46.0)
HEMATOCRIT: 19.7 % — AB (ref 36.0–46.0)
Hemoglobin: 6.6 g/dL — CL (ref 12.0–15.0)
Hemoglobin: 6 g/dL — CL (ref 12.0–15.0)
MCH: 27.5 pg (ref 26.0–34.0)
MCH: 28.3 pg (ref 26.0–34.0)
MCHC: 33.5 g/dL (ref 30.0–36.0)
MCHC: 33.7 g/dL (ref 30.0–36.0)
MCV: 82.1 fL (ref 78.0–100.0)
MCV: 84 fL (ref 78.0–100.0)
PLATELETS: 172 10*3/uL (ref 150–400)
Platelets: 159 10*3/uL (ref 150–400)
RBC: 2.4 MIL/uL — AB (ref 3.87–5.11)
RBC: 2.12 MIL/uL — AB (ref 3.87–5.11)
RDW: 14.5 % (ref 11.5–15.5)
RDW: 14.9 % (ref 11.5–15.5)
WBC: 12.4 10*3/uL — AB (ref 4.0–10.5)
WBC: 9.6 10*3/uL (ref 4.0–10.5)

## 2016-08-12 LAB — CBC WITH DIFFERENTIAL/PLATELET
Basophils Absolute: 0 10*3/uL (ref 0.0–0.1)
Basophils Relative: 0 %
EOS PCT: 1 %
Eosinophils Absolute: 0.1 10*3/uL (ref 0.0–0.7)
HCT: 23.2 % — ABNORMAL LOW (ref 36.0–46.0)
Hemoglobin: 7.8 g/dL — ABNORMAL LOW (ref 12.0–15.0)
LYMPHS PCT: 27 %
Lymphs Abs: 2.6 10*3/uL (ref 0.7–4.0)
MCH: 28.4 pg (ref 26.0–34.0)
MCHC: 33.6 g/dL (ref 30.0–36.0)
MCV: 84.4 fL (ref 78.0–100.0)
MONO ABS: 0.2 10*3/uL (ref 0.1–1.0)
Monocytes Relative: 3 %
Neutro Abs: 6.7 10*3/uL (ref 1.7–7.7)
Neutrophils Relative %: 69 %
PLATELETS: 199 10*3/uL (ref 150–400)
RBC: 2.75 MIL/uL — ABNORMAL LOW (ref 3.87–5.11)
RDW: 15.1 % (ref 11.5–15.5)
WBC: 9.7 10*3/uL (ref 4.0–10.5)

## 2016-08-12 MED ORDER — SODIUM CHLORIDE 0.9 % IV SOLN: Freq: Once | INTRAVENOUS | Status: DC

## 2016-08-12 NOTE — Lactation Note (Signed)
This note was copied from a baby's chart. Lactation Consultation Note  Patient Name: Kathy Sandoval Today's Date: 08/12/2016 Reason for consult: Follow-up assessment;NICU baby;Multiple gestation  NICU twins 7244 hours old. Mom reports that she is pumping every 3 hours, but knows to pump every 2-3 hours for a total of at least 8 times/24 hours followed by hand expression. Mom reports that she nursed first 2 children 18 and 15 months respectively. Discussed progression of milk coming to volume. [facility].  Maternal Data    Feeding Feeding Type: Donor Breast Milk Nipple Type: Slow - flow Length of feed: 30 min  LATCH Score/Interventions                      Lactation Tools Discussed/Used     Consult Status Consult Status: Follow-up Date: 08/13/16 Follow-up type: In-patient    Sherlyn HayJennifer D Naisha Wisdom 08/12/2016, 3:12 PM

## 2016-08-12 NOTE — Progress Notes (Signed)
Post OP DAY #2 Subjective: voiding, tolerating PO and She is dizzy with walking  Objective: Blood pressure 131/72, pulse 100, temperature 98 F (36.7 C), temperature source Oral, resp. rate 18, last menstrual period 12/10/2015, SpO2 100 %, unknown if currently breastfeeding.  Physical Exam:  General: alert Lochia: appropriate Uterine Fundus: firm Incision: Dressing c/d/i DVT Evaluation: No evidence of DVT seen on physical exam.   Recent Labs  08/11/16 1811 08/12/16 0520  HGB 6.3* 6.0*  HCT 18.7* 17.8*    Assessment/Plan: Plan for discharge tomorrow  Transfuse today   LOS: 2 days   Allie BossierMyra C Purcell Jungbluth 08/12/2016, 7:38 AM

## 2016-08-12 NOTE — Progress Notes (Signed)
CRITICAL VALUE ALERT  Critical Value: HBG 6.0  Date & Time Notied: 0600  Provider Notified: Vivianne MasterFran C CNM  Orders Received/Actions taken: NOTIFIED CNM no further orders

## 2016-08-13 LAB — CBC
HEMATOCRIT: 23.4 % — AB (ref 36.0–46.0)
HEMOGLOBIN: 7.8 g/dL — AB (ref 12.0–15.0)
MCH: 28.3 pg (ref 26.0–34.0)
MCHC: 33.3 g/dL (ref 30.0–36.0)
MCV: 84.8 fL (ref 78.0–100.0)
Platelets: 216 10*3/uL (ref 150–400)
RBC: 2.76 MIL/uL — ABNORMAL LOW (ref 3.87–5.11)
RDW: 15.3 % (ref 11.5–15.5)
WBC: 8.8 10*3/uL (ref 4.0–10.5)

## 2016-08-13 LAB — COMPREHENSIVE METABOLIC PANEL
ALBUMIN: 1.9 g/dL — AB (ref 3.5–5.0)
ALK PHOS: 108 U/L (ref 38–126)
ALT: 17 U/L (ref 14–54)
ANION GAP: 8 (ref 5–15)
AST: 30 U/L (ref 15–41)
BILIRUBIN TOTAL: 0.5 mg/dL (ref 0.3–1.2)
BUN: 11 mg/dL (ref 6–20)
CALCIUM: 8.2 mg/dL — AB (ref 8.9–10.3)
CO2: 23 mmol/L (ref 22–32)
Chloride: 110 mmol/L (ref 101–111)
Creatinine, Ser: 0.67 mg/dL (ref 0.44–1.00)
GFR calc non Af Amer: >60 mL/min (ref >60)
GLUCOSE: 203 mg/dL — AB (ref 65–99)
Potassium: 3.9 mmol/L (ref 3.5–5.1)
Sodium: 141 mmol/L (ref 135–145)
TOTAL PROTEIN: 5.1 g/dL — AB (ref 6.5–8.1)

## 2016-08-13 LAB — GLUCOSE, CAPILLARY
Glucose-Capillary: 165 mg/dL — ABNORMAL HIGH (ref 65–99)
Glucose-Capillary: 107 mg/dL — ABNORMAL HIGH (ref 65–99)

## 2016-08-13 MED ORDER — METFORMIN HCL 500 MG PO TABS
1000.0000 mg | ORAL_TABLET | Freq: Two times a day (BID) | ORAL | Status: DC
  Administered 2016-08-13 – 2016-08-14 (×2): 1000 mg via ORAL
  Filled 2016-08-13 (×4): qty 2

## 2016-08-13 MED ORDER — METFORMIN HCL 500 MG PO TABS
500.0000 mg | ORAL_TABLET | Freq: Two times a day (BID) | ORAL | Status: DC
  Filled 2016-08-13 (×2): qty 1

## 2016-08-13 MED ORDER — MEASLES, MUMPS & RUBELLA VAC ~~LOC~~ INJ
0.5000 mL | INJECTION | Freq: Once | SUBCUTANEOUS | Status: AC
  Administered 2016-08-13: 0.5 mL via SUBCUTANEOUS
  Filled 2016-08-13: qty 0.5

## 2016-08-13 MED ORDER — METFORMIN HCL 500 MG PO TABS
1000.0000 mg | ORAL_TABLET | Freq: Two times a day (BID) | ORAL | Status: DC
  Administered 2016-08-13: 1000 mg via ORAL
  Filled 2016-08-13: qty 2

## 2016-08-13 MED ORDER — SENNOSIDES-DOCUSATE SODIUM 8.6-50 MG PO TABS
2.0000 | ORAL_TABLET | Freq: Every evening | ORAL | Status: DC | PRN
  Filled 2016-08-13: qty 2

## 2016-08-13 NOTE — Lactation Note (Signed)
This note was copied from a baby's chart. Lactation Consultation Note  Patient Name: Kathy Sandoval Today's Date: 08/13/2016 Reason for consult: Follow-up assessment;NICU baby;Infant < 6lbs;Late preterm infant;Multiple gestation   Follow up with mom of 2170 hour old twins in NICU. Mom reports she was pumping and getting small gtts colostrum. She reports she is hand expressing post pumping. Enc mom to continue pumping every 2-3 hours followed by hand expression. Reviewed colostrum and normal progression of milk coming to volume. Mom without further questions/concerns at this time.    Maternal Data    Feeding Feeding Type: Donor Breast Milk Nipple Type: Slow - flow Length of feed: 30 min  LATCH Score/Interventions                      Lactation Tools Discussed/Used Pump Review: Setup, frequency, and cleaning Initiated by:: Reviewed and encouraged   Consult Status Consult Status: Follow-up Date: 08/14/16 Follow-up type: In-patient    [name] 08/13/2016, 4:49 PM

## 2016-08-13 NOTE — Progress Notes (Signed)
Daily Postpartum Note  Admission Date: 08/10/2016 Current Date: 08/13/2016 8:53 AM  Kathy Sandoval is a 31 y.o. [id number] POD#3 @ 34wks, s/p urgent pLTCS for abruption.  Pregnancy complicated by: Obesity, DM2, di-di twins  Overnight/24hr events:  Pt got two units PRBCs yesterday  Subjective:  No s/s of pre-eclampsia, feeling tired/a little dizzy when she first stands up but no chest pain, SOB. Meeting all post op goals, including flatus.   Objective:    Current Vital Signs 24h Vital Sign Ranges  T 98.2 F (36.8 C) Temp  Avg: 98.2 F (36.8 C)  Min: 97.9 F (36.6 C)  Max: 98.4 F (36.9 C)  BP (!) 141/73 BP  Min: 112/72  Max: 141/73  HR 75 Pulse  Avg: 95.9  Min: 75  Max: 112  RR 18 Resp  Avg: 18  Min: 16  Max: 20  SaO2 99 % Not Delivered SpO2  Avg: 99.5 %  Min: 99 %  Max: 100 %       24 Hour I/O Current Shift I/O  Time Ins Outs 06/15 0701 - 06/16 0700 In: 612  Out: -  No intake/output data recorded.   Patient Vitals for the past 24 hrs:  BP Temp Temp src Pulse Resp SpO2 Height Weight  08/13/16 0735 (!) 141/73 98.2 F (36.8 C) Oral 75 18 99 % - -  08/13/16 0651 - - - - - - 5' 5"  (1.651 m) 117.9 kg (260 lb)  08/13/16 0046 128/78 98.3 F (36.8 C) Oral (!) 112 20 100 % - -  08/12/16 1954 128/79 97.9 F (36.6 C) Oral (!) 104 18 - - -  08/12/16 1505 129/82 98.2 F (36.8 C) Oral 96 18 - - -  08/12/16 1245 116/72 98.4 F (36.9 C) Oral 92 18 - - -  08/12/16 1213 112/72 98.2 F (36.8 C) Oral 90 18 - - -  08/12/16 1004 118/70 98.2 F (36.8 C) Oral 99 16 - - -  08/12/16 0926 116/63 98.4 F (36.9 C) Oral 99 18 - - -    Physical exam: General: Well nourished, well developed female in no acute distress. Abdomen: obese, nttp, +BS, c/d/i dressing.  Cardiovascular: S1, S2 normal, no murmur, rub or gallop, regular rate and rhythm Respiratory: CTAB Extremities: no clubbing, cyanosis or edema Skin: Warm and dry.   Medications: Current Facility-Administered Medications   Medication Dose Route Frequency Provider Last Rate Last Dose  . 0.9 %  sodium chloride infusion   Intravenous Once Dove, Myra C, MD      . acetaminophen (TYLENOL) tablet 650 mg  650 mg Oral Q4H PRN Florian Buff, MD   650 mg at 08/12/16 0924  . coconut oil  1 application Topical PRN Florian Buff, MD      . witch hazel-glycerin (TUCKS) pad 1 application  1 application Topical PRN Florian Buff, MD       And  . dibucaine (NUPERCAINAL) 1 % rectal ointment 1 application  1 application Rectal PRN Florian Buff, MD      . diphenhydrAMINE (BENADRYL) capsule 25 mg  25 mg Oral Q6H PRN Florian Buff, MD   25 mg at 08/12/16 0924  . HYDROmorphone (DILAUDID) injection 0.25-0.5 mg  0.25-0.5 mg Intravenous Q5 min PRN Nolon Nations, MD   0.5 mg at 08/11/16 1751  . ibuprofen (ADVIL,MOTRIN) tablet 600 mg  600 mg Oral Q6H Florian Buff, MD   600 mg at 08/13/16 0649  . measles, mumps and  rubella vaccine (MMR) injection 0.5 mL  0.5 mL Subcutaneous Once Aletha Halim, MD      . menthol-cetylpyridinium (CEPACOL) lozenge 3 mg  1 lozenge Oral Q2H PRN Florian Buff, MD      . metFORMIN (GLUCOPHAGE) tablet 1,000 mg  1,000 mg Oral BID WC Aletha Halim, MD      . oxyCODONE-acetaminophen (PERCOCET/ROXICET) 5-325 MG per tablet 1 tablet  1 tablet Oral Q4H PRN Florian Buff, MD   1 tablet at 08/12/16 2006  . oxyCODONE-acetaminophen (PERCOCET/ROXICET) 5-325 MG per tablet 2 tablet  2 tablet Oral Q4H PRN Florian Buff, MD   2 tablet at 08/12/16 1534  . prenatal multivitamin tablet 1 tablet  1 tablet Oral Q1200 Florian Buff, MD   1 tablet at 08/12/16 1534  . senna-docusate (Senokot-S) tablet 2 tablet  2 tablet Oral QHS PRN Aletha Halim, MD      . simethicone (MYLICON) chewable tablet 80 mg  80 mg Oral TID PC Florian Buff, MD   80 mg at 08/13/16 3846  . Tdap (BOOSTRIX) injection 0.5 mL  0.5 mL Intramuscular Once Florian Buff, MD        Labs:   Recent Labs Lab 08/11/16 1811 08/12/16 0520  08/12/16 1905  WBC 11.6* 9.6 9.7  HGB 6.3* 6.0* 7.8*  HCT 18.7* 17.8* 23.2*  PLT 173 172 199     Recent Labs Lab 08/10/16 1630  NA 136  K 4.2  CL 106  CO2 21*  BUN 10  CREATININE 0.61  CALCIUM 8.7*  PROT 6.4*  BILITOT 0.5  ALKPHOS 196*  ALT 10*  AST 16  GLUCOSE 68    Radiology: no new imaging  Assessment & Plan:  Pt stable *Postpartum:routine care. Interested in [place]. D/w her that will d/w pt more at [facility] visit in a week or two. MMR ordered *DM2: pt on bid metformin at NOB and not on anything PP and not getting CBGs; she was on metformin and glyburide prior to delivery. Metformin re ordered at only 500 bid and not 1000 since she is pumping.  *PPx: OOB, SCDs *FEN/GI: changed to DM diet. *Anemia: re ordered CBC and d/w pt to try and simulate home activities; d/w her may need more blood. Pt put on po iron too *?HTN: cmp ordered and will continue to follow.  *Dispo: probably tomorrow  Durene Romans MD Attending Center for [facility])

## 2016-08-14 DIAGNOSIS — O165 Unspecified maternal hypertension, complicating the puerperium: Secondary | ICD-10-CM

## 2016-08-14 HISTORY — DX: Unspecified maternal hypertension, complicating the puerperium: O16.5

## 2016-08-14 LAB — TYPE AND SCREEN
ABO/RH(D): O POS
ANTIBODY SCREEN: NEGATIVE
UNIT DIVISION: 0
UNIT DIVISION: 0
UNIT DIVISION: 0
Unit division: 0
Unit division: 0
Unit division: 0

## 2016-08-14 LAB — BPAM RBC
BLOOD PRODUCT EXPIRATION DATE: 201807122359
BLOOD PRODUCT EXPIRATION DATE: 201807172359
Blood Product Expiration Date: 201807122359
Blood Product Expiration Date: 201807152359
Blood Product Expiration Date: 201807152359
Blood Product Expiration Date: 201807172359
ISSUE DATE / TIME: 201806150932
ISSUE DATE / TIME: 201806151223
ISSUE DATE / TIME: 201806151359
ISSUE DATE / TIME: 201806151733
UNIT TYPE AND RH: 5100
UNIT TYPE AND RH: 5100
UNIT TYPE AND RH: 5100
Unit Type and Rh: 5100
Unit Type and Rh: 5100
Unit Type and Rh: 5100

## 2016-08-14 LAB — GLUCOSE, CAPILLARY
Glucose-Capillary: 88 mg/dL (ref 65–99)
Glucose-Capillary: 104 mg/dL — ABNORMAL HIGH (ref 65–99)

## 2016-08-14 MED ORDER — OXYCODONE-ACETAMINOPHEN 5-325 MG PO TABS: 1.0000 | ORAL_TABLET | Freq: Four times a day (QID) | ORAL | 0 refills | Status: DC | PRN

## 2016-08-14 MED ORDER — IBUPROFEN 600 MG PO TABS: 600.0000 mg | ORAL_TABLET | Freq: Four times a day (QID) | ORAL | 1 refills | Status: DC | PRN

## 2016-08-14 MED ORDER — DOCUSATE SODIUM 100 MG PO CAPS: 100.0000 mg | ORAL_CAPSULE | Freq: Two times a day (BID) | ORAL | 1 refills | Status: DC

## 2016-08-14 MED ORDER — FERROUS GLUCONATE 324 (38 FE) MG PO TABS: 324.0000 mg | ORAL_TABLET | Freq: Two times a day (BID) | ORAL | 0 refills | Status: DC

## 2016-08-14 NOTE — Discharge Summary (Signed)
Obstetrical Discharge Summary  Date of Admission: 08/10/2016 Date of Discharge: 08/14/2016  Primary OB: [facility]  Gestational Age at Delivery: [redacted]w[redacted]d   Antepartum complications: DM2, BMI 85, Di-Di twins Reason for Admission: non reassuring BPP, abruption Date of Delivery: 08/10/2016  Delivered By: Duane Lope, MD Delivery Type: stat pLTCS Intrapartum complications/course: Abruptio Placenta, PPH Anesthesia: general Placenta: Delivered and expressed via active management. Intact: yes. To pathology: yes.  Laceration: n/a Episiotomy: n/a  EBL: Baby:  Female 2610g, APGAR 8/9 Female 2230g, APGAR 1/7   Discharge Diagnosis: Delivered. New dx of gHTN  Postpartum course:  *PP: routine course *DM2: pt was on no meds and wasn't ordered for CBGs until POD#3 and she was put on metformin 1000/1000 and she had adequate BS control.  Results for Kathy, Sandoval (MRN [id number]) as of 08/16/2016 09:55  Ref. Range 08/10/2016 20:22 08/13/2016 10:22 08/13/2016 23:00 08/14/2016 05:48 08/14/2016 11:37  Glucose-Capillary Latest Ref Range: 65 - 99 mg/dL 78 409 (H) 811 (H) 88 914 (H)   *Anemia: she received two units PRBC during her hospitalization and had no s/s of anemia on discharge.   CBC Latest Ref Rng & Units 08/13/2016 08/12/2016 08/12/2016  WBC 4.0 - 10.5 K/uL 8.8 9.7 9.6  Hemoglobin 12.0 - 15.0 g/dL 7.8(L) 7.8(L) 6.0(LL)  Hematocrit 36.0 - 46.0 % 23.4(L) 23.2(L) 17.8(L)  Platelets 150 - 400 K/uL 216 199 172    Discharge Vital Signs:  Current Vital Signs 24h Vital Sign Ranges  T 98.1 F (36.7 C) Temp  Avg: 98.5 F (36.9 C)  Min: 98.1 F (36.7 C)  Max: 98.8 F (37.1 C)  BP 123/78 BP  Min: 120/76  Max: 140/79  HR 88 Pulse  Avg: 91.3  Min: 87  Max: 95  RR 18 Resp  Avg: 19  Min: 18  Max: 20  SaO2 97 % Not Delivered SpO2  Avg: 98.3 %  Min: 97 %  Max: 100 %       24 Hour I/O Current Shift I/O  Time Ins Outs No intake/output data recorded. No intake/output data  recorded.   Discharge Exam:  General: Well nourished, well developed female in no acute distress. Abdomen: obese, nttp, +BS, c/d/i dressing.  Cardiovascular: S1, S2 normal, no murmur, rub or gallop, regular rate and rhythm Respiratory: CTAB Extremities: no clubbing, cyanosis or edema Skin: Warm and dry.   Disposition: Home  Rh Immune globulin given: not applicable Rubella vaccine given: not applicable Tdap vaccine given in AP or PP setting: ordered  Contraception: patient interested in BTL  Plan:  Kathy Sandoval was discharged to home in good condition. Follow-up appointment with a PP in 4 weeks  Future Appointments Date Time Provider Department Center  09/22/2016 8:20 AM Rasch, Harolyn Rutherford, NP [facility]    Discharge Medications: Allergies as of 08/14/2016   No Known Allergies     Medication List    STOP taking these medications   aspirin EC 81 MG tablet   glyBURIDE 2.5 MG tablet Commonly known as:  DIABETA     TAKE these medications   ACCU-CHEK SOFTCLIX LANCETS lancets U UTD   docusate sodium 100 MG capsule Commonly known as:  COLACE Take 1 capsule (100 mg total) by mouth 2 (two) times daily. What changed:  when to take this  reasons to take this   ferrous gluconate 324 MG tablet Commonly known as:  FERGON Take 1 tablet (324 mg total) by mouth 2 (two) times daily with a meal.  glucose blood test strip Commonly known as:  ACCU-CHEK AVIVA PLUS Use as instructed   ibuprofen 600 MG tablet Commonly known as:  ADVIL,MOTRIN Take 1 tablet (600 mg total) by mouth every 6 (six) hours as needed.   metFORMIN 500 MG tablet Commonly known as:  GLUCOPHAGE Take 1 tablet (500 mg total) by mouth 2 (two) times daily with a meal.   oxyCODONE-acetaminophen 5-325 MG tablet Commonly known as:  PERCOCET/ROXICET Take 1-2 tablets by mouth every 6 (six) hours as needed.   Prenatal Vitamins 0.8 MG tablet Take 1 tablet by mouth daily.       Cornelia Copaharlie Vincie Linn, Jr.  MD Attending [facility](Faculty Practice)

## 2016-08-14 NOTE — Lactation Note (Signed)
This note was copied from a baby's chart. Lactation Consultation Note  Patient Name: Boy A Danella Deisorma Apt Today's Date: 08/14/2016     Ardyth HarpsHernandez Twins 4687 hours old.  Mother is Ex BF for 18 months with first child.  Her breasts are filling. She brought approx 9-10 ml of colostrum to NICU today. Taught mother how to use manual pump and how to convert DEBP to manual. Reviewed engorgement care and importance of emptying breasts q 3 hours. Assisted with pumping and taught hands on pumping and referred her to video. Nipple seems to fill pumping flange, discussed with mother that if they become sore she may need to increase flange size. Provided labels for bottles and discussed milk storage and transportation.       Maternal Data    Feeding Feeding Type: Donor Breast Milk Nipple Type: Slow - flow Length of feed: 30 min  LATCH Score/Interventions                      Lactation Tools Discussed/Used     Consult Status      Hardie PulleyBerkelhammer, Montrice Montuori Boschen 08/14/2016, 10:30 AM

## 2016-08-14 NOTE — Discharge Instructions (Signed)
Keep checking your blood sugars when you go home   Hypertension During Pregnancy Hypertension is also called high blood pressure. High blood pressure means that the force of your blood moving in your body is too strong. When you are pregnant, this condition should be watched carefully. It can cause problems for you and your baby. Follow these instructions at home: Eating and drinking  Drink enough fluid to keep your pee (urine) clear or pale yellow.  Eat healthy foods that are low in salt (sodium). ? Do not add salt to your food. ? Check labels on foods and drinks to see much salt is in them. Look on the label where you see "Sodium." Lifestyle  Do not use any products that contain nicotine or tobacco, such as cigarettes and e-cigarettes. If you need help quitting, ask your doctor.  Do not use alcohol.  Avoid caffeine.  Avoid stress. Rest and get plenty of sleep. General instructions  Take over-the-counter and prescription medicines only as told by your doctor.  While lying down, lie on your left side. This keeps pressure off your baby.  While sitting or lying down, raise (elevate) your feet. Try putting some pillows under your lower legs.  Exercise regularly. Ask your doctor what kinds of exercise are best for you.  Keep all prenatal and follow-up visits as told by your doctor. This is important. Contact a doctor if:  You have symptoms that your doctor told you to watch for, such as: ? Fever. ? Throwing up (vomiting). ? Headache. Get help right away if:  You have very bad pain in your belly (abdomen).  You are throwing up, and this does not get better with treatment.  You suddenly get swelling in your hands, ankles, or face.  You gain 4 lb (1.8 kg) or more in 1 week.  You get bleeding from your vagina.  You have blood in your pee.  You do not feel your baby moving as much as normal.  You have a change in vision.  You have muscle twitching or sudden tightening  (spasms).  You have trouble breathing.  Your lips or fingernails turn blue. This information is not intended to replace advice given to you by your health care provider. Make sure you discuss any questions you have with your health care provider. Document Released: 03/19/2010 Document Revised: 10/27/2015 Document Reviewed: 10/27/2015 Elsevier Interactive Patient Education  2017 [employer]

## 2016-08-14 NOTE — Progress Notes (Signed)
Discharge instructions reviewed with patient.  Patient states understanding of home care, medications, activity, signs/symptoms to report to MD and return MD office visit.  Patients significant other and family will assist with her care @ home.  No home  equipment needed, patient has prescriptions and all personal belongings.  Patient discharged via wheelchair in stable condition with staff without incident.  

## 2016-08-17 ENCOUNTER — Other Ambulatory Visit: Payer: Medicaid Other | Admitting: Obstetrics & Gynecology

## 2016-08-17 ENCOUNTER — Ambulatory Visit: Payer: Medicaid Other

## 2016-08-29 ENCOUNTER — Ambulatory Visit: Payer: Medicaid Other | Admitting: *Deleted

## 2016-09-01 NOTE — Progress Notes (Signed)
Addendum:  Staples removed, benzoin applied, steristrips applied. Incision remains clean  , dry, intact without rednesss , drainage or edema. Instructed to keep incision clean, dry, intact  And check wound daily. Instructed to remove steristrips after 7-10 days. Instructed her to call office or go to [facility] if wound has redess, edema, drainage, or opens . She voices understanding. Reminded her of her postpartum appt.

## 2016-09-22 ENCOUNTER — Ambulatory Visit (INDEPENDENT_AMBULATORY_CARE_PROVIDER_SITE_OTHER): Payer: Medicaid Other | Admitting: Obstetrics and Gynecology

## 2016-09-22 ENCOUNTER — Encounter: Payer: Self-pay | Admitting: Obstetrics and Gynecology

## 2016-09-22 DIAGNOSIS — O99891 Other specified diseases and conditions complicating pregnancy: Secondary | ICD-10-CM

## 2016-09-22 DIAGNOSIS — O165 Unspecified maternal hypertension, complicating the puerperium: Secondary | ICD-10-CM

## 2016-09-22 DIAGNOSIS — O9989 Other specified diseases and conditions complicating pregnancy, childbirth and the puerperium: Secondary | ICD-10-CM

## 2016-09-22 NOTE — Progress Notes (Signed)
Subjective:     Kathy Sandoval is a 31 y.o. female who presents for a postpartum visit. She is 6 weeks postpartum following a low cervical transverse Cesarean section. I have fully reviewed the prenatal and intrapartum course. The delivery was at 34 gestational weeks. Outcome: primary cesarean section, low transverse incision. Anesthesia: general. Postpartum course has been uncomplicated. Patient has type 2 DM.  Baby's course has been uncomplicated, both are home from NICU.  Baby is feeding by both breast and bottle - Similac Neosure. Bleeding staining only. Bowel function is normal. Bladder function is normal. Patient is not sexually active. Contraception method is none. Postpartum depression screening: negative.  The following portions of the patient's history were reviewed and updated as appropriate: allergies, current medications, past family history, past medical history, past social history, past surgical history and problem list.  Review of Systems Pertinent items are noted in HPI.   Objective:    BP 118/80   Pulse 77   Ht 5\' 5"  (1.651 m)   Wt 218 lb 4.8 oz (99 kg)   Breastfeeding? Yes   BMI 36.33 kg/m      General:  alert, cooperative and appears stated age   Breasts:  Not examined   Lungs: clear to auscultation bilaterally  Heart:  regular rate and rhythm, S1, S2 normal, no murmur, click, rub or gallop  Abdomen: soft, non-tender; bowel sounds normal; no masses,  no organomegaly   Vulva:  not evaluated  Vagina: not evaluated  Cervix:  Not evaluated   Corpus: not examined  Adnexa:  not evaluated  Rectal Exam: Not performed.        Assessment:   Normal postpartum exam. Pap smear not done at today's visit. Last PAP was normal at the [facility] 10/2015  Plan:   1. Contraception: none Patient was 40 minutes late for her appointment today. Unable to place IUD at this time. Patient plans to schedule a visit for IUD placement.  2.  Follow up in: 1 week or as needed. For IUD placement   3. Patient plans to see [facility] for DM management; continue metformin   Rasch, Harolyn RutherfordJennifer I, NP

## 2016-09-23 LAB — CBC
Hematocrit: 38.7 % (ref 34.0–46.6)
Hemoglobin: 11.6 g/dL (ref 11.1–15.9)
MCH: 26.1 pg — ABNORMAL LOW (ref 26.6–33.0)
MCHC: 30 g/dL — ABNORMAL LOW (ref 31.5–35.7)
MCV: 87 fL (ref 79–97)
PLATELETS: 329 10*3/uL (ref 150–379)
RBC: 4.45 x10E6/uL (ref 3.77–5.28)
RDW: 17.1 % — ABNORMAL HIGH (ref 12.3–15.4)
WBC: 6.2 10*3/uL (ref 3.4–10.8)

## 2016-10-03 ENCOUNTER — Encounter: Payer: Self-pay | Admitting: Obstetrics and Gynecology

## 2016-10-03 ENCOUNTER — Ambulatory Visit (INDEPENDENT_AMBULATORY_CARE_PROVIDER_SITE_OTHER): Payer: Medicaid Other | Admitting: Obstetrics and Gynecology

## 2016-10-03 VITALS — BP 115/73 | HR 86 | Wt 221.0 lb

## 2016-10-03 DIAGNOSIS — F419 Anxiety disorder, unspecified: Secondary | ICD-10-CM

## 2016-10-03 DIAGNOSIS — Z30430 Encounter for insertion of intrauterine contraceptive device: Secondary | ICD-10-CM | POA: Diagnosis not present

## 2016-10-03 DIAGNOSIS — Z3202 Encounter for pregnancy test, result negative: Secondary | ICD-10-CM | POA: Diagnosis not present

## 2016-10-03 MED ORDER — LEVONORGESTREL 18.6 MCG/DAY IU IUD
INTRAUTERINE_SYSTEM | Freq: Once | INTRAUTERINE | Status: AC
  Administered 2016-10-03: 10:00:00 via INTRAUTERINE

## 2016-10-03 NOTE — Procedures (Signed)
Intrauterine Device (IUD) Insertion Procedure Note  After a recent pap (results: negative at [facility]/date: 09/2015) and recent negative gonorrhea-chlamydia testing (date: 02/2016) was confirmed, written consent was obtained; her urine pregnancy test was: negative The patient understands the risks of IUD placement, which include but are not limited to: bleeding, infection, uterine perforation, risk of expulsion, risk of failure < 1%, increased risk of ectopic pregnancy in the event of failure.   Prior to the procedure being performed, the patient (or guardian) was asked to state their full name, date of birth, and the type of procedure being performed. A bimanual exam showed the uterus to be anteverted.  Next, the cervix and vagina were cleaned with an antiseptic solution, and the cervix was grasped with a tenaculum.  The uterus was sounded to 10 cm.  The Liletta was placed without difficulty in the usual, sterile fashion.  The strings were cut to 3-4 cm.  The tenaculum was removed and cervix was found to be hemostatic after application of pressure and silver nitrate.   No complications, patient tolerated the procedure well. Patient told to consider it effective in 1 week  [place] Bingharlie Rachella Basden, Montez HagemanJr MD Attending Center for [facility](Faculty Practice)

## 2016-10-03 NOTE — Addendum Note (Signed)
Addended by: Garret ReddishBARNES, Kaisei Gilbo M on: 10/03/2016 10:16 AM   Modules accepted: Orders

## 2016-10-24 ENCOUNTER — Telehealth: Payer: Self-pay | Admitting: *Deleted

## 2016-10-24 NOTE — Telephone Encounter (Signed)
Called patient to verify whether she had been to see the opthalmologic during her pregnancy. Patient stated she went to [facility] in May of this year.

## 2016-11-03 ENCOUNTER — Ambulatory Visit (INDEPENDENT_AMBULATORY_CARE_PROVIDER_SITE_OTHER): Payer: Medicaid Other | Admitting: Clinical

## 2016-11-03 ENCOUNTER — Ambulatory Visit (INDEPENDENT_AMBULATORY_CARE_PROVIDER_SITE_OTHER): Payer: Medicaid Other | Admitting: Obstetrics and Gynecology

## 2016-11-03 ENCOUNTER — Encounter: Payer: Self-pay | Admitting: Obstetrics and Gynecology

## 2016-11-03 VITALS — BP 119/81 | Wt 224.0 lb

## 2016-11-03 DIAGNOSIS — Z23 Encounter for immunization: Secondary | ICD-10-CM | POA: Diagnosis not present

## 2016-11-03 DIAGNOSIS — F4322 Adjustment disorder with anxiety: Secondary | ICD-10-CM | POA: Diagnosis not present

## 2016-11-03 DIAGNOSIS — R5383 Other fatigue: Secondary | ICD-10-CM | POA: Diagnosis not present

## 2016-11-03 DIAGNOSIS — Z30431 Encounter for routine checking of intrauterine contraceptive device: Secondary | ICD-10-CM | POA: Diagnosis not present

## 2016-11-03 NOTE — BH Specialist Note (Signed)
Integrated Behavioral Health Initial Visit  MRN: [id number] Name: Kathy Sandoval   Session Start time: 11:40 Session End time: 12:10 Total time: 30 minutes  Type of Service: Integrated Behavioral Health- Individual/Family Interpretor:No. Interpretor Name and Language: n/a   Warm Hand Off Completed.       SUBJECTIVE: Kathy Sandoval is a 31 y.o. female accompanied by patient and children. Patient was referred by Dr Vergie LivingPickens for anxiety. Patient reports the following symptoms/concerns: Pt states that her primary concern today is Pt states her primary concern today is being anxious and easily irritated and lack of sleep postpartum; pt open to learning coping strategy.  Duration of problem: Increased over past three months; Severity of problem: moderate  OBJECTIVE: Mood: Irritable and Affect: Appropriate Risk of harm to self or others: No plan to harm self or others   LIFE CONTEXT: Family and Social: Lives with husband and 4 children(15,5,almost 70mo twins) School/Work: Husband works Self-Care: Attempts to sleep when baby sleeps for self-care Life Changes: Recent childbirth of twins   GOALS ADDRESSED: Patient will reduce symptoms of: anxiety and depression and increase knowledge and/or ability of: coping skills and also: Increase healthy adjustment to current life circumstances and Increase adequate support systems for patient/family   INTERVENTIONS: Mindfulness or Relaxation Training and Psychoeducation and/or Health Education  Standardized Assessments completed: GAD-7 and PHQ 9  ASSESSMENT: Patient currently experiencing Adjustment disorder with anxious and depressed mood. Patient may benefit from psychoeducation and brief therapeutic interventions regarding coping with symptoms of anxiety.  PLAN: 1. Follow up with behavioral health clinician on : As needed 2. Behavioral recommendations:  -CALM relaxation breathing exercise daily, at breakfast -Try different sleep sounds  nightly this week, to find one that twins likes best at night; play only at nighttime sleep -Read educational material regarding coping with symptoms of anxiety and depression 3. Referral(s): Integrated Behavioral Health Services (In Clinic) 4. "From scale of 1-10, how likely are you to follow plan?": 9  Rae LipsJamie C Toney Difatta, LCSWA     [facility] screen [facility] 2/9 11/03/2016 09/22/2016 08/04/2016 07/28/2016 07/06/2016  Decreased Interest 2 1 1 2 1   Down, Depressed, Hopeless 2 1 0 1 1  PHQ - 2 Score 4 2 1 3 2   Altered sleeping 2 1 1 2 2   Tired, decreased energy 2 1 2 2 2   Change in appetite 2 0 0 0 1  Feeling bad or failure about yourself  1 0 0 0 -  Trouble concentrating 2 0 0 0 0  Moving slowly or fidgety/restless 0 0 0 1 0  Suicidal thoughts 0 0 0 0 0  PHQ-9 Score 13 4 4 8 7   Some recent data might be hidden   GAD 7 : Generalized Anxiety Score 11/03/2016 09/22/2016 08/04/2016 07/28/2016  Nervous, Anxious, on Edge 2 1 1 1   Control/stop worrying 2 0 1 1  Worry too much - different things 2 1 1 1   Trouble relaxing 2 1 1 2   Restless 1 0 0 1  Easily annoyed or irritable 3 1 0 0  Afraid - awful might happen 2 0 0 1  Total GAD 7 Score 14 4 4 7    Outpatient Encounter Prescriptions as of 11/03/2016  Medication Sig  . ACCU-CHEK SOFTCLIX LANCETS lancets U UTD  . ferrous gluconate (FERGON) 324 MG tablet Take 1 tablet (324 mg total) by mouth 2 (two) times daily with a meal. (Patient not taking: Reported on 10/03/2016)  . glucose blood (ACCU-CHEK AVIVA PLUS) test strip Use as  instructed (Patient not taking: Reported on 10/03/2016)  . ibuprofen (ADVIL,MOTRIN) 600 MG tablet Take 1 tablet (600 mg total) by mouth every 6 (six) hours as needed. (Patient not taking: Reported on 10/03/2016)  . metFORMIN (GLUCOPHAGE) 500 MG tablet Take 1 tablet (500 mg total) by mouth 2 (two) times daily with a meal.  . Prenatal Multivit-Min-Fe-FA (PRENATAL VITAMINS) 0.8 MG tablet Take 1 tablet by mouth daily.   No facility-administered  encounter medications on file as of 11/03/2016.

## 2016-11-03 NOTE — Progress Notes (Signed)
Obstetrics and Gynecology Visit Return Patient Evaluation  Appointment Date: 11/03/2016  Primary Care Provider: Julieanne MansonMulberry, Elizabeth  Referring Provider: Julieanne MansonMulberry, Elizabeth, MD  Chief Complaint: IUD string check  History of Present Illness:  Kathy Sandoval is a 31 y.o. here for IUD follow up. She had Liletta 8/6 placed. Has had AUB since delivery and it's slowed and stopped a few days ago to just stopped to spotting. No pain.    Review of Systems:  as noted in the History of Present Illness.  Medications: none Allergies: has No Known Allergies.  Physical Exam:  BP 119/81   Wt 224 lb (101.6 kg)   BMI 37.28 kg/m  Body mass index is 37.28 kg/m. General appearance: Well nourished, well developed female in no acute distress.  Abdomen: diffusely non tender to palpation, non distended, and no masses, hernias Neuro/Psych:  Normal mood and affect.    Pelvic exam:  Two IUD strings present seen coming from the cervical os, approx 2-3cm in length EGBUS, vaginal vault and cervix: within normal limits   Assessment: IUD strings present in proper location; pt doing well  Plan:  1. Lethargy Occasionally. Sounds like she is getting a decent amount of sleep (approx 6/night). - CBC - TSH  2. AUB Pt told to give IUD 2-1631m s/p insertion and if still AUB to let us know and can do a month or two of OCPs  RTC: PRN  Cornelia Copaharlie Jahmya Onofrio, Jr MD Attending Center for [facility](Faculty Practice)

## 2016-11-04 LAB — TSH: TSH: 1.54 u[IU]/mL (ref 0.450–4.500)

## 2016-11-04 LAB — CBC
Hematocrit: 40.2 % (ref 34.0–46.6)
Hemoglobin: 12.7 g/dL (ref 11.1–15.9)
MCH: 26.5 pg — ABNORMAL LOW (ref 26.6–33.0)
MCHC: 31.6 g/dL (ref 31.5–35.7)
MCV: 84 fL (ref 79–97)
Platelets: 339 x10E3/uL (ref 150–379)
RBC: 4.8 x10E6/uL (ref 3.77–5.28)
RDW: 16.9 % — ABNORMAL HIGH (ref 12.3–15.4)
WBC: 9 x10E3/uL (ref 3.4–10.8)

## 2017-02-11 ENCOUNTER — Other Ambulatory Visit: Payer: Self-pay | Admitting: Family Medicine

## 2017-07-18 ENCOUNTER — Ambulatory Visit (HOSPITAL_COMMUNITY)
Admission: EM | Admit: 2017-07-18 | Discharge: 2017-07-18 | Disposition: A | Discharge status: Home / Self Care | Payer: Medicaid Other | Attending: Family Medicine | Admitting: Family Medicine

## 2017-07-18 ENCOUNTER — Encounter (HOSPITAL_COMMUNITY): Payer: Self-pay | Admitting: Family Medicine

## 2017-07-18 DIAGNOSIS — S61411A Laceration without foreign body of right hand, initial encounter: Secondary | ICD-10-CM | POA: Diagnosis not present

## 2017-07-18 NOTE — Discharge Instructions (Addendum)
Please return in 7 days for removal of sutures.  If you develop any redness or increasing swelling and pain, please return before 7 days.  Keep the wound dry tonight but after tonight, you can bathe normally.  Keep the wound covered when you are in a dirty environment

## 2017-07-18 NOTE — ED Triage Notes (Signed)
Pt here for laceration to the right hand from a can.

## 2017-07-18 NOTE — ED Provider Notes (Signed)
[facility]   [id number] 07/18/17 Arrival Time: 1921   SUBJECTIVE:  Kathy Sandoval is a 32 y.o. female who presents to the urgent care with complaint of laceration to the right hand from a can.   Tetanus is up-to-date  Past Medical History:  Diagnosis Date  . Anemia   . Diabetes in pregnancy 11/08/2010  . Placental abruption in third trimester   . Postpartum hypertension 08/14/2016   Family History  Problem Relation Age of Onset  . Thyroid disease Mother    Social History   Socioeconomic History  . Marital status: Married    Spouse name: Not on file  . Number of children: Not on file  . Years of education: Not on file  . Highest education level: Not on file  Occupational History  . Not on file  Social Needs  . Financial resource strain: Not on file  . Food insecurity:    Worry: Not on file    Inability: Not on file  . Transportation needs:    Medical: Not on file    Non-medical: Not on file  Tobacco Use  . Smoking status: Former Smoker    Types: Cigarettes    Last attempt to quit: 06/29/2011    Years since quitting: 6.0  . Smokeless tobacco: Never Used  Substance and Sexual Activity  . Alcohol use: Yes    Alcohol/week: 1.2 oz    Types: 2 Standard drinks or equivalent per week    Comment: prior to preg  . Drug use: No  . Sexual activity: Not Currently    Partners: Male    Birth control/protection: Other-see comments    Comment: last sex was February 29 2016.  Lifestyle  . Physical activity:    Days per week: Not on file    Minutes per session: Not on file  . Stress: Not on file  Relationships  . Social connections:    Talks on phone: Not on file    Gets together: Not on file    Attends religious service: Not on file    Active member of club or organization: Not on file    Attends meetings of clubs or organizations: Not on file    Relationship status: Not on file  . Intimate partner violence:    Fear of current or ex partner: Not on file   Emotionally abused: Not on file    Physically abused: Not on file    Forced sexual activity: Not on file  Other Topics Concern  . Not on file  Social History Narrative  . Not on file   No outpatient medications have been marked as taking for the 07/18/17 encounter [facility] Encounter).   No Known Allergies    ROS: As per HPI, remainder of ROS negative.   OBJECTIVE:   Vitals:   07/18/17 1958  BP: 122/84  Pulse: 95  Resp: 18  Temp: 98.5 F (36.9 C)  SpO2: 100%     General appearance: alert; no distress Eyes: PERRL; EOMI; conjunctiva normal HENT: normocephalic; atraumatic; TMs normal, canal normal, external ears normal without trauma; nasal mucosa normal; oral mucosa normal Neck: supple Lungs: clear to auscultation bilaterally Heart: regular rate and rhythm Abdomen: soft, non-tender; bowel sounds normal; no masses or organomegaly; no guarding or rebound tenderness Back: no CVA tenderness Extremities: no cyanosis or edema; symmetrical with no gross deformities Skin: warm and dry Neurologic: normal gait; grossly normal Psychological: alert and cooperative; normal mood and affect      Labs:  Results for orders placed or performed in visit on 11/03/16  CBC  Result Value Ref Range   WBC 9.0 3.4 - 10.8 x10E3/uL   RBC 4.80 3.77 - 5.28 x10E6/uL   Hemoglobin 12.7 11.1 - 15.9 g/dL   Hematocrit 16.1 09.6 - 46.6 %   MCV 84 79 - 97 fL   MCH 26.5 (L) 26.6 - 33.0 pg   MCHC 31.6 31.5 - 35.7 g/dL   RDW 04.5 (H) 40.9 - 81.1 %   Platelets 339 150 - 379 x10E3/uL  TSH  Result Value Ref Range   TSH 1.540 0.450 - 4.500 uIU/mL    Labs Reviewed - No data to display  No results found.     ASSESSMENT & PLAN:  1. Laceration of right hand without foreign body, initial encounter   Please return in 7 days for removal of sutures.  If you develop any redness or increasing swelling and pain, please return before 7 days.  Keep the wound dry tonight but after tonight, you can  bathe normally.  Keep the wound covered when you are in a dirty environment   No orders of the defined types were placed in this encounter.   Reviewed expectations re: course of current medical issues. Questions answered. Outlined signs and symptoms indicating need for more acute intervention. Patient verbalized understanding. After Visit Summary given.    Procedures: The right thenar laceration, measuring 3 cm, was anesthetized with 2% Xylocaine with epi after Betadine prep. Wound was then washed thoroughly with soap and water, blot it dry, and reprepped with Betadine. The wound was then closed with two 4-0 Prolene horizontal mattress sutures and a sterile dressing was applied.     Elvina Sidle, MD 07/18/17 2039

## 2017-08-12 IMAGING — US US MFM OB DETAIL+14 WK
1 series · 12 of 28 positions shown · non-contrast
Comparison: none

[Series 1: us mfm ob detail+14 wk · 144 acquisitions, 12 frames shown]
[im 6/144]
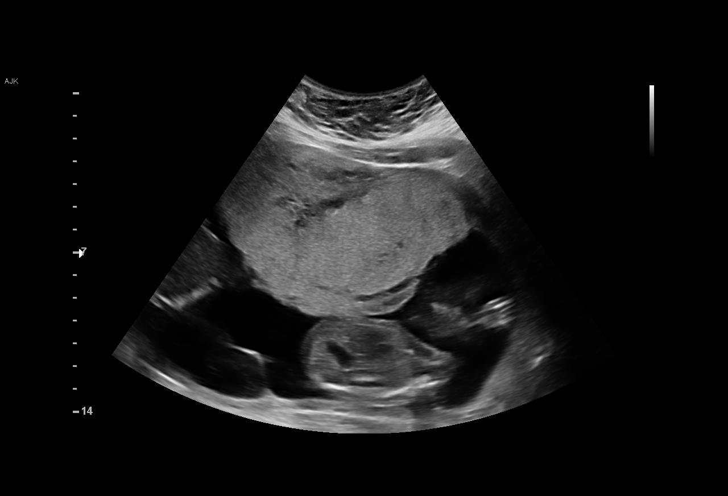
[im 16/144]
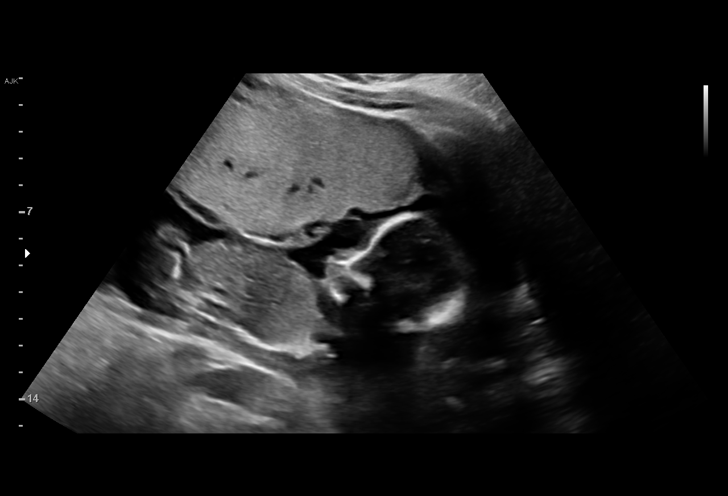
[im 27/144]
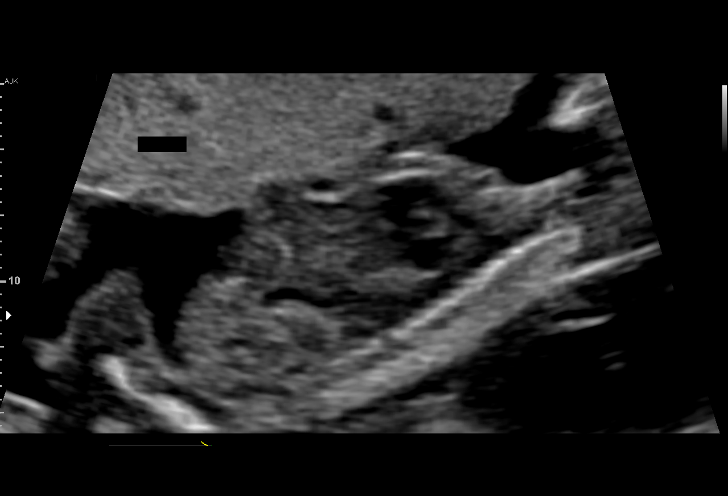
[im 43/144]
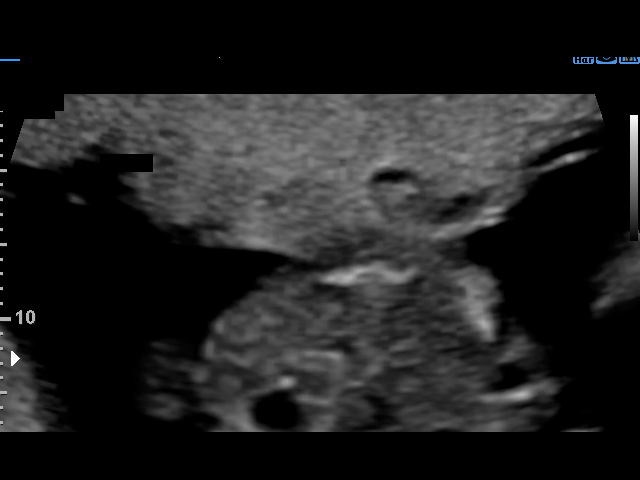
[im 53/144]
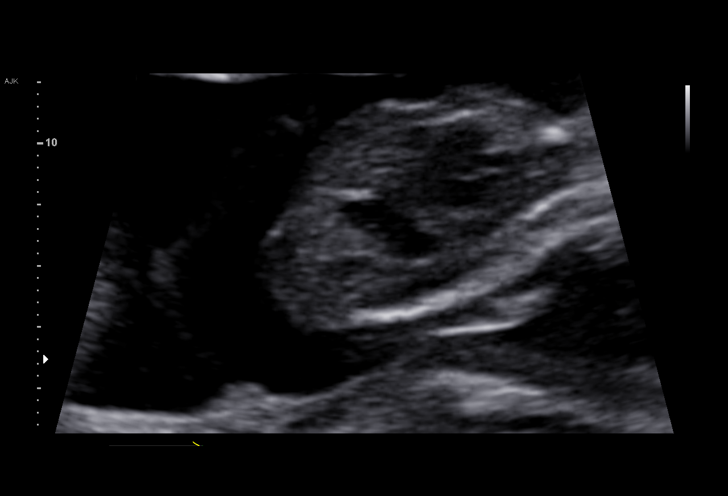
[im 64/144]
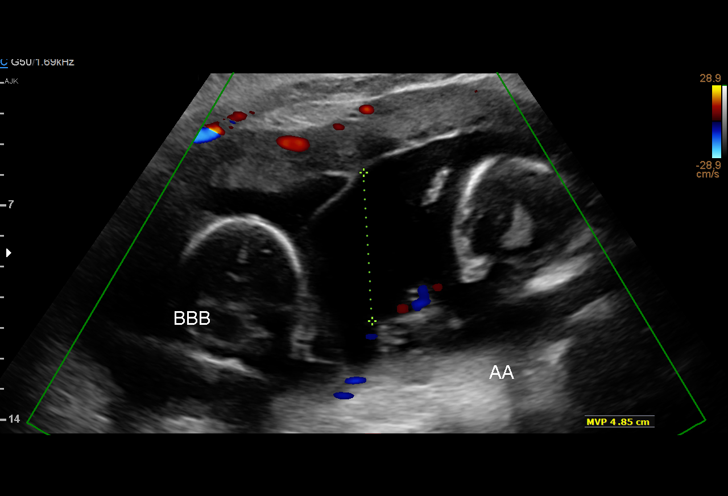
[im 80/144]
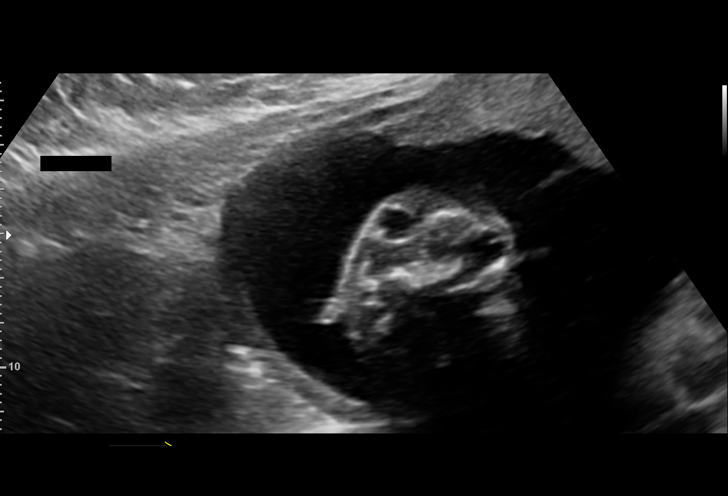
[im 91/144]
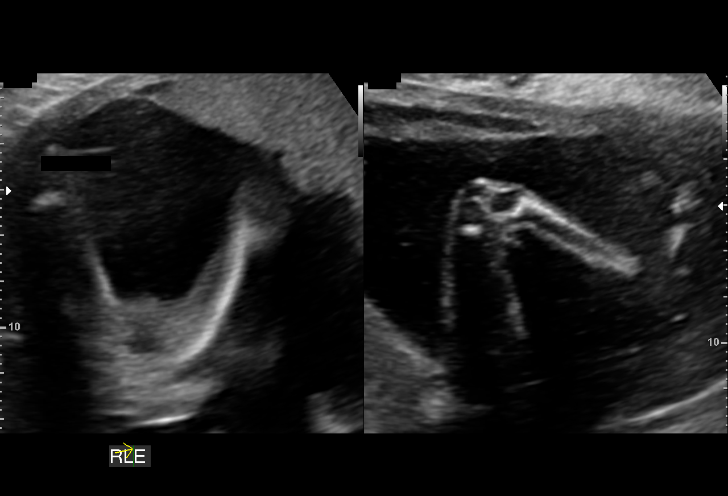
[im 101/144]
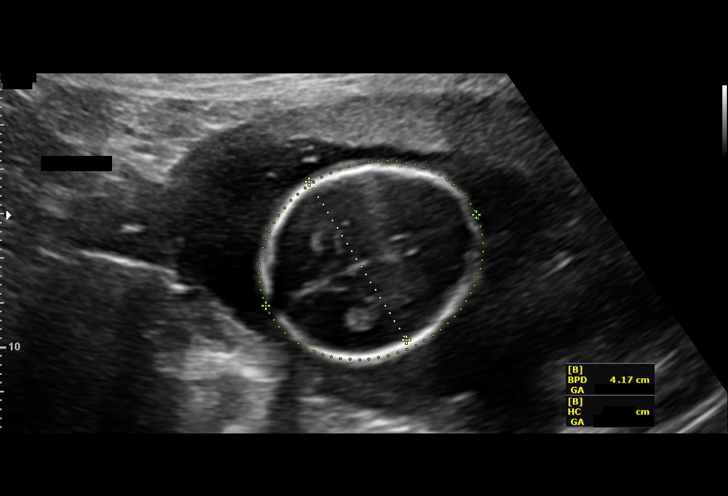
[im 117/144]
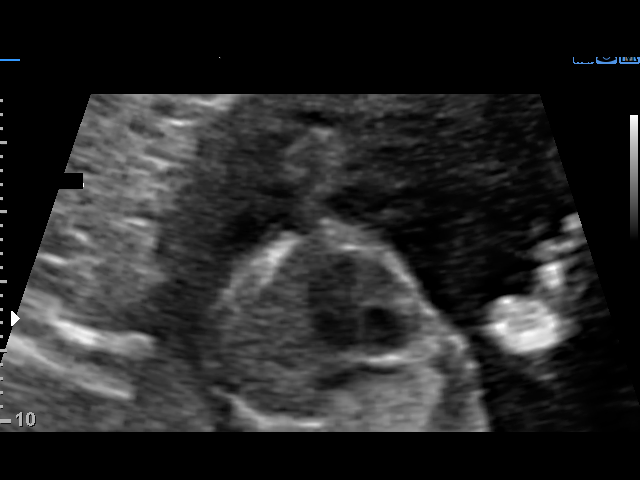
[im 128/144]
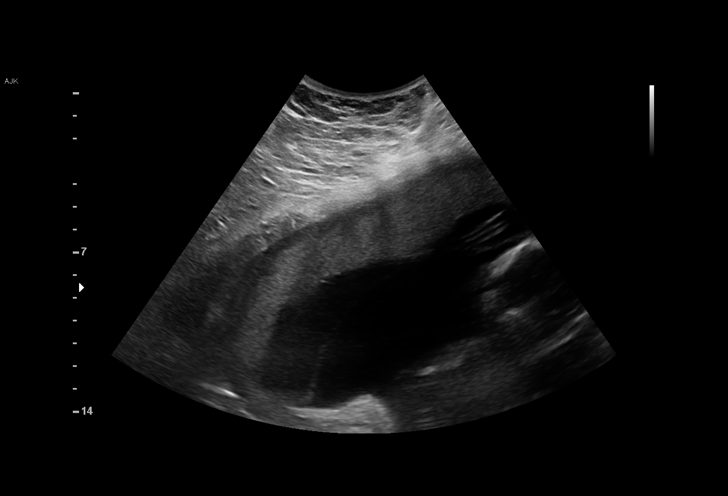
[im 138/144]
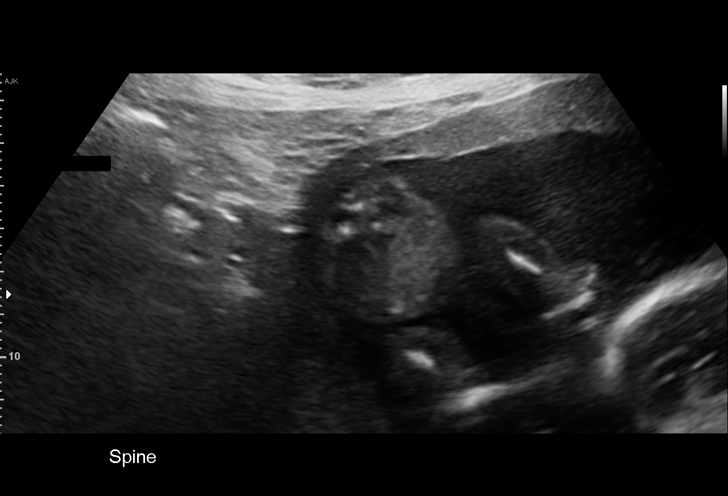

[12 of 28 positions shown; findings below may reference images not displayed]

WK

1  KODJOE FOKUO            [id number]      [id number]     [id number]
2  KODJOE FOKUO            [id number]      [id number]     [id number]
Indications

18 weeks gestation of pregnancy
Twin pregnancy, di/di, second trimester
Encounter for fetal anatomic survey
Obesity complicating pregnancy, second
trimester
Pre-existing diabetes, type 2, in pregnancy,
second trimester (metformin, glyburide)
Abnormal biochemical screen (quad) for
Trisomy 21
OB History

Gravidity:    3         Term:   2        Prem:   0        SAB:   0
TOP:          0       Ectopic:  0        Living: 2
Fetal Evaluation (Fetus A)

Num Of Fetuses:     2
Fetal Heart         162
Rate(bpm):
Cardiac Activity:   Observed
Presentation:       Cephalic
Placenta:           Anterior, above cervical os
P. Cord Insertion:  Visualized, central
Membrane Desc:      Dividing Membrane seen - Dichorionic.

Amniotic Fluid
AFI FV:      Subjectively within normal limits
Largest Pocket(cm)
4.85
Biometry (Fetus A)

BPD:      44.1  mm     G. Age:  19w 2d         77  %    CI:        82.76   %    70 - 86
FL/HC:      17.9   %    16.1 -
HC:      152.9  mm     G. Age:  18w 2d         23  %    HC/AC:      1.19        1.09 -
AC:      128.2  mm     G. Age:  18w 3d         36  %    FL/BPD:     62.1   %
FL:       27.4  mm     G. Age:  18w 3d         32  %    FL/AC:      21.4   %    20 - 24
CER:      20.1  mm     G. Age:  19w 1d         63  %

Est. FW:     238  gm      0 lb 8 oz     39  %     FW Discordancy        16  %
Gestational Age (Fetus A)

LMP:           18w 5d        Date:  12/10/15                 EDD:   09/15/16
U/S Today:     18w 4d                                        EDD:   09/16/16
Best:          18w 5d     Det. By:  LMP  (12/10/15)          EDD:   09/15/16
Anatomy (Fetus A)

Cranium:               Appears normal         Aortic Arch:            Not well visualized
Cavum:                 Not well visualized    Ductal Arch:            Appears normal
Ventricles:            Appears normal         Diaphragm:              Appears normal
Choroid Plexus:        Appears normal         Stomach:                Appears normal, left
sided
Cerebellum:            Appears normal         Abdomen:                Appears normal
Posterior Fossa:       Not well visualized    Abdominal Wall:         Appears nml (cord
insert, abd wall)
Nuchal Fold:           Not well visualized    Cord Vessels:           Appears normal (3
vessel cord)
Face:                  Appears normal         Kidneys:                Appear normal
(orbits and profile)
Lips:                  Appears normal         Bladder:                Appears normal
Thoracic:              Appears normal         Spine:                  Not well visualized
Heart:                 Appears normal         Upper Extremities:      Appears normal
(4CH, axis, and situs
RVOT:                  Not well visualized    Lower Extremities:      Appears normal
LVOT:                  Not well visualized

Other:  Parents do not wish to know sex of fetus. Fetus appears to be a male.
Technically difficult due to maternal habitus.

Fetal Evaluation (Fetus B)

Num Of Fetuses:     2
Fetal Heart         165
Rate(bpm):
Cardiac Activity:   Observed
Presentation:       Variable
Placenta:           Anterior, above cervical os
P. Cord Insertion:  Marginal insertion
Membrane Desc:      Dividing Membrane seen - Dichorionic.

Amniotic Fluid
AFI FV:      Subjectively within normal limits

Largest Pocket(cm)
3.83
Biometry (Fetus B)

BPD:      41.5  mm     G. Age:  18w 4d         44  %    CI:         76.7   %    70 - 86
FL/HC:      20.1   %    16.1 -
HC:      150.1  mm     G. Age:  18w 1d         16  %    HC/AC:      1.05        1.09 -
AC:      142.5  mm     G. Age:  19w 4d         75  %    FL/BPD:     72.5   %
FL:       30.1  mm     G. Age:  19w 2d         65  %    FL/AC:      21.1   %    20 - 24

Est. FW:     285  gm    0 lb 10 oz      55  %     FW Discordancy     0 \ 16 %
Gestational Age (Fetus B)

LMP:           18w 5d        Date:  12/10/15                 EDD:   09/15/16
U/S Today:     18w 6d                                        EDD:   09/14/16
Best:          18w 5d     Det. By:  LMP  (12/10/15)          EDD:   09/15/16
Anatomy (Fetus B)

Cranium:               Appears normal         Aortic Arch:            Not well visualized
Cavum:                 Appears normal         Ductal Arch:            Not well visualized
Ventricles:            Appears normal         Diaphragm:              Appears normal
Choroid Plexus:        Appears normal         Stomach:                Appears normal, left
sided
Cerebellum:            Not well visualized    Abdomen:                Appears normal
Posterior Fossa:       Not well visualized    Abdominal Wall:         Appears nml (cord
insert, abd wall)
Nuchal Fold:           Not well visualized    Cord Vessels:           Appears normal (3
vessel cord)
Face:                  Appears normal         Kidneys:                Appear normal
(orbits and profile)
Lips:                  Appears normal         Bladder:                Appears normal
Thoracic:              Appears normal         Spine:                  Appears normal
Heart:                 Appears normal         Upper Extremities:      Appears normal
(4CH, axis, and situs
RVOT:                  Not well visualized    Lower Extremities:      Appears normal
LVOT:                  Not well visualized

Other:  Parents do not wish to know sex of fetus.Fetus appears to be a
female. Nasal bone visualized. Technically difficult due to maternal
habitus.
Cervix Uterus Adnexa

Cervix
Length:            3.5  cm.
Normal appearance by transabdominal scan.

Uterus
No abnormality visualized.

Left Ovary
Not visualized.

Right Ovary
Not visualized.

Adnexa:       No abnormality visualized.
Impression

Di / Di twin gestation at 18 weeks 4 days gestation with fetal
cardiac activity x 2
Cephalic / variable presentation
Anterior placenta without evidence of previa
Normal appearing fetal growth x 2
Incomplete fetal anatomic survey x 2
Cervical length of 
 3.5 cm
Recommendations

Recommend follow-up ultrasound examination in 
 4 weeks
to complete fetal anatomic survey

## 2017-10-29 IMAGING — US US MFM FETAL BPP W/O NON-STRESS
1 series · 13 of 28 positions shown · non-contrast
Comparison: none

[Series 1: us mfm fetal bpp w/o non-stress · 42 acquisitions, 13 frames shown]
[im 2/42]
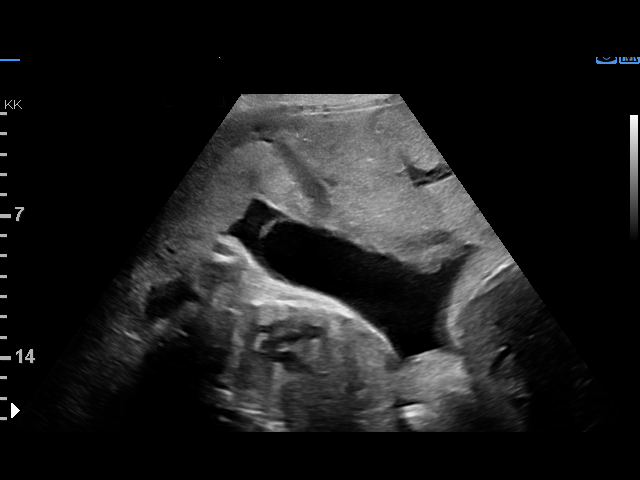
[im 5/42]
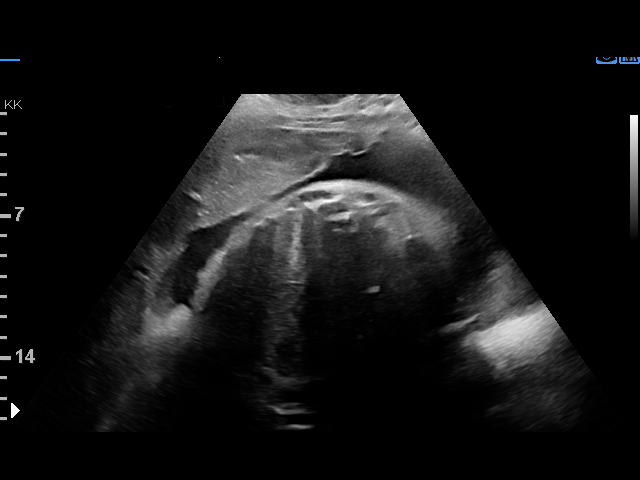
[im 8/42]
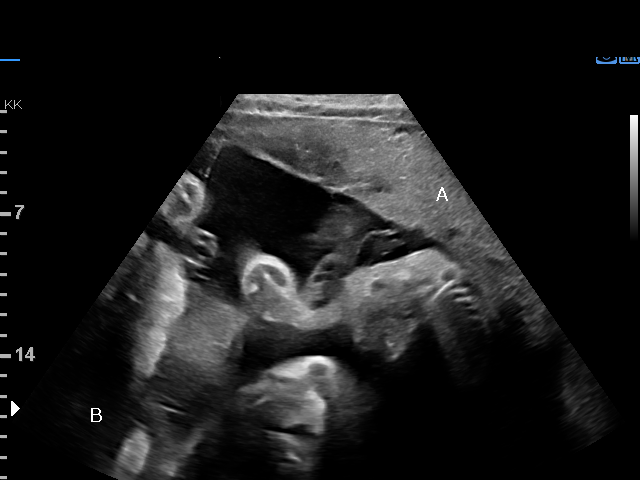
[im 11/42]
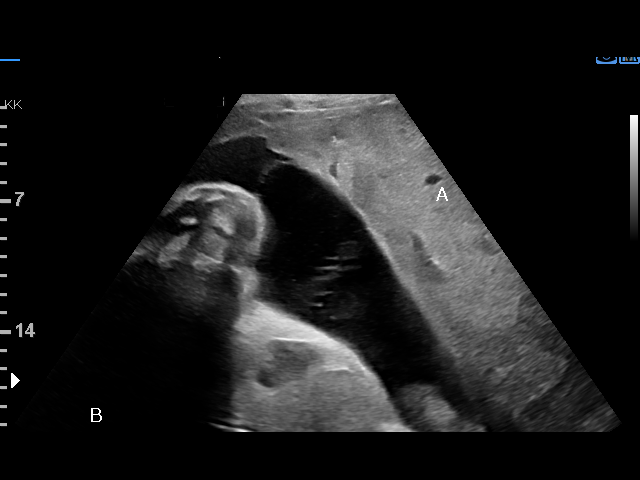
[im 14/42]
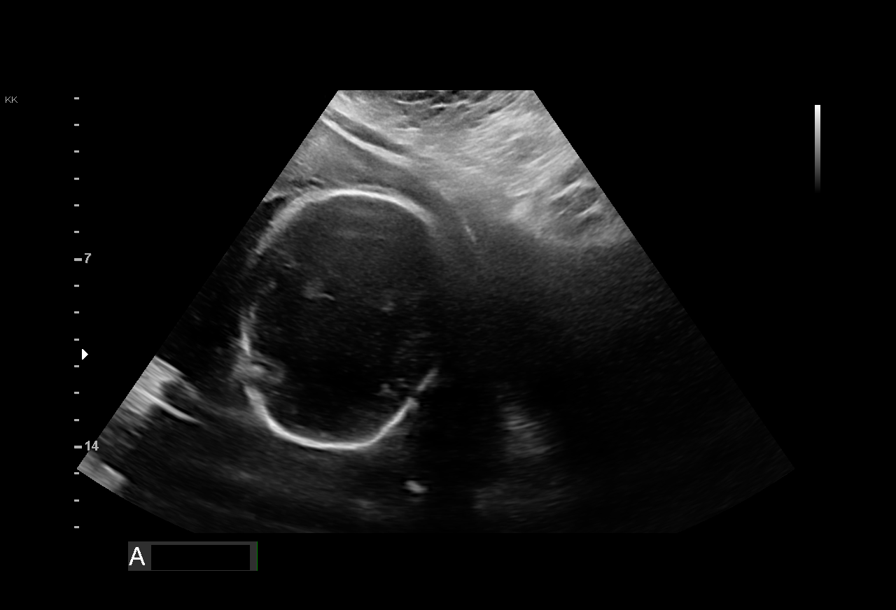
[im 17/42]
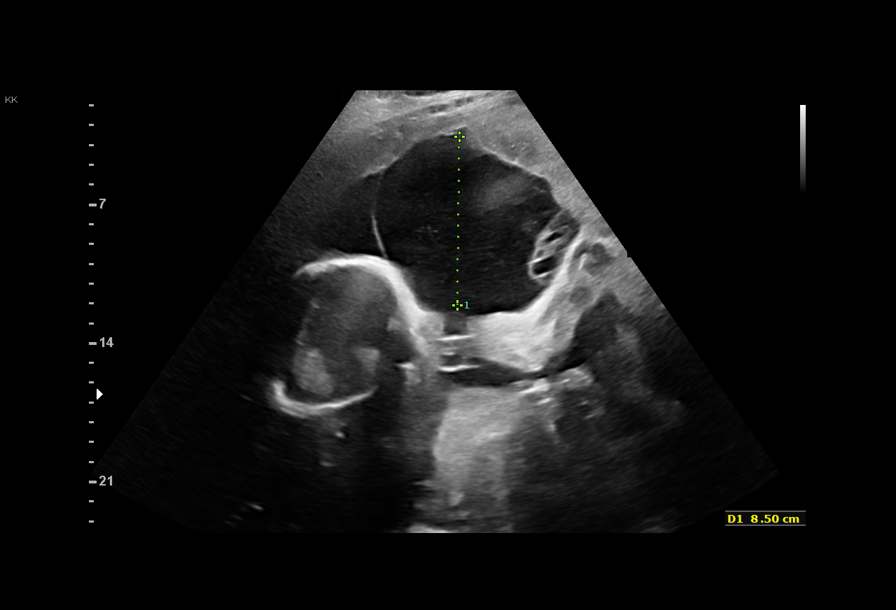
[im 22/42]
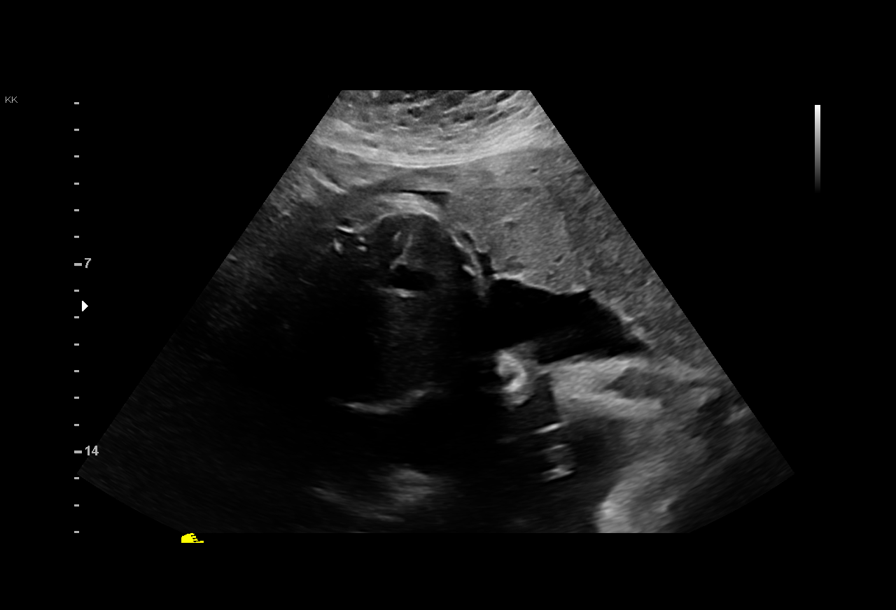
[im 25/42]
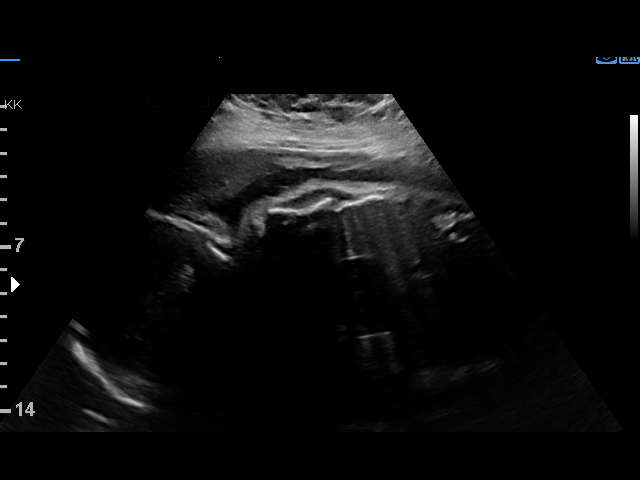
[im 28/42]
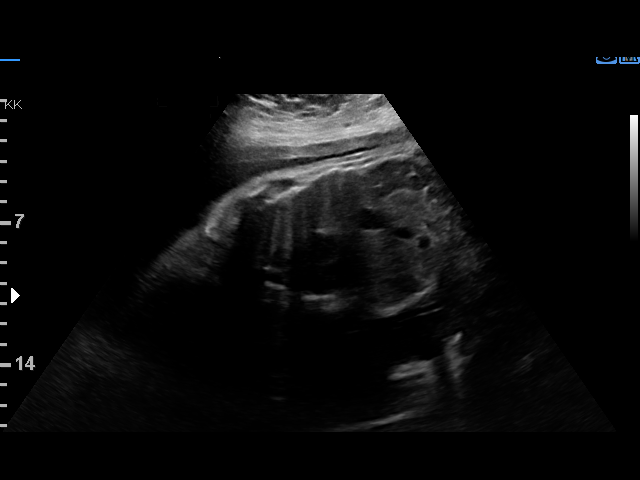
[im 31/42]
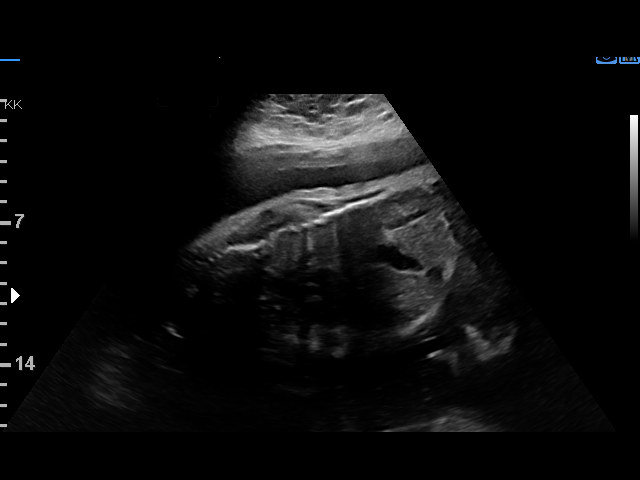
[im 34/42]
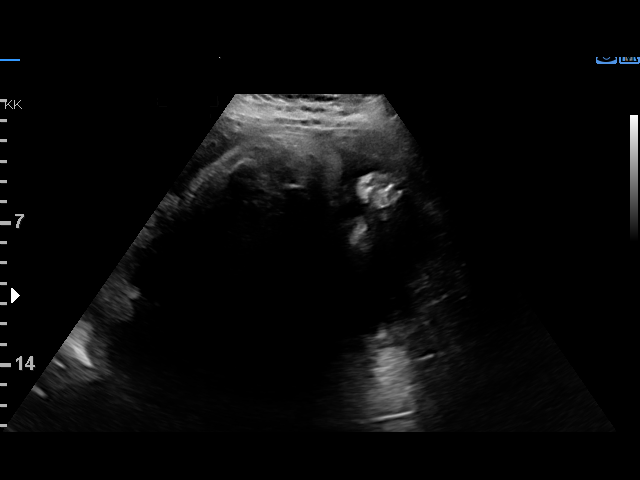
[im 37/42]
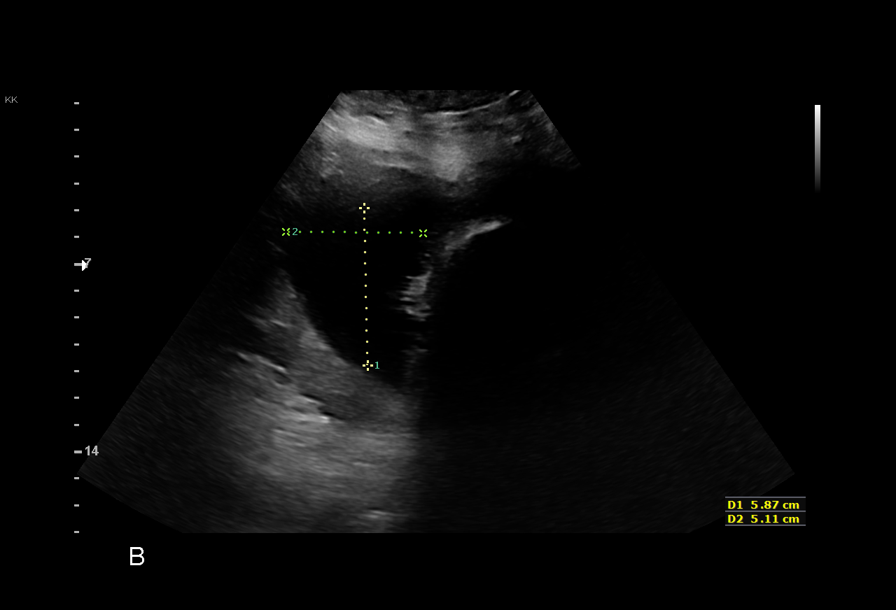
[im 40/42]
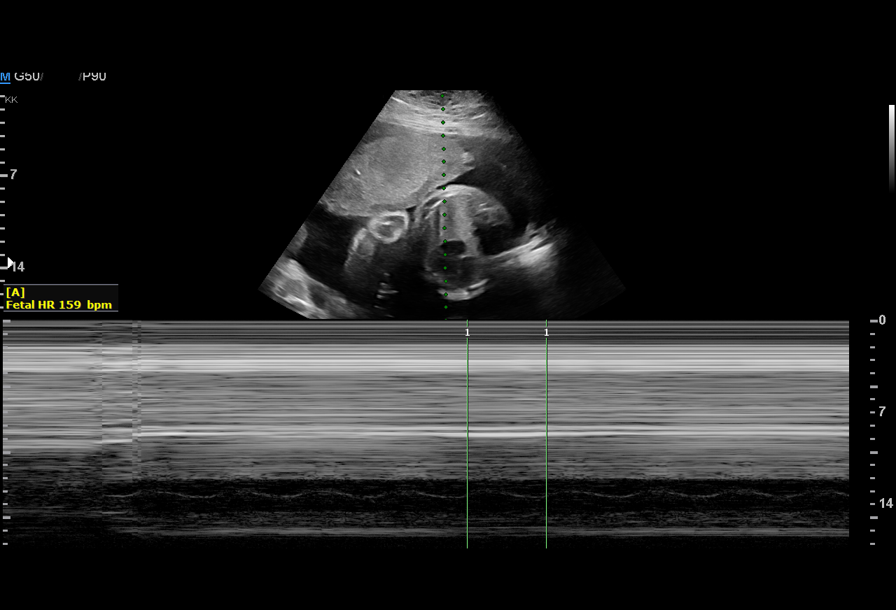

[13 of 28 positions shown; findings below may reference images not displayed]

GESTATION

1  PAULUS N CEEJAY          [id number]      [id number]     [id number]
2  PAULUS N CEEJAY          [id number]      [id number]     [id number]
Indications

29 weeks gestation of pregnancy
Twin pregnancy, di/di, second trimester
Obesity complicating pregnancy, second
trimester
Pre-existing diabetes, type 2, in pregnancy,
second trimester (metformin, glyburide)
Abnormal biochemical screen (quad) for
Trisomy 21-low risk NIPS
Decreased fetal movements, third trimester,
fetus 2
OB History

Gravidity:    3         Term:   2        Prem:   0        SAB:   0
TOP:          0       Ectopic:  0        Living: 2
Fetal Evaluation (Fetus A)

Num Of Fetuses:     2
Fetal Heart         159
Rate(bpm):
Cardiac Activity:   Observed
Fetal Lie:          Lower Left Fetus
Presentation:       Cephalic
Membrane Desc:      Dividing Membrane seen - Dichorionic.

Amniotic Fluid
AFI FV:      Subjectively within normal limits
Largest Pocket(cm)
8.5
Biophysical Evaluation (Fetus A)

Amniotic F.V:   Pocket => 2 cm two         F. Tone:        Observed
planes
F. Movement:    Observed                   Score:          [DATE]
F. Breathing:   Observed
Gestational Age (Fetus A)

LMP:           29w 6d       Date:   12/10/15                 EDD:   09/15/16
Best:          29w 6d    Det. By:   LMP  (12/10/15)          EDD:   09/15/16

Fetal Evaluation (Fetus B)

Num Of Fetuses:     2
Fetal Heart         159
Rate(bpm):
Cardiac Activity:   Observed
Fetal Lie:          Upper Right Fetus
Presentation:       Transverse, head to maternal right
Membrane Desc:      Dividing Membrane seen - Dichorionic.

Amniotic Fluid
AFI FV:      Subjectively within normal limits

Largest Pocket(cm)
5.9
Biophysical Evaluation (Fetus B)

Amniotic F.V:   Pocket => 2 cm two         F. Tone:        Observed
planes
F. Movement:    Observed                   Score:          [DATE]
F. Breathing:   Observed
Gestational Age (Fetus B)

LMP:           29w 6d       Date:   12/10/15                 EDD:   09/15/16
Best:          29w 6d    Det. By:   LMP  (12/10/15)          EDD:   09/15/16
Impression

Dichorionic/diamniotic twin pregnancy at 29+6 weeks
Normal amniotic fluid volume and fetal bladder x 2
BPP [DATE] with MVP A 8.5 and B
Recommendations

Recommend follow-up ultrasound examination in 
 2 weeks

## 2017-11-05 ENCOUNTER — Emergency Department (HOSPITAL_COMMUNITY)
Admission: EM | Admit: 2017-11-05 | Discharge: 2017-11-05 | Disposition: A | Discharge status: Home / Self Care | Payer: Medicaid Other | Attending: Emergency Medicine | Admitting: Emergency Medicine

## 2017-11-05 ENCOUNTER — Encounter (HOSPITAL_COMMUNITY): Payer: Self-pay | Admitting: *Deleted

## 2017-11-05 ENCOUNTER — Other Ambulatory Visit: Payer: Self-pay

## 2017-11-05 ENCOUNTER — Emergency Department (HOSPITAL_COMMUNITY): Payer: Medicaid Other

## 2017-11-05 DIAGNOSIS — Z79899 Other long term (current) drug therapy: Secondary | ICD-10-CM | POA: Diagnosis not present

## 2017-11-05 DIAGNOSIS — Z87891 Personal history of nicotine dependence: Secondary | ICD-10-CM | POA: Diagnosis not present

## 2017-11-05 DIAGNOSIS — E119 Type 2 diabetes mellitus without complications: Secondary | ICD-10-CM | POA: Insufficient documentation

## 2017-11-05 DIAGNOSIS — R1011 Right upper quadrant pain: Secondary | ICD-10-CM | POA: Diagnosis present

## 2017-11-05 DIAGNOSIS — K8020 Calculus of gallbladder without cholecystitis without obstruction: Secondary | ICD-10-CM | POA: Diagnosis not present

## 2017-11-05 DIAGNOSIS — Z7984 Long term (current) use of oral hypoglycemic drugs: Secondary | ICD-10-CM | POA: Diagnosis not present

## 2017-11-05 DIAGNOSIS — R109 Unspecified abdominal pain: Secondary | ICD-10-CM

## 2017-11-05 LAB — I-STAT BETA HCG BLOOD, ED (MC, WL, AP ONLY)

## 2017-11-05 LAB — URINALYSIS, ROUTINE W REFLEX MICROSCOPIC
BILIRUBIN URINE: NEGATIVE
Glucose, UA: 50 mg/dL — AB
Hgb urine dipstick: NEGATIVE
Ketones, ur: NEGATIVE mg/dL
LEUKOCYTES UA: NEGATIVE
NITRITE: NEGATIVE
Protein, ur: NEGATIVE mg/dL
SPECIFIC GRAVITY, URINE: 1.021 (ref 1.005–1.030)
pH: 6 (ref 5.0–8.0)

## 2017-11-05 LAB — COMPREHENSIVE METABOLIC PANEL
ALK PHOS: 57 U/L (ref 38–126)
ALT: 23 U/L (ref 0–44)
ANION GAP: 12 (ref 5–15)
AST: 22 U/L (ref 15–41)
Albumin: 3.8 g/dL (ref 3.5–5.0)
BILIRUBIN TOTAL: 0.6 mg/dL (ref 0.3–1.2)
BUN: 9 mg/dL (ref 6–20)
CALCIUM: 9.2 mg/dL (ref 8.9–10.3)
CO2: 24 mmol/L (ref 22–32)
Chloride: 98 mmol/L (ref 98–111)
Creatinine, Ser: 0.63 mg/dL (ref 0.44–1.00)
GFR calc Af Amer: >60 mL/min (ref >60)
GFR calc non Af Amer: >60 mL/min (ref >60)
Glucose, Bld: 249 mg/dL — ABNORMAL HIGH (ref 70–99)
Potassium: 4.1 mmol/L (ref 3.5–5.1)
Sodium: 134 mmol/L — ABNORMAL LOW (ref 135–145)
Total Protein: 6.9 g/dL (ref 6.5–8.1)

## 2017-11-05 LAB — CBC
HCT: 41.7 % (ref 36.0–46.0)
HEMOGLOBIN: 13.7 g/dL (ref 12.0–15.0)
MCH: 29.2 pg (ref 26.0–34.0)
MCHC: 32.9 g/dL (ref 30.0–36.0)
MCV: 88.9 fL (ref 78.0–100.0)
Platelets: 265 10*3/uL (ref 150–400)
RBC: 4.69 MIL/uL (ref 3.87–5.11)
RDW: 13.1 % (ref 11.5–15.5)
WBC: 12 10*3/uL — AB (ref 4.0–10.5)

## 2017-11-05 LAB — LIPASE, BLOOD: Lipase: 31 U/L (ref 11–51)

## 2017-11-05 MED ORDER — SODIUM CHLORIDE 0.9 % IV BOLUS
1000.0000 mL | Freq: Once | INTRAVENOUS | Status: AC
  Administered 2017-11-05: 1000 mL via INTRAVENOUS

## 2017-11-05 MED ORDER — MORPHINE SULFATE (PF) 4 MG/ML IV SOLN
4.0000 mg | Freq: Once | INTRAVENOUS | Status: AC
  Administered 2017-11-05: 4 mg via INTRAVENOUS
  Filled 2017-11-05: qty 1

## 2017-11-05 NOTE — ED Triage Notes (Signed)
The pt is c/o rt lateral abd pain and rt flank pain since 2300 tonight with n and v  lmp none implant

## 2017-11-05 NOTE — ED Notes (Signed)
Patient transported to US 

## 2017-11-05 NOTE — Discharge Instructions (Signed)
It was my pleasure taking care of you today!   Please call the surgery clinic tomorrow morning to schedule a follow up appointment.   It is VERY important that you monitor your symptoms and return to the Emergency Department if you develop any of the following symptoms:  You have a fever.  You keep throwing up and can't keep fluids down.  You pass bloody or black tarry stools.  There is bright red blood in the stool. You do not seem to be getting better.  You have any questions or concerns.

## 2017-11-05 NOTE — ED Notes (Signed)
Pt tolerating PO ginger ale well with minimal nausea, no vomiting. Patient given peanut butter and graham crackers

## 2017-11-05 NOTE — ED Provider Notes (Signed)
[facility] EMERGENCY DEPARTMENT Provider Note   CSN: [id number] Arrival date & time: 11/05/17  0417     History   Chief Complaint Chief Complaint  Patient presents with  . Abdominal Pain    HPI Kathy Sandoval is a 32 y.o. female.  The history is provided by the patient and medical records. No language interpreter was used.  Abdominal Pain   Associated symptoms include nausea and vomiting. Pertinent negatives include diarrhea and constipation.   Kathy Sandoval is a 32 y.o. female  with a PMH of DM who presents to the Emergency Department complaining of acute onset of right upper quadrant abdominal pain which will intermittently radiate around the right side beginning at 11 PM last night.  Pain was constant for about 2 hours, but has now started to ease up.  She tried applying an ice pack as well as taking 2 ibuprofen.  She did not feel like the ice pack helped, but does think the ibuprofen caused pain to improve.  She has never experienced pain like this before.  She endorses associated nausea and 2 episodes of emesis.  She currently is nausea free.  She denies fever, chills, chest pain, shortness of breath, back pain or urinary symptoms.  No known sick contacts.   Past Medical History:  Diagnosis Date  . Anemia   . Diabetes in pregnancy 11/08/2010  . Placental abruption in third trimester   . Postpartum hypertension 08/14/2016    Patient Active Problem List   Diagnosis Date Noted  . Supervision of high risk pregnancy in first trimester 02/17/2016  . Type 2 diabetes mellitus in pregnancy 02/08/2016  . Obesity 04/12/2010  . MIGRAINE, UNSPEC., W/O INTRACTABLE MIGRAINE 04/27/2006    Past Surgical History:  Procedure Laterality Date  . CESAREAN SECTION MULTI-GESTATIONAL N/A 08/10/2016   Procedure: CESAREAN SECTION MULTI-GESTATIONAL;  Surgeon: Lazaro Arms, MD;  Location: [facility] BIRTHING SUITES;  Service: Obstetrics;  Laterality: N/A;  . NO PAST SURGERIES        OB History    Gravida  3   Para  3   Term  2   Preterm  1   AB  0   Living  4     SAB  0   TAB  0   Ectopic  0   Multiple  1   Live Births  4            Home Medications    Prior to Admission medications   Medication Sig Start Date End Date Taking? Authorizing Provider  ACCU-CHEK SOFTCLIX LANCETS lancets U UTD 07/16/16   [provider]  ACCU-CHEK SOFTCLIX LANCETS lancets USE AS DIRECTED 02/14/17   Levie Heritage, DO  metFORMIN (GLUCOPHAGE) 500 MG tablet Take 1 tablet (500 mg total) by mouth 2 (two) times daily with a meal. 07/05/16   Lorne Skeens, MD  Prenatal Multivit-Min-Fe-FA (PRENATAL VITAMINS) 0.8 MG tablet Take 1 tablet by mouth daily. 02/19/16   Tereso Newcomer, MD    Family History Family History  Problem Relation Age of Onset  . Thyroid disease Mother     Social History Social History   Tobacco Use  . Smoking status: Former Smoker    Types: Cigarettes    Last attempt to quit: 06/29/2011    Years since quitting: 6.3  . Smokeless tobacco: Never Used  Substance Use Topics  . Alcohol use: Yes    Alcohol/week: 2.0 standard drinks    Types: 2 Standard drinks  or equivalent per week    Comment: prior to preg  . Drug use: No     Allergies   Patient has no known allergies.   Review of Systems Review of Systems  Gastrointestinal: Positive for abdominal pain, nausea and vomiting. Negative for constipation and diarrhea.  All other systems reviewed and are negative.    Physical Exam Updated Vital Signs BP 121/69   Pulse 77   Temp 98.1 F (36.7 C) (Oral)   Resp (!) 21   Ht 5\' 6"  (1.676 m)   Wt 98.9 kg   SpO2 97%   BMI 35.19 kg/m   Physical Exam  Constitutional: She is oriented to person, place, and time. She appears well-developed and well-nourished. No distress.  Nontoxic-appearing, comfortable, resting in the bed.  HENT:  Head: Normocephalic and atraumatic.  Cardiovascular: Normal rate, regular rhythm  and normal heart sounds.  No murmur heard. Pulmonary/Chest: Effort normal and breath sounds normal. No respiratory distress.  Abdominal: Soft. She exhibits no distension.  Tenderness to palpation of right upper quadrant without rebound or guarding.  Negative Murphy's.  No CVA tenderness or tenderness to the flank.  Musculoskeletal: She exhibits no edema.  Neurological: She is alert and oriented to person, place, and time.  Skin: Skin is warm and dry.  Nursing note and vitals reviewed.    ED Treatments / Results  Labs (all labs ordered are listed, but only abnormal results are displayed) Labs Reviewed  COMPREHENSIVE METABOLIC PANEL - Abnormal; Notable for the following components:      Result Value   Sodium 134 (*)    Glucose, Bld 249 (*)    All other components within normal limits  CBC - Abnormal; Notable for the following components:   WBC 12.0 (*)    All other components within normal limits  URINALYSIS, ROUTINE W REFLEX MICROSCOPIC - Abnormal; Notable for the following components:   Glucose, UA 50 (*)    All other components within normal limits  LIPASE, BLOOD  I-STAT BETA HCG BLOOD, ED ([facility], [facility], [facility] ONLY)    EKG None  Radiology No results found.  Procedures Procedures (including critical care time)  Medications Ordered in ED Medications  sodium chloride 0.9 % bolus 1,000 mL (has no administration in time range)  morphine 4 MG/ML injection 4 mg (has no administration in time range)     Initial Impression / Assessment and Plan / ED Course  I have reviewed the triage vital signs and the nursing notes.  Pertinent labs & imaging results that were available during my care of the patient were reviewed by me and considered in my medical decision making (see chart for details).    Kathy Sandoval is a 32 y.o. female who presents to ED for acute onset of RUQ abdominal pain which began last night. On exam, patient is afebrile, non-toxic appearing with tenderness to RUQ.  No murphy's sign present. No peritoneal signs. Labs reviewed: white count of 12. Glucose elevated at 249 - no evidence of DKA with normal co2 and AG. UA wdl. Pain improving upon initial examination. RUQ ultrasound obtained showing cholelithiasis without evidence of cholecystitis.  On reevaluation, patient continues to be afebrile.  Repeat abdominal exam benign and patient states pain has improved.  She has tolerated p.o. without any difficulty.  No emesis since entering emergency department. Evaluation does not show pathology that would require ongoing emergent intervention or inpatient treatment.  Follow-up with general surgery.  Reasons to return to the emergency department were  discussed and all questions answered.   Final Clinical Impressions(s) / ED Diagnoses   Final diagnoses:  Abdominal pain    ED Discharge Orders    None       Ward, Chase Picket, PA-C 11/05/17 2417    Gerhard Munch, MD 11/05/17 [phone]

## 2017-11-21 NOTE — Progress Notes (Signed)
Please place orders in Epic as patient is being scheduled for a pre-op appointment! Thank you! 

## 2017-11-22 ENCOUNTER — Ambulatory Visit: Payer: Self-pay | Admitting: General Surgery

## 2017-11-22 NOTE — H&P (Signed)
Kathy Sandoval Documented: 11/20/2017 11:49 AM Location: [facility] Patient #: [id number] DOB: 11/23/1985 Married / Language: English / Race: Undefined Female   History of Present Illness (Rayner Erman M. Danila Eddie MD; 11/20/2017 12:08 PM) The patient is a 32 year old female who presents for evaluation of gall stones. She is referred by Jaime Ward PA-C for evaluation of gallbladder problems. She had an episode earlier this month where she developed right upper quadrant pain in the evening. She awoke at 4:30 in the morning with continued right upper quadrant pain. Since it is not getting better she went to the emergency room. She had normal labs except for an elevated blood glucose and a mild white blood cell count. An abdominal ultrasound showed gallstones and a fatty liver. She states that the pain radiated to her right side and upper back. She also had nausea and vomiting with that episode. She reports 2 prior flares but not as intense. There is prior flares had pain in the right upper abdomen radiating to her back without vomiting. She denies any fevers or chills. She denies any diarrhea or constipation. She has had a prior C-section. She has 15-month-old twins. She denies tobacco. She denies any acholic stools   Problem List/Past Medical (Matei Magnone M. Bayne Fosnaugh, MD; 11/20/2017 12:08 PM) SYMPTOMATIC CHOLELITHIASIS (K80.20)   Past Surgical History (Kaleiah Kutzer M. Shyquan Stallbaumer, MD; 11/20/2017 12:08 PM) Cesarean Section - 1   Diagnostic Studies History (Bain Whichard M. Brogan Martis, MD; 11/20/2017 12:08 PM) Colonoscopy  never Mammogram  never Pap Smear  1-5 years ago  Allergies (April Staton, CMA; 11/20/2017 11:49 AM) No Known Drug Allergies [11/20/2017]:  Medication History (April Staton, CMA; 11/20/2017 11:49 AM) No Current Medications Medications Reconciled  Social History (Christoph Copelan M. Anthonella Klausner, MD; 11/20/2017 12:08 PM) Alcohol use  Occasional alcohol use. Caffeine use  Coffee. No drug use   Family  History (Lilliana Turner M. Caidon Foti, MD; 11/20/2017 12:08 PM) Alcohol Abuse  Father. Diabetes Mellitus  Father. Kidney Disease  Family Members In General. Thyroid problems  Mother.  Pregnancy / Birth History (Annalisia Ingber M. Aviyon Hocevar, MD; 11/20/2017 12:08 PM) Age at menarche  12 years. Contraceptive History  Intrauterine device. Gravida  3 Irregular periods  Length (months) of breastfeeding  12-24 Maternal age  15-20 Para  4  Other Problems (Getsemani Lindon M. Brannen Koppen, MD; 11/20/2017 12:08 PM) Cholelithiasis  Chronic Obstructive Lung Disease  Diabetes Mellitus  Transfusion history   Vitals (April Staton CMA; 11/20/2017 11:50 AM) 11/20/2017 11:49 AM Weight: 220.25 lb Height: 66in Body Surface Area: 2.08 m Body Mass Index: 35.55 kg/m  Temp.: 98.1F(Oral)  Pulse: 97 (Regular)  BP: 130/82 (Sitting, Left Arm, Standard)       Physical Exam (Daviel Allegretto M. Christell Steinmiller MD; 11/20/2017 12:06 PM) General Mental Status-Alert. General Appearance-Consistent with stated age. Hydration-Well hydrated. Voice-Normal.  Head and Neck Head-normocephalic, atraumatic with no lesions or palpable masses. Trachea-midline. Thyroid Gland Characteristics - normal size and consistency.  Eye Eyeball - Bilateral-Extraocular movements intact. Sclera/Conjunctiva - Bilateral-No scleral icterus.  ENMT Ears -Note: normal external ears.  Mouth and Throat -Note: lips intact.   Chest and Lung Exam Chest and lung exam reveals -quiet, even and easy respiratory effort with no use of accessory muscles and on auscultation, normal breath sounds, no adventitious sounds and normal vocal resonance. Inspection Chest Wall - Normal. Back - normal.  Breast - Did not examine.  Cardiovascular Cardiovascular examination reveals -normal heart sounds, regular rate and rhythm with no murmurs and normal pedal pulses bilaterally.  Abdomen Inspection Inspection of the abdomen   reveals - No Hernias. Skin -  Scar - no surgical scars. Palpation/Percussion Palpation and Percussion of the abdomen reveal - Soft, Non Tender, No Rebound tenderness, No Rigidity (guarding) and No hepatosplenomegaly. Auscultation Auscultation of the abdomen reveals - Bowel sounds normal.  Peripheral Vascular Upper Extremity Palpation - Pulses bilaterally normal.  Neurologic Neurologic evaluation reveals -alert and oriented x 3 with no impairment of recent or remote memory. Mental Status-Normal.  Neuropsychiatric The patient's mood and affect are described as -normal. Judgment and Insight-insight is appropriate concerning matters relevant to self.  Musculoskeletal Normal Exam - Left-Upper Extremity Strength Normal and Lower Extremity Strength Normal. Normal Exam - Right-Upper Extremity Strength Normal and Lower Extremity Strength Normal.  Lymphatic Head & Neck  General Head & Neck Lymphatics: Bilateral - Description - Normal. Axillary - Did not examine. Femoral & Inguinal - Did not examine.    Assessment & Plan (Harrison Paulson M. Adante Courington MD; 11/20/2017 12:05 PM) SYMPTOMATIC CHOLELITHIASIS (K80.20) Impression: I believe the patient's symptoms are consistent with gallbladder disease.  We discussed gallbladder disease. The patient was given educational material. We discussed non-operative and operative management. We discussed the signs & symptoms of acute cholecystitis  I discussed laparoscopic cholecystectomy with IOC in detail. The patient was given educational material as well as diagrams detailing the procedure. We discussed the risks and benefits of a laparoscopic cholecystectomy including, but not limited to bleeding, infection, injury to surrounding structures such as the intestine or liver, bile leak, retained gallstones, need to convert to an open procedure, prolonged diarrhea, blood clots such as DVT, common bile duct injury, anesthesia risks, and possible need for additional procedures. We discussed  the typical post-operative recovery course. I explained that the likelihood of improvement of their symptoms is good.  The patient has elected to proceed with surgery. Current Plans Pt Education - Pamphlet Given - Laparoscopic Gallbladder Surgery: discussed with patient and provided information.  Spiro Ausborn M. Sandy Blouch, MD, FACS General, Bariatric, & Minimally Invasive Surgery [facility]  

## 2017-11-22 NOTE — H&P (View-Only) (Signed)
Kathy Sandoval Documented: 11/20/2017 11:49 AM Location: [facility] Patient #: [id number] DOB: 1985-09-26 Married / Language: [place] / Race: Undefined Female   History of Present Illness Kathy Areola M. Andra Matsuo MD; 11/20/2017 12:08 PM) The patient is a 32 year old female who presents for evaluation of gall stones. She is referred by Kathy Sauer PA-C for evaluation of gallbladder problems. She had an episode earlier this month where she developed right upper quadrant pain in the evening. She awoke at 4:30 in the morning with continued right upper quadrant pain. Since it is not getting better she went to the emergency room. She had normal labs except for an elevated blood glucose and a mild white blood cell count. An abdominal ultrasound showed gallstones and a fatty liver. She states that the pain radiated to her right side and upper back. She also had nausea and vomiting with that episode. She reports 2 prior flares but not as intense. There is prior flares had pain in the right upper abdomen radiating to her back without vomiting. She denies any fevers or chills. She denies any diarrhea or constipation. She has had a prior C-section. She has 17-month-old twins. She denies tobacco. She denies any acholic stools   Problem List/Past Medical Kathy Areola M. Andrey Campanile, MD; 11/20/2017 12:08 PM) SYMPTOMATIC CHOLELITHIASIS (K80.20)   Past Surgical History Kathy Areola M. Andrey Campanile, MD; 11/20/2017 12:08 PM) Cesarean Section - 1   Diagnostic Studies History Kathy Areola M. Andrey Campanile, MD; 11/20/2017 12:08 PM) Colonoscopy  never Mammogram  never Pap Smear  1-5 years ago  Allergies (Kathy Sandoval, CMA; 11/20/2017 11:49 AM) No Known Drug Allergies [11/20/2017]:  Medication History (Kathy Sandoval, CMA; 11/20/2017 11:49 AM) No Current Medications Medications Reconciled  Social History Kathy Areola M. Andrey Campanile, MD; 11/20/2017 12:08 PM) Alcohol use  Occasional alcohol use. Caffeine use  Coffee. No drug use   Family  History Kathy Areola M. Andrey Campanile, MD; 11/20/2017 12:08 PM) Alcohol Abuse  Father. Diabetes Mellitus  Father. Kidney Disease  Family Members In General. Thyroid problems  Mother.  Pregnancy / Birth History Kathy Areola M. Andrey Campanile, MD; 11/20/2017 12:08 PM) Age at menarche  12 years. Contraceptive History  Intrauterine device. Gravida  3 Irregular periods  Length (months) of breastfeeding  12-24 Maternal age  31-20 Para  4  Other Problems Kathy Areola M. Andrey Campanile, MD; 11/20/2017 12:08 PM) Cholelithiasis  Chronic Obstructive Lung Disease  Diabetes Mellitus  Transfusion history   Vitals (Kathy Sandoval CMA; 11/20/2017 11:50 AM) 11/20/2017 11:49 AM Weight: 220.25 lb Height: 66in Body Surface Area: 2.08 m Body Mass Index: 35.55 kg/m  Temp.: 98.47F(Oral)  Pulse: 97 (Regular)  BP: 130/82 (Sitting, Left Arm, Standard)       Physical Exam Kathy Areola M. Damiana Berrian MD; 11/20/2017 12:06 PM) General Mental Status-Alert. General Appearance-Consistent with stated age. Hydration-Well hydrated. Voice-Normal.  Head and Neck Head-normocephalic, atraumatic with no lesions or palpable masses. Trachea-midline. Thyroid Gland Characteristics - normal size and consistency.  Eye Eyeball - Bilateral-Extraocular movements intact. Sclera/Conjunctiva - Bilateral-No scleral icterus.  ENMT Ears -Note: normal external ears.  Mouth and Throat -Note: lips intact.   Chest and Lung Exam Chest and lung exam reveals -quiet, even and easy respiratory effort with no use of accessory muscles and on auscultation, normal breath sounds, no adventitious sounds and normal vocal resonance. Inspection Chest Wall - Normal. Back - normal.  Breast - Did not examine.  Cardiovascular Cardiovascular examination reveals -normal heart sounds, regular rate and rhythm with no murmurs and normal pedal pulses bilaterally.  Abdomen Inspection Inspection of the abdomen  reveals - No Hernias. Skin -  Scar - no surgical scars. Palpation/Percussion Palpation and Percussion of the abdomen reveal - Soft, Non Tender, No Rebound tenderness, No Rigidity (guarding) and No hepatosplenomegaly. Auscultation Auscultation of the abdomen reveals - Bowel sounds normal.  Peripheral Vascular Upper Extremity Palpation - Pulses bilaterally normal.  Neurologic Neurologic evaluation reveals -alert and oriented x 3 with no impairment of recent or remote memory. Mental Status-Normal.  Neuropsychiatric The patient's mood and affect are described as -normal. Judgment and Insight-insight is appropriate concerning matters relevant to self.  Musculoskeletal Normal Exam - Left-Upper Extremity Strength Normal and Lower Extremity Strength Normal. Normal Exam - Right-Upper Extremity Strength Normal and Lower Extremity Strength Normal.  Lymphatic Head & Neck  General Head & Neck Lymphatics: Bilateral - Description - Normal. Axillary - Did not examine. Femoral & Inguinal - Did not examine.    Assessment & Plan Kathy Areola M. Ramesh Moan MD; 11/20/2017 12:05 PM) SYMPTOMATIC CHOLELITHIASIS (K80.20) Impression: I believe the patient's symptoms are consistent with gallbladder disease.  We discussed gallbladder disease. The patient was given Agricultural engineer. We discussed non-operative and operative management. We discussed the signs & symptoms of acute cholecystitis  I discussed laparoscopic cholecystectomy with IOC in detail. The patient was given educational material as well as diagrams detailing the procedure. We discussed the risks and benefits of a laparoscopic cholecystectomy including, but not limited to bleeding, infection, injury to surrounding structures such as the intestine or liver, bile leak, retained gallstones, need to convert to an open procedure, prolonged diarrhea, blood clots such as DVT, common bile duct injury, anesthesia risks, and possible need for additional procedures. We discussed  the typical post-operative recovery course. I explained that the likelihood of improvement of their symptoms is good.  The patient has elected to proceed with surgery. Current Plans Pt Education - Pamphlet Given - Laparoscopic Gallbladder Surgery: discussed with patient and provided information.  Mary Sella. Andrey Campanile, MD, FACS General, Bariatric, & Minimally Invasive Surgery [facility] [place]

## 2017-11-23 ENCOUNTER — Ambulatory Visit: Payer: Self-pay | Admitting: General Surgery

## 2017-11-23 NOTE — Progress Notes (Signed)
Cbc, cmp, serum pregnancy, urinalysis 11-15-17 epic

## 2017-11-23 NOTE — Patient Instructions (Addendum)
Kathy Sandoval  11/23/2017   Your procedure is scheduled on: 12-04-17   Report to [facility] Main  Entrance    Report to admitting at 5:30AM    Call this number if you have problems the morning of surgery [phone]     Remember: NO SOLID FOOD AFTER MIDNIGHT THE NIGHT PRIOR TO SURGERY. NOTHING BY MOUTH EXCEPT CLEAR LIQUIDS UNTIL 3 HOURS PRIOR TO SCHEULED SURGERY. PLEASE FINISH ENSURE DRINK PER SURGEON ORDER 3 HOURS PRIOR TO SCHEDULED SURGERY TIME WHICH NEEDS TO BE COMPLETED AT ____4:30AM_____. BRUSH YOUR TEETH MORNING OF SURGERY AND RINSE YOUR MOUTH OUT, NO CHEWING GUM CANDY OR MINTS.      CLEAR LIQUID DIET   Foods Allowed                                                                     Foods Excluded  Coffee and tea, regular and decaf                             liquids that you cannot  Plain Jell-O in any flavor                                             see through such as: Fruit ices (not with fruit pulp)                                     milk, soups, orange juice  Iced Popsicles                                    All solid food Carbonated beverages, regular and diet                                    Cranberry, grape and apple juices Sports drinks like Gatorade Lightly seasoned clear broth or consume(fat free) Sugar, honey syrup  Sample Menu Breakfast                                Lunch                                     Supper Cranberry juice                    Beef broth                            Chicken broth Jell-O  Grape juice                           Apple juice Coffee or tea                        Jell-O                                      Popsicle                                                Coffee or tea                        Coffee or tea  _____________________________________________________________________       Take these medicines the morning of surgery with A SIP OF WATER:  none                                You may not have any metal on your body including hair pins and              piercings  Do not wear jewelry, make-up, lotions, powders or perfumes, deodorant             Do not wear nail polish.  Do not shave  48 hours prior to surgery.                Do not bring valuables to the hospital. [facility] IS NOT             RESPONSIBLE   FOR VALUABLES.  Contacts, dentures or bridgework may not be worn into surgery.       Patients discharged the day of surgery will not be allowed to drive home.  Name and phone number of your driver:  Special Instructions: N/A              Please read over the following fact sheets you were given: _____________________________________________________________________             [facility] - Preparing for Surgery Before surgery, you can play an important role.  Because skin is not sterile, your skin needs to be as free of germs as possible.  You can reduce the number of germs on your skin by washing with CHG (chlorahexidine gluconate) soap before surgery.  CHG is an antiseptic cleaner which kills germs and bonds with the skin to continue killing germs even after washing. Please DO NOT use if you have an allergy to CHG or antibacterial soaps.  If your skin becomes reddened/irritated stop using the CHG and inform your nurse when you arrive at Short Stay. Do not shave (including legs and underarms) for at least 48 hours prior to the first CHG shower.  You may shave your face/neck. Please follow these instructions carefully:  1.  Shower with CHG Soap the night before surgery and the  morning of Surgery.  2.  If you choose to wash your hair, wash your hair first as usual with your  normal  shampoo.  3.  After you shampoo, rinse your hair and body thoroughly to remove the  shampoo.                           4.  Use CHG as you would any other liquid soap.  You can apply chg directly  to the skin and wash                        Gently with a scrungie or clean washcloth.  5.  Apply the CHG Soap to your body ONLY FROM THE NECK DOWN.   Do not use on face/ open                           Wound or open sores. Avoid contact with eyes, ears mouth and genitals (private parts).                       Wash face,  Genitals (private parts) with your normal soap.             6.  Wash thoroughly, paying special attention to the area where your surgery  will be performed.  7.  Thoroughly rinse your body with warm water from the neck down.  8.  DO NOT shower/wash with your normal soap after using and rinsing off  the CHG Soap.                9.  Pat yourself dry with a clean towel.            10.  Wear clean pajamas.            11.  Place clean sheets on your bed the night of your first shower and do not  sleep with pets. Day of Surgery : Do not apply any lotions/deodorants the morning of surgery.  Please wear clean clothes to the hospital/surgery center.  FAILURE TO FOLLOW THESE INSTRUCTIONS MAY RESULT IN THE CANCELLATION OF YOUR SURGERY PATIENT SIGNATURE_________________________________  NURSE SIGNATURE__________________________________  ________________________________________________________________________

## 2017-11-28 ENCOUNTER — Encounter (HOSPITAL_COMMUNITY)
Admission: RE | Admit: 2017-11-28 | Discharge: 2017-11-28 | Disposition: A | Discharge status: Home / Self Care | Payer: Medicaid Other | Source: Ambulatory Visit | Attending: General Surgery | Admitting: General Surgery

## 2017-11-28 ENCOUNTER — Encounter (HOSPITAL_COMMUNITY): Payer: Self-pay | Admitting: Emergency Medicine

## 2017-11-28 ENCOUNTER — Other Ambulatory Visit: Payer: Self-pay

## 2017-11-28 DIAGNOSIS — Z01812 Encounter for preprocedural laboratory examination: Secondary | ICD-10-CM | POA: Diagnosis present

## 2017-11-28 DIAGNOSIS — Z01810 Encounter for preprocedural cardiovascular examination: Secondary | ICD-10-CM | POA: Insufficient documentation

## 2017-11-28 DIAGNOSIS — E119 Type 2 diabetes mellitus without complications: Secondary | ICD-10-CM | POA: Diagnosis not present

## 2017-11-28 DIAGNOSIS — R739 Hyperglycemia, unspecified: Secondary | ICD-10-CM

## 2017-11-28 HISTORY — DX: Hyperglycemia, unspecified: R73.9

## 2017-11-28 HISTORY — DX: Personal history of other medical treatment: Z92.89

## 2017-11-28 HISTORY — DX: Migraine, unspecified, not intractable, without status migrainosus: G43.909

## 2017-11-28 LAB — BASIC METABOLIC PANEL
ANION GAP: 10 (ref 5–15)
BUN: 14 mg/dL (ref 6–20)
CO2: 27 mmol/L (ref 22–32)
Calcium: 9.5 mg/dL (ref 8.9–10.3)
Chloride: 101 mmol/L (ref 98–111)
Creatinine, Ser: 0.61 mg/dL (ref 0.44–1.00)
GFR calc non Af Amer: >60 mL/min (ref >60)
Glucose, Bld: 297 mg/dL — ABNORMAL HIGH (ref 70–99)
Potassium: 4.8 mmol/L (ref 3.5–5.1)
Sodium: 138 mmol/L (ref 135–145)

## 2017-11-28 LAB — CBC
HCT: 40.1 % (ref 36.0–46.0)
HEMOGLOBIN: 13.6 g/dL (ref 12.0–15.0)
MCH: 29.7 pg (ref 26.0–34.0)
MCHC: 33.9 g/dL (ref 30.0–36.0)
MCV: 87.6 fL (ref 78.0–100.0)
Platelets: 300 10*3/uL (ref 150–400)
RBC: 4.58 MIL/uL (ref 3.87–5.11)
RDW: 13.4 % (ref 11.5–15.5)
WBC: 7.8 10*3/uL (ref 4.0–10.5)

## 2017-11-28 LAB — GLUCOSE, CAPILLARY: Glucose-Capillary: 306 mg/dL — ABNORMAL HIGH (ref 70–99)

## 2017-11-28 LAB — HCG, SERUM, QUALITATIVE: PREG SERUM: NEGATIVE

## 2017-11-29 LAB — HEMOGLOBIN A1C
HEMOGLOBIN A1C: 10 % — AB (ref 4.8–5.6)
MEAN PLASMA GLUCOSE: 240 mg/dL

## 2017-11-29 NOTE — Progress Notes (Signed)
HgbA1c and BMP routed via epic to Dr Gaynelle Adu . RN will f/u as necessary

## 2017-12-03 IMAGING — US USMFM FETAL BPP W/O NON-STRESS ADDL GEST
1 series · 15 of 28 positions shown · non-contrast
Comparison: none

[Series 1: usmfm fetal bpp w/o non-stress addl gest · 15 of 98 slices shown]
[im 1/98]
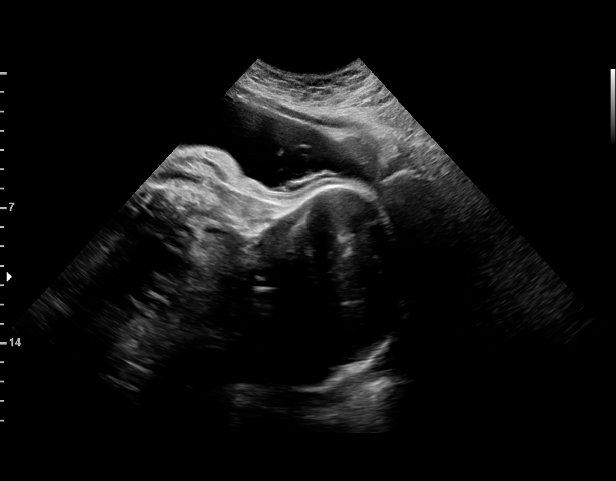
[im 8/98]
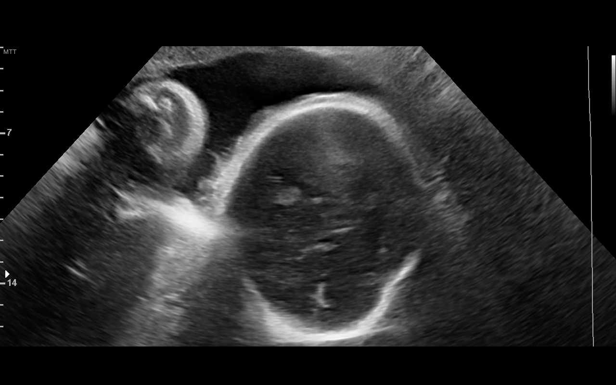
[im 15/98]
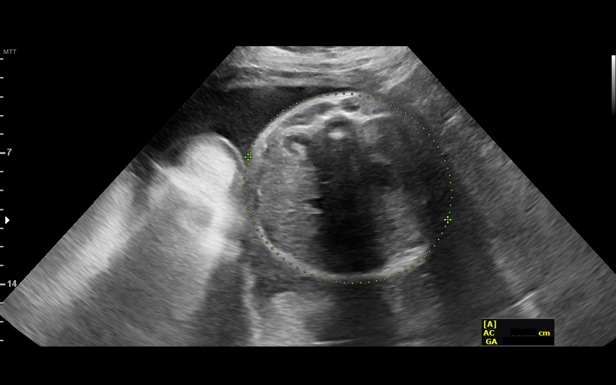
[im 22/98]
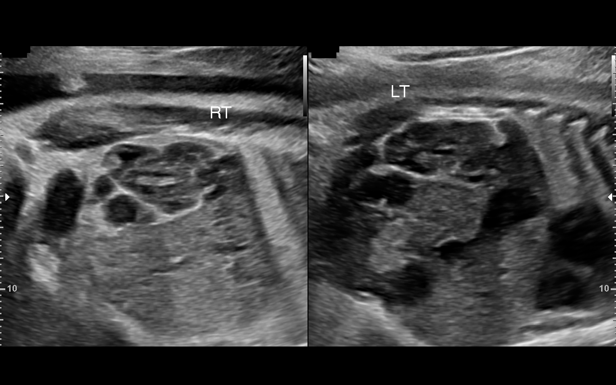
[im 29/98]
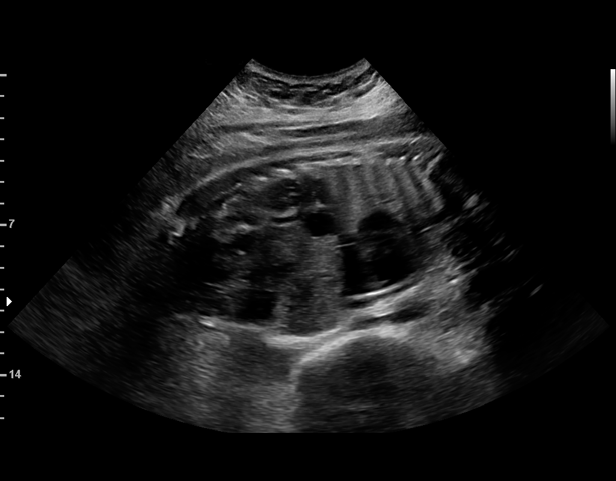
[im 36/98]
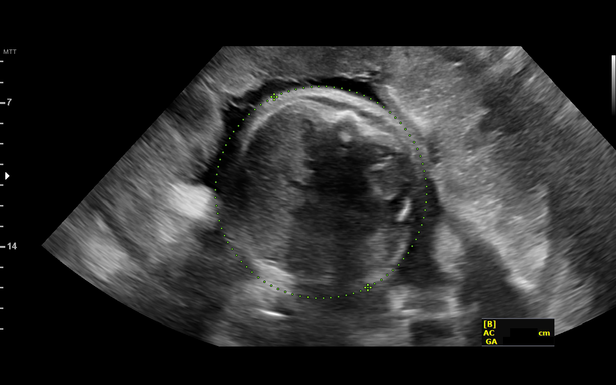
[im 44/98]
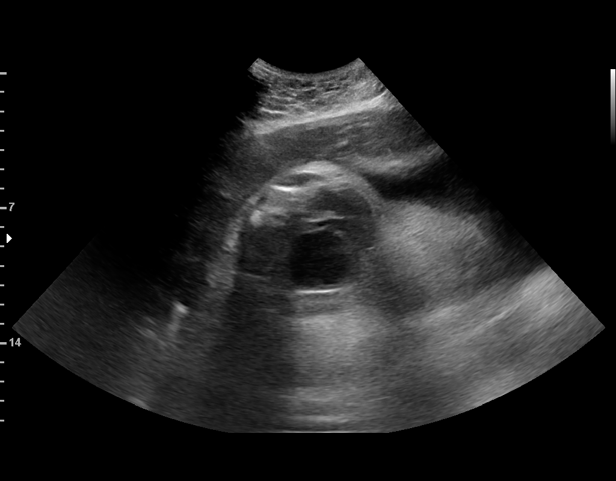
[im 51/98]
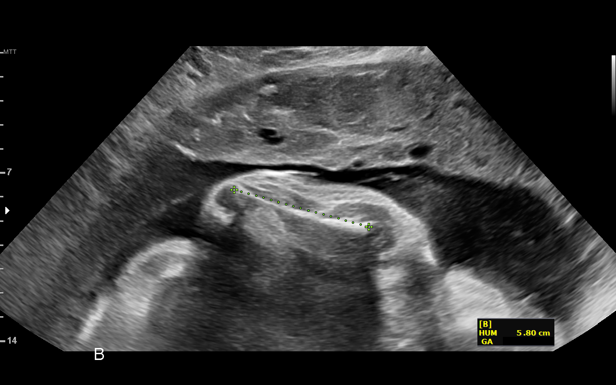
[im 54/98]
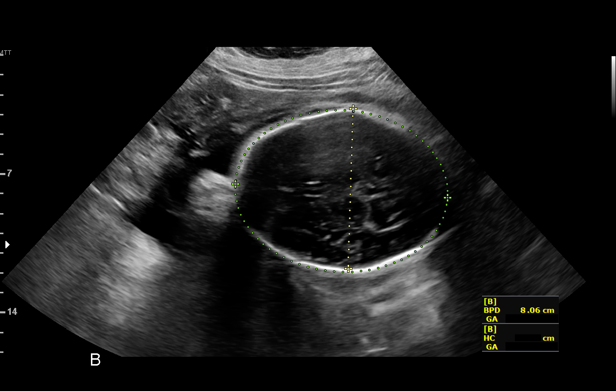
[im 62/98]
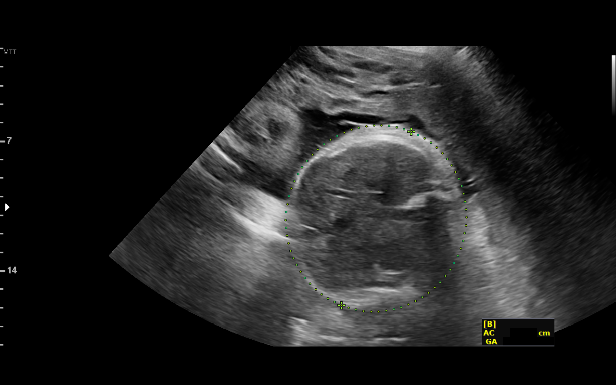
[im 69/98]
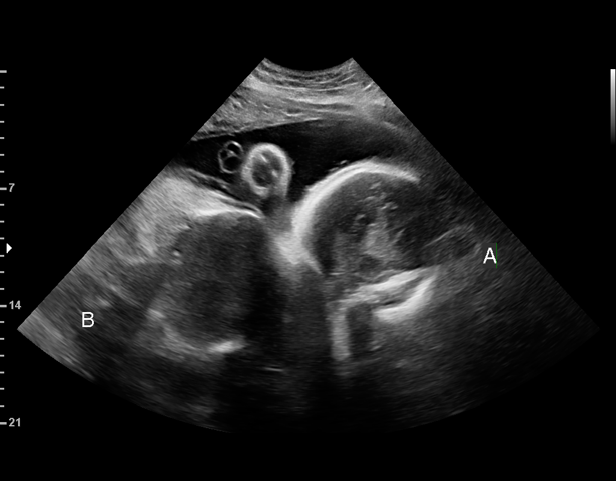
[im 76/98]
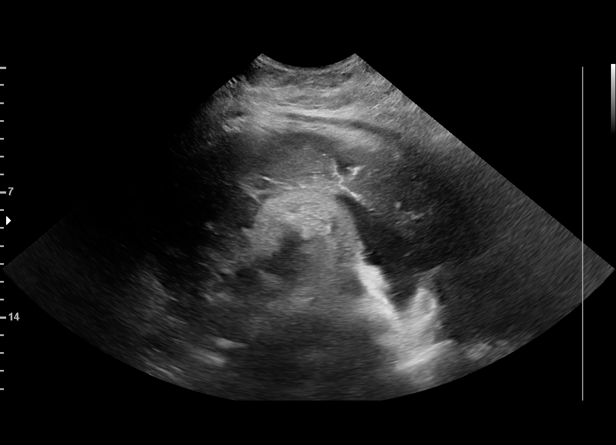
[im 83/98]
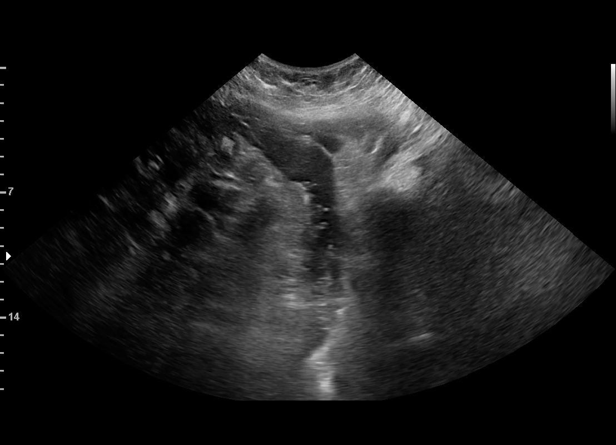
[im 90/98]
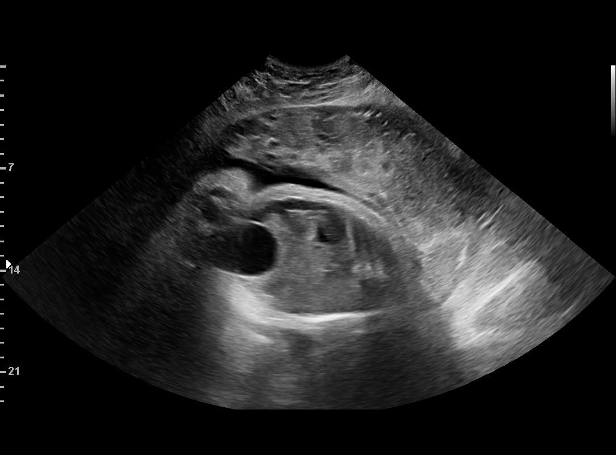
[im 98/98]
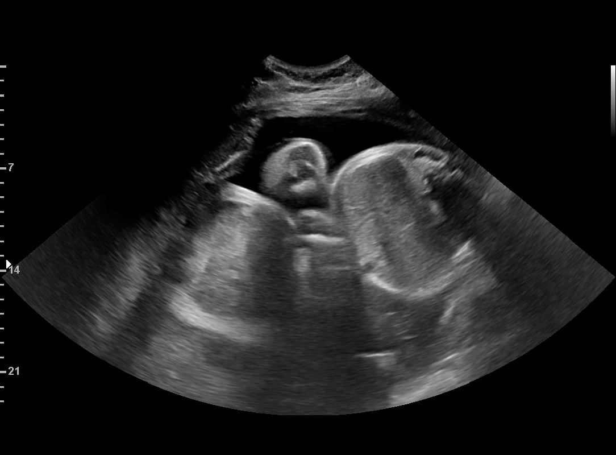

[15 of 28 positions shown; findings below may reference images not displayed]

GESTATION

1  CHELSIE JIM            [id number]      [id number]     [id number]
2  CHELSIE JIM            [id number]      [id number]     [id number]
3  SELVI JAM              [id number]      [id number]     [id number]
4  SELVI JAM              [id number]      [id number]     [id number]
Indications

34 weeks gestation of pregnancy
Twin pregnancy, di/di, third trimester
Obesity complicating pregnancy, third
trimester
Abnormal biochemical screen (quad) for
Trisomy 21-low risk NIPS
Pre-existing diabetes, type 2, in pregnancy,
third trimester (Metformin, Glyburide)
Encounter for other antenatal screening
follow-up
Decreased fetal movements, third trimester,
unspecified
OB History

Gravidity:    3         Term:   2        Prem:   0        SAB:   0
TOP:          0       Ectopic:  0        Living: 2
Fetal Evaluation (Fetus A)

Num Of Fetuses:     2
Fetal Heart         158
Rate(bpm):
Cardiac Activity:   Observed
Fetal Lie:          Lower Left Fetus
Presentation:       Cephalic
Placenta:           Anterior, above cervical os
P. Cord Insertion:  Previously Visualized

Amniotic Fluid
AFI FV:      Subjectively within normal limits

Largest Pocket(cm)
6.62
Biophysical Evaluation (Fetus A)

Amniotic F.V:   Within normal limits       F. Tone:        Observed
F. Movement:    Observed                   Score:          [DATE]
F. Breathing:   Not Observed
Biometry (Fetus A)

BPD:      87.2  mm     G. Age:  35w 1d         62  %    CI:        78.37   %   70 - 86
FL/HC:      22.7   %   20.1 -
HC:      311.6  mm     G. Age:  34w 6d         18  %    HC/AC:      0.95       0.93 -
AC:      327.9  mm     G. Age:  36w 5d         93  %    FL/BPD:     81.1   %   71 - 87
FL:       70.7  mm     G. Age:  36w 2d         77  %    FL/AC:      21.6   %   20 - 24
HUM:      56.2  mm     G. Age:  32w 5d         20  %

Est. FW:    9828  gm      6 lb 6 oz     82  %     FW Discordancy     0 \ 11 %
Gestational Age (Fetus A)

LMP:           34w 6d       Date:   12/10/15                 EDD:   09/15/16
U/S Today:     35w 5d                                        EDD:   09/09/16
Best:          34w 6d    Det. By:   LMP  (12/10/15)          EDD:   09/15/16
Anatomy (Fetus A)

Cranium:               Previously seen        Aortic Arch:            Previously seen
Cavum:                 Previously seen        Ductal Arch:            Previously seen
Ventricles:            Previously seen        Diaphragm:              Appears normal
Choroid Plexus:        Previously seen        Stomach:                Appears normal, left
sided
Cerebellum:            Previously seen        Abdomen:                Previously seen
Posterior Fossa:       Previously seen        Abdominal Wall:         Previously seen
Nuchal Fold:           Not applicable (>20    Cord Vessels:           Previously seen
wks GA)
Face:                  Orbits and profile     Kidneys:                Appear normal
previously seen
Lips:                  Previously seen        Bladder:                Appears normal
Thoracic:              Previously seen        Spine:                  Limited views
previously seen
Heart:                 Previously seen        Upper Extremities:      Previously seen
RVOT:                  Previously seen        Lower Extremities:      Previously seen
LVOT:                  Previously seen

Other:  Parents do not wish to know sex of fetus. Fetus appears to be a male.
Technically difficult due to maternal habitus.

Fetal Evaluation (Fetus B)

Num Of Fetuses:     2
Fetal Heart         168
Rate(bpm):
Cardiac Activity:   Observed
Fetal Lie:          Upper Right Fetus
Presentation:       Cephalic
Placenta:           Anterior, above cervical os
P. Cord Insertion:  Marginal insertion previously

Amniotic Fluid
AFI FV:      Subjectively within normal limits

Largest Pocket(cm)
4.4
Biophysical Evaluation (Fetus B)

Amniotic F.V:   Within normal limits       F. Tone:        Not Observed
F. Movement:    Observed                   Score:          [DATE]
F. Breathing:   Not Observed
Biometry (Fetus B)

BPD:      80.6  mm     G. Age:  32w 2d          3  %    CI:        73.46   %   70 - 86
FL/HC:      22.8   %   20.1 -
HC:      298.8  mm     G. Age:  33w 1d        < 3  %    HC/AC:      0.94       0.93 -
AC:       319   mm     G. Age:  35w 6d         81  %    FL/BPD:     84.4   %   71 - 87
FL:         68  mm     G. Age:  34w 6d         45  %    FL/AC:      21.3   %   20 - 24
HUM:      57.7  mm     G. Age:  33w 3d         37  %
CER:        46  mm     G. Age:  N/A          > 95  %

Est. FW:    3432  gm    5 lb 10 oz      62  %     FW Discordancy        11  %
Gestational Age (Fetus B)

LMP:           34w 6d       Date:   12/10/15                 EDD:   09/15/16
U/S Today:     34w 0d                                        EDD:   09/21/16
Best:          34w 6d    Det. By:   LMP  (12/10/15)          EDD:   09/15/16
Anatomy (Fetus B)

Cranium:               Previously seen        Aortic Arch:            Previously seen
Cavum:                 Previously seen        Ductal Arch:            Previously seen
Ventricles:            Previously seen        Diaphragm:              Appears normal
Choroid Plexus:        Previously seen        Stomach:                Appears normal, left
sided
Cerebellum:            Previously seen        Abdomen:                Previously seen
Posterior Fossa:       Previously seen        Abdominal Wall:         Previously seen
Nuchal Fold:           Not applicable (>20    Cord Vessels:           Previously seen
wks GA)
Face:                  Orbits and profile     Kidneys:                Appear normal
previously seen
Lips:                  Previously seen        Bladder:                Appears normal
Thoracic:              Previously seen        Spine:                  Previously seen
Heart:                 Previously seen        Upper Extremities:      Previously seen
RVOT:                  Previously seen        Lower Extremities:      Previously seen
LVOT:                  Previously seen

Other:  Parents do not wish to know sex of fetus. Fetus appears to be a
female. Nasal bone previously visualized. Technically difficult due to
maternal habitus and fetal position.
Cervix Uterus Adnexa
Cervix
Not visualized (advanced GA >67wks)

Uterus
No abnormality visualized.

Left Ovary
No adnexal mass visualized.

Right Ovary
No adnexal mass visualized.

Cul De Sac:   No free fluid seen.

Adnexa:       No abnormality visualized.
Impression

Dichorionic/diamniotic twin pregnancy at 34+6 weeks.
Reports decreased fetal movement x 2 days
Normal interval anatomy for both twins
Normal amniotic fluid volume for both twins with MVP 6.6cm
for A and 4.4cm for B
Appropriate interval growths with EFWs at the 82nd and 62nd
percentile, 11% discordance
BPPs are [DATE] for A and [DATE] for B (neither had breathing, B
had poor tone)
Recommendations

To MAREKH. Recommend NST/overnight observation and repeat
BPP in AM

## 2017-12-04 ENCOUNTER — Encounter (HOSPITAL_COMMUNITY): Payer: Self-pay | Admitting: *Deleted

## 2017-12-04 ENCOUNTER — Ambulatory Visit (HOSPITAL_COMMUNITY)
Admission: RE | Admit: 2017-12-04 | Discharge: 2017-12-04 | Disposition: A | Discharge status: Home / Self Care | Payer: Medicaid Other | Source: Ambulatory Visit | Attending: General Surgery | Admitting: General Surgery

## 2017-12-04 ENCOUNTER — Encounter (HOSPITAL_COMMUNITY)
Admission: RE | Disposition: A | Discharge status: Home / Self Care | Payer: Self-pay | Source: Ambulatory Visit | Attending: General Surgery

## 2017-12-04 ENCOUNTER — Ambulatory Visit (HOSPITAL_COMMUNITY): Payer: Medicaid Other | Admitting: Anesthesiology

## 2017-12-04 DIAGNOSIS — J449 Chronic obstructive pulmonary disease, unspecified: Secondary | ICD-10-CM | POA: Diagnosis not present

## 2017-12-04 DIAGNOSIS — K8010 Calculus of gallbladder with chronic cholecystitis without obstruction: Secondary | ICD-10-CM | POA: Insufficient documentation

## 2017-12-04 DIAGNOSIS — E669 Obesity, unspecified: Secondary | ICD-10-CM | POA: Insufficient documentation

## 2017-12-04 DIAGNOSIS — E119 Type 2 diabetes mellitus without complications: Secondary | ICD-10-CM | POA: Insufficient documentation

## 2017-12-04 DIAGNOSIS — K8020 Calculus of gallbladder without cholecystitis without obstruction: Secondary | ICD-10-CM | POA: Diagnosis present

## 2017-12-04 DIAGNOSIS — E1169 Type 2 diabetes mellitus with other specified complication: Secondary | ICD-10-CM

## 2017-12-04 DIAGNOSIS — Z6835 Body mass index (BMI) 35.0-35.9, adult: Secondary | ICD-10-CM | POA: Diagnosis not present

## 2017-12-04 DIAGNOSIS — K760 Fatty (change of) liver, not elsewhere classified: Secondary | ICD-10-CM | POA: Diagnosis not present

## 2017-12-04 DIAGNOSIS — Z87891 Personal history of nicotine dependence: Secondary | ICD-10-CM | POA: Diagnosis not present

## 2017-12-04 HISTORY — PX: CHOLECYSTECTOMY: SHX55

## 2017-12-04 LAB — GLUCOSE, CAPILLARY
GLUCOSE-CAPILLARY: 373 mg/dL — AB (ref 70–99)
GLUCOSE-CAPILLARY: 252 mg/dL — AB (ref 70–99)
GLUCOSE-CAPILLARY: 258 mg/dL — AB (ref 70–99)
Glucose-Capillary: 308 mg/dL — ABNORMAL HIGH (ref 70–99)
Glucose-Capillary: 284 mg/dL — ABNORMAL HIGH (ref 70–99)

## 2017-12-04 SURGERY — LAPAROSCOPIC CHOLECYSTECTOMY WITH INTRAOPERATIVE CHOLANGIOGRAM
Anesthesia: General | Site: Abdomen

## 2017-12-04 MED ORDER — PROPOFOL 10 MG/ML IV BOLUS
INTRAVENOUS | Status: AC
  Filled 2017-12-04: qty 20

## 2017-12-04 MED ORDER — ACETAMINOPHEN 500 MG PO TABS
1000.0000 mg | ORAL_TABLET | ORAL | Status: AC
  Administered 2017-12-04: 1000 mg via ORAL
  Filled 2017-12-04: qty 2

## 2017-12-04 MED ORDER — ONDANSETRON HCL 4 MG/2ML IJ SOLN: 4.0000 mg | Freq: Once | INTRAMUSCULAR | Status: DC | PRN

## 2017-12-04 MED ORDER — PROPOFOL 10 MG/ML IV BOLUS
INTRAVENOUS | Status: DC | PRN
  Administered 2017-12-04: 150 mg via INTRAVENOUS

## 2017-12-04 MED ORDER — MIDAZOLAM HCL 2 MG/2ML IJ SOLN
INTRAMUSCULAR | Status: AC
  Filled 2017-12-04: qty 2

## 2017-12-04 MED ORDER — PROPOFOL 10 MG/ML IV BOLUS
INTRAVENOUS | Status: AC
  Filled 2017-12-04: qty 60

## 2017-12-04 MED ORDER — LACTATED RINGERS IV SOLN
INTRAVENOUS | Status: DC
  Administered 2017-12-04 (×2): via INTRAVENOUS

## 2017-12-04 MED ORDER — CHLORHEXIDINE GLUCONATE CLOTH 2 % EX PADS: 6.0000 | MEDICATED_PAD | Freq: Once | CUTANEOUS | Status: DC

## 2017-12-04 MED ORDER — INSULIN ASPART 100 UNIT/ML ~~LOC~~ SOLN
SUBCUTANEOUS | Status: AC
  Administered 2017-12-04: 5 [IU] via SUBCUTANEOUS
  Filled 2017-12-04: qty 1

## 2017-12-04 MED ORDER — PROPOFOL 500 MG/50ML IV EMUL
INTRAVENOUS | Status: DC | PRN
  Administered 2017-12-04: 175 ug/kg/min via INTRAVENOUS

## 2017-12-04 MED ORDER — LIDOCAINE HCL (CARDIAC) PF 100 MG/5ML IV SOSY
PREFILLED_SYRINGE | INTRAVENOUS | Status: DC | PRN
  Administered 2017-12-04: 60 mg via INTRAVENOUS

## 2017-12-04 MED ORDER — DEXAMETHASONE SODIUM PHOSPHATE 10 MG/ML IJ SOLN
INTRAMUSCULAR | Status: DC | PRN
  Administered 2017-12-04: 10 mg via INTRAVENOUS

## 2017-12-04 MED ORDER — ROCURONIUM BROMIDE 100 MG/10ML IV SOLN
INTRAVENOUS | Status: DC | PRN
  Administered 2017-12-04: 10 mg via INTRAVENOUS
  Administered 2017-12-04: 50 mg via INTRAVENOUS

## 2017-12-04 MED ORDER — IOPAMIDOL (ISOVUE-300) INJECTION 61%
INTRAVENOUS | Status: AC
  Filled 2017-12-04: qty 50

## 2017-12-04 MED ORDER — SODIUM CHLORIDE 0.9 % IV SOLN
2.0000 g | INTRAVENOUS | Status: AC
  Administered 2017-12-04: 2 g via INTRAVENOUS
  Filled 2017-12-04: qty 2

## 2017-12-04 MED ORDER — SUGAMMADEX SODIUM 500 MG/5ML IV SOLN
INTRAVENOUS | Status: DC | PRN
  Administered 2017-12-04: 200 mg via INTRAVENOUS

## 2017-12-04 MED ORDER — FENTANYL CITRATE (PF) 100 MCG/2ML IJ SOLN
INTRAMUSCULAR | Status: AC
  Administered 2017-12-04: 25 ug via INTRAVENOUS
  Filled 2017-12-04: qty 2

## 2017-12-04 MED ORDER — MIDAZOLAM HCL 5 MG/5ML IJ SOLN
INTRAMUSCULAR | Status: DC | PRN
  Administered 2017-12-04 (×3): 1 mg via INTRAVENOUS

## 2017-12-04 MED ORDER — FENTANYL CITRATE (PF) 100 MCG/2ML IJ SOLN
INTRAMUSCULAR | Status: DC | PRN
  Administered 2017-12-04 (×4): 50 ug via INTRAVENOUS

## 2017-12-04 MED ORDER — OXYCODONE HCL 5 MG PO TABS
5.0000 mg | ORAL_TABLET | Freq: Once | ORAL | Status: AC | PRN
  Administered 2017-12-04: 5 mg via ORAL

## 2017-12-04 MED ORDER — GABAPENTIN 300 MG PO CAPS
300.0000 mg | ORAL_CAPSULE | ORAL | Status: AC
  Administered 2017-12-04: 300 mg via ORAL
  Filled 2017-12-04: qty 1

## 2017-12-04 MED ORDER — 0.9 % SODIUM CHLORIDE (POUR BTL) OPTIME
TOPICAL | Status: DC | PRN
  Administered 2017-12-04: 1000 mL

## 2017-12-04 MED ORDER — OXYCODONE HCL 5 MG PO TABS
ORAL_TABLET | ORAL | Status: AC
  Filled 2017-12-04: qty 1

## 2017-12-04 MED ORDER — ONDANSETRON HCL 4 MG/2ML IJ SOLN
INTRAMUSCULAR | Status: DC | PRN
  Administered 2017-12-04: 4 mg via INTRAVENOUS

## 2017-12-04 MED ORDER — OXYCODONE HCL 5 MG PO TABS: 5.0000 mg | ORAL_TABLET | Freq: Four times a day (QID) | ORAL | 0 refills | Status: DC | PRN

## 2017-12-04 MED ORDER — OXYCODONE HCL 5 MG/5ML PO SOLN: 5.0000 mg | Freq: Once | ORAL | Status: AC | PRN

## 2017-12-04 MED ORDER — LACTATED RINGERS IR SOLN
Status: DC | PRN
  Administered 2017-12-04: 1000 mL

## 2017-12-04 MED ORDER — FENTANYL CITRATE (PF) 100 MCG/2ML IJ SOLN
INTRAMUSCULAR | Status: AC
  Filled 2017-12-04: qty 2

## 2017-12-04 MED ORDER — SODIUM CHLORIDE 0.9 % IV SOLN
INTRAVENOUS | Status: AC
  Filled 2017-12-04: qty 2

## 2017-12-04 MED ORDER — INSULIN ASPART 100 UNIT/ML ~~LOC~~ SOLN
10.0000 [IU] | Freq: Once | SUBCUTANEOUS | Status: AC
  Administered 2017-12-04: 10 [IU] via SUBCUTANEOUS
  Filled 2017-12-04: qty 1

## 2017-12-04 MED ORDER — INSULIN ASPART 100 UNIT/ML ~~LOC~~ SOLN
5.0000 [IU] | Freq: Once | SUBCUTANEOUS | Status: AC
  Administered 2017-12-04: 5 [IU] via SUBCUTANEOUS

## 2017-12-04 MED ORDER — KETOROLAC TROMETHAMINE 30 MG/ML IJ SOLN
INTRAMUSCULAR | Status: DC | PRN
  Administered 2017-12-04: 30 mg via INTRAVENOUS

## 2017-12-04 MED ORDER — FENTANYL CITRATE (PF) 100 MCG/2ML IJ SOLN
25.0000 ug | INTRAMUSCULAR | Status: DC | PRN
  Administered 2017-12-04 (×2): 25 ug via INTRAVENOUS
  Administered 2017-12-04: 50 ug via INTRAVENOUS

## 2017-12-04 MED ORDER — BUPIVACAINE-EPINEPHRINE (PF) 0.5% -1:200000 IJ SOLN
INTRAMUSCULAR | Status: AC
  Filled 2017-12-04: qty 30

## 2017-12-04 MED ORDER — BUPIVACAINE-EPINEPHRINE (PF) 0.5% -1:200000 IJ SOLN
INTRAMUSCULAR | Status: DC | PRN
  Administered 2017-12-04: 30 mL

## 2017-12-04 SURGICAL SUPPLY — 56 items
ADH SKN CLS APL DERMABOND .7 (GAUZE/BANDAGES/DRESSINGS)
APL SKNCLS STERI-STRIP NONHPOA (GAUZE/BANDAGES/DRESSINGS) ×1
APL SRG 38 LTWT LNG FL B (MISCELLANEOUS)
APPLICATOR ARISTA FLEXITIP XL (MISCELLANEOUS) IMPLANT
APPLIER CLIP 5 13 M/L LIGAMAX5 (MISCELLANEOUS)
APPLIER CLIP ROT 10 11.4 M/L (STAPLE)
APR CLP MED LRG 11.4X10 (STAPLE)
APR CLP MED LRG 5 ANG JAW (MISCELLANEOUS)
BAG SPEC RTRVL 10 TROC 200 (ENDOMECHANICALS) ×1
BANDAGE ADH SHEER 1  50/CT (GAUZE/BANDAGES/DRESSINGS) ×6 IMPLANT
BENZOIN TINCTURE PRP APPL 2/3 (GAUZE/BANDAGES/DRESSINGS) ×2 IMPLANT
CABLE HIGH FREQUENCY MONO STRZ (ELECTRODE) ×3 IMPLANT
CHLORAPREP W/TINT 26ML (MISCELLANEOUS) ×3 IMPLANT
CLIP APPLIE 5 13 M/L LIGAMAX5 (MISCELLANEOUS) IMPLANT
CLIP APPLIE ROT 10 11.4 M/L (STAPLE) IMPLANT
CLIP VESOCCLUDE LG 6/CT (CLIP) ×2 IMPLANT
CLIP VESOLOCK MED LG 6/CT (CLIP) IMPLANT
CLOSURE WOUND 1/2 X4 (GAUZE/BANDAGES/DRESSINGS) ×1
COVER MAYO STAND STRL (DRAPES) IMPLANT
COVER SURGICAL LIGHT HANDLE (MISCELLANEOUS) ×3 IMPLANT
DECANTER SPIKE VIAL GLASS SM (MISCELLANEOUS) ×3 IMPLANT
DERMABOND ADVANCED (GAUZE/BANDAGES/DRESSINGS)
DERMABOND ADVANCED .7 DNX12 (GAUZE/BANDAGES/DRESSINGS) IMPLANT
DRAPE C-ARM 42X120 X-RAY (DRAPES) IMPLANT
DRSG TEGADERM 2-3/8X2-3/4 SM (GAUZE/BANDAGES/DRESSINGS) IMPLANT
ELECT PENCIL ROCKER SW 15FT (MISCELLANEOUS) IMPLANT
ELECT REM PT RETURN 15FT ADLT (MISCELLANEOUS) ×3 IMPLANT
GAUZE SPONGE 2X2 8PLY STRL LF (GAUZE/BANDAGES/DRESSINGS) IMPLANT
GLOVE BIO SURGEON STRL SZ7.5 (GLOVE) ×3 IMPLANT
GLOVE INDICATOR 8.0 STRL GRN (GLOVE) ×3 IMPLANT
GOWN STRL REUS W/TWL XL LVL3 (GOWN DISPOSABLE) ×9 IMPLANT
GRASPER SUT TROCAR 14GX15 (MISCELLANEOUS) ×2 IMPLANT
HEMOSTAT ARISTA ABSORB 3G PWDR (MISCELLANEOUS) IMPLANT
HEMOSTAT SNOW SURGICEL 2X4 (HEMOSTASIS) IMPLANT
KIT BASIN OR (CUSTOM PROCEDURE TRAY) ×3 IMPLANT
L-HOOK LAP DISP 36CM (ELECTROSURGICAL)
LHOOK LAP DISP 36CM (ELECTROSURGICAL) IMPLANT
POUCH RETRIEVAL ECOSAC 10 (ENDOMECHANICALS) ×1 IMPLANT
POUCH RETRIEVAL ECOSAC 10MM (ENDOMECHANICALS) ×2
SCISSORS LAP 5X35 DISP (ENDOMECHANICALS) ×3 IMPLANT
SET CHOLANGIOGRAPH MIX (MISCELLANEOUS) IMPLANT
SET IRRIG TUBING LAPAROSCOPIC (IRRIGATION / IRRIGATOR) ×3 IMPLANT
SLEEVE XCEL OPT CAN 5 100 (ENDOMECHANICALS) ×6 IMPLANT
SPONGE GAUZE 2X2 STER 10/PKG (GAUZE/BANDAGES/DRESSINGS)
STAPLER VISISTAT 35W (STAPLE) ×2 IMPLANT
STRIP CLOSURE SKIN 1/2X4 (GAUZE/BANDAGES/DRESSINGS) ×1 IMPLANT
SUT MNCRL AB 4-0 PS2 18 (SUTURE) ×3 IMPLANT
SUT VICRYL 0 TIES 12 18 (SUTURE) ×2 IMPLANT
SUT VICRYL 0 UR6 27IN ABS (SUTURE) IMPLANT
TOWEL OR 17X26 10 PK STRL BLUE (TOWEL DISPOSABLE) ×3 IMPLANT
TOWEL OR NON WOVEN STRL DISP B (DISPOSABLE) ×3 IMPLANT
TRAY LAPAROSCOPIC (CUSTOM PROCEDURE TRAY) ×3 IMPLANT
TROCAR BLADELESS OPT 5 100 (ENDOMECHANICALS) ×3 IMPLANT
TROCAR XCEL BLUNT TIP 100MML (ENDOMECHANICALS) IMPLANT
TROCAR XCEL NON-BLD 11X100MML (ENDOMECHANICALS) IMPLANT
TUBING INSUF HEATED (TUBING) ×3 IMPLANT

## 2017-12-04 NOTE — Op Note (Signed)
Kathy Sandoval [id number] 07/23/1985 12/04/2017  Laparoscopic Cholecystectomy Procedure Note  Indications: This patient presents with symptomatic gallbladder disease and will undergo laparoscopic cholecystectomy.  Pre-operative Diagnosis: symptomatic cholelithiasis  Post-operative Diagnosis: chronic calculous cholecystitis  Surgeon: Gaynelle Adu MD FACS  Assistants: none  Anesthesia: General endotracheal anesthesia  Procedure Details  The patient was seen again in the Holding Room. The risks, benefits, complications, treatment options, and expected outcomes were discussed with the patient. The possibilities of reaction to medication, pulmonary aspiration, perforation of viscus, bleeding, recurrent infection, finding a normal gallbladder, the need for additional procedures, failure to diagnose a condition, the possible need to convert to an open procedure, and creating a complication requiring transfusion or operation were discussed with the patient. The likelihood of improving the patient's symptoms with return to their baseline status is good.  The patient and/or family concurred with the proposed plan, giving informed consent. The site of surgery properly noted. The patient was taken to Operating Room, identified as Kathy Sandoval and the procedure verified as Laparoscopic Cholecystectomy. A Time Out was held and the above information confirmed. Antibiotic prophylaxis was administered.  She received an enhanced recovery after surgery medication preoperatively  Prior to the induction of general anesthesia, antibiotic prophylaxis was administered. General endotracheal anesthesia was then administered and tolerated well. After the induction, the abdomen was prepped with Chloraprep and draped in the sterile fashion. The patient was positioned in the supine position.  Local anesthetic agent was injected into the skin near the umbilicus and an incision made. We dissected down to the abdominal fascia  with blunt dissection.  The fascia was incised vertically and we entered the peritoneal cavity bluntly.  A pursestring suture of 0-Vicryl was placed around the fascial opening.  The Hasson cannula was inserted and secured with the stay suture.  Pneumoperitoneum was then created with CO2 and tolerated well without any adverse changes in the patient's vital signs. An 5-mm port was placed in the subxiphoid position.  Two 5-mm ports were placed in the right upper quadrant. All skin incisions were infiltrated with a local anesthetic agent before making the incision and placing the trocars.   We positioned the patient in reverse Trendelenburg, tilted slightly to the patient's left.  Patient had a fat appearing liver.  The gallbladder was identified, the fundus grasped and retracted cephalad. Adhesions were lysed bluntly and with the electrocautery where indicated, taking care not to injure any adjacent organs or viscus. The infundibulum was grasped and retracted laterally, exposing the peritoneum overlying the triangle of Calot. This was then divided and exposed in a blunt fashion. A critical view of the cystic duct and cystic artery was obtained.    The cystic duct was then ligated with hemoclips and divided. The cystic artery which had been identified & dissected free was ligated with hemoclips and divided as well.   The gallbladder was dissected from the liver bed in retrograde fashion with the electrocautery. The gallbladder was removed and placed in an Ecco sac.  The gallbladder and Ecco sac were then removed through the umbilical port site. The liver bed was irrigated and inspected. Hemostasis was achieved with the electrocautery. Copious irrigation was utilized and was repeatedly aspirated until clear.  The pursestring suture was used to close the umbilical fascia.  I did place 2 additional interrupted 0 Vicryl sutures at the umbilical fascia using the PMI suture passer with laparoscopic assistance given her  obesity.  We again inspected the right upper quadrant for hemostasis.  The  umbilical closure was inspected and there was no air leak and nothing trapped within the closure. Pneumoperitoneum was released as we removed the trocars.  4-0 Monocryl was used to close the skin.   Benzoin, steri-strips, and clean dressings were applied. The patient was then extubated and brought to the recovery room in stable condition. Instrument, sponge, and needle counts were correct at closure and at the conclusion of the case.   Findings: Chronic cholecystitis with Cholelithiasis; positive critical view; fatty liver  Estimated Blood Loss: Minimal         Drains: None         Specimens: Gallbladder           Complications: None; patient tolerated the procedure well.         Disposition: PACU - hemodynamically stable.         Condition: stable  Mary Sella. Andrey Campanile, MD, FACS General, Bariatric, & Minimally Invasive Surgery [facility], [place]

## 2017-12-04 NOTE — Anesthesia Postprocedure Evaluation (Signed)
Anesthesia Post Note  Patient: Kathy Sandoval  Procedure(s) Performed: LAPAROSCOPIC CHOLECYSTECTOMY WITH ERAS PATHWAY (N/A Abdomen)     Patient location during evaluation: PACU Anesthesia Type: General Level of consciousness: awake and alert Pain management: pain level controlled Vital Signs Assessment: post-procedure vital signs reviewed and stable Respiratory status: spontaneous breathing, nonlabored ventilation, respiratory function stable and patient connected to nasal cannula oxygen Cardiovascular status: blood pressure returned to baseline and stable Postop Assessment: no apparent nausea or vomiting Anesthetic complications: no    Last Vitals:  Vitals:   12/04/17 1015 12/04/17 1027  BP: 121/73 102/70  Pulse: 100 (!) 103  Resp: 13 16  Temp: 36.8 C 36.6 C  SpO2: 97% 95%    Last Pain:  Vitals:   12/04/17 1015  TempSrc:   PainSc: Asleep                 Loree Shehata COKER

## 2017-12-04 NOTE — Transfer of Care (Signed)
Immediate Anesthesia Transfer of Care Note  Patient: Kathy Sandoval  Procedure(s) Performed: LAPAROSCOPIC CHOLECYSTECTOMY WITH ERAS PATHWAY (N/A Abdomen)  Patient Location: PACU  Anesthesia Type:General  Level of Consciousness: drowsy, pateint uncooperative, confused and lethargic  Airway & Oxygen Therapy: Patient Spontanous Breathing and Patient connected to face mask oxygen  Post-op Assessment: Report given to RN, Post -op Vital signs reviewed and stable and Patient moving all extremities  Post vital signs: Reviewed and stable  Last Vitals:  Vitals Value Taken Time  BP 105/70 12/04/2017  9:05 AM  Temp 36.4 C 12/04/2017  9:03 AM  Pulse 96 12/04/2017  9:13 AM  Resp 19 12/04/2017  9:13 AM  SpO2 100 % 12/04/2017  9:13 AM  Vitals shown include unvalidated device data.  Last Pain:  Vitals:   12/04/17 0903  TempSrc:   PainSc: Asleep         Complications: No apparent anesthesia complications

## 2017-12-04 NOTE — Discharge Instructions (Signed)
[facility] °LAPAROSCOPIC SURGERY: POST OP INSTRUCTIONS °Always review your discharge instruction sheet given to you by the facility where your surgery was performed. °IF YOU HAVE DISABILITY OR FAMILY LEAVE FORMS, YOU MUST BRING THEM TO THE OFFICE FOR PROCESSING.   °DO NOT GIVE THEM TO YOUR DOCTOR. ° °PAIN CONTROL ° °1. First take acetaminophen (Tylenol) AND/or ibuprofen (Advil) to control your pain after surgery.  Follow directions on package.  Taking acetaminophen (Tylenol) and/or ibuprofen (Advil) regularly after surgery will help to control your pain and lower the amount of prescription pain medication you may need.  You should not take more than 4,000 mg (4 grams) of acetaminophen (Tylenol) in 24 hours.  You should not take ibuprofen (Advil), aleve, motrin, naprosyn or other NSAIDS if you have a history of stomach ulcers or chronic kidney disease.  °2. A prescription for pain medication may be given to you upon discharge.  Take your pain medication as prescribed, if you still have uncontrolled pain after taking acetaminophen (Tylenol) or ibuprofen (Advil). °3. Use ice packs to help control pain. °4. If you need a refill on your pain medication, please contact your pharmacy.  They will contact our office to request authorization. Prescriptions will not be filled after 5pm or on week-ends. ° °HOME MEDICATIONS °5. Take your usually prescribed medications unless otherwise directed. ° °DIET °6. You should follow a light diet the first few days after arrival home.  Be sure to include lots of fluids daily. Avoid fatty, fried foods.  ° °CONSTIPATION °7. It is common to experience some constipation after surgery and if you are taking pain medication.  Increasing fluid intake and taking a stool softener (such as Colace) will usually help or prevent this problem from occurring.  A mild laxative (Milk of Magnesia or Miralax) should be taken according to package instructions if there are no bowel  movements after 48 hours. ° °WOUND/INCISION CARE °8. Most patients will experience some swelling and bruising in the area of the incisions.  Ice packs will help.  Swelling and bruising can take several days to resolve.  °9. Unless discharge instructions indicate otherwise, follow guidelines below  °a. STERI-STRIPS - you may remove your outer bandages 48 hours after surgery, and you may shower at that time.  You have steri-strips (small skin tapes) in place directly over the incision.  These strips should be left on the skin for 7-10 days.   °b. DERMABOND/SKIN GLUE - you may shower in 24 hours.  The glue will flake off over the next 2-3 weeks. °10. Any sutures or staples will be removed at the office during your follow-up visit. ° °ACTIVITIES °11. You may resume regular (light) daily activities beginning the next day--such as daily self-care, walking, climbing stairs--gradually increasing activities as tolerated.  You may have sexual intercourse when it is comfortable.  Refrain from any heavy lifting or straining until approved by your doctor. °a. You may drive when you are no longer taking prescription pain medication, you can comfortably wear a seatbelt, and you can safely maneuver your car and apply brakes. ° °FOLLOW-UP °12. You should see your doctor in the office for a follow-up appointment approximately 2-3 weeks after your surgery.  You should have been given your post-op/follow-up appointment when your surgery was scheduled.  If you did not receive a post-op/follow-up appointment, make sure that you call for this appointment within a day or two after you arrive home to insure a convenient appointment time. ° °OTHER   INSTRUCTIONS 13.   WHEN TO CALL YOUR DOCTOR: 1. Fever over 101.0 2. Inability to urinate 3. Continued bleeding from incision. 4. Increased pain, redness, or drainage from the incision. 5. Increasing abdominal pain  The clinic staff is available to answer your questions during regular  business hours.  Please dont hesitate to call and ask to speak to one of the nurses for clinical concerns.  If you have a medical emergency, go to the nearest emergency room or call 911.  A surgeon from [facility] is always on call at the hospital. [address], [place], [place]  [address] ? [address] [address], [place], [place]   [address] [phone] ? [phone] ? FAX [phone] Web site: [email or web address]   Diabetes Mellitus and Nutrition When you have diabetes (diabetes mellitus), it is very important to have healthy eating habits because your blood sugar (glucose) levels are greatly affected by what you eat and drink. Eating healthy foods in the appropriate amounts, at about the same times every day, can help you:  Control your blood glucose.  Lower your risk of heart disease.  Improve your blood pressure.  Reach or maintain a healthy weight.  Every person with diabetes is different, and each person has different needs for a meal plan. Your health care provider may recommend that you work with a diet and nutrition specialist (dietitian) to make a meal plan that is best for you. Your meal plan may vary depending on factors such as:  The calories you need.  The medicines you take.  Your weight.  Your blood glucose, blood pressure, and cholesterol levels.  Your activity level.  Other health conditions you have, such as heart or kidney disease.  How do carbohydrates affect me? Carbohydrates affect your blood glucose level more than any other type of food. Eating carbohydrates naturally increases the amount of glucose in your blood. Carbohydrate counting is a method for keeping track of how many carbohydrates you eat. Counting carbohydrates is important to keep your blood glucose at a healthy level, especially if you use insulin or take certain oral diabetes medicines. It is important to know how many carbohydrates you can safely have in  each meal. This is different for every person. Your dietitian can help you calculate how many carbohydrates you should have at each meal and for snack. Foods that contain carbohydrates include:  Bread, cereal, rice, pasta, and crackers.  Potatoes and corn.  Peas, beans, and lentils.  Milk and yogurt.  Fruit and juice.  Desserts, such as cakes, cookies, ice cream, and candy.  How does alcohol affect me? Alcohol can cause a sudden decrease in blood glucose (hypoglycemia), especially if you use insulin or take certain oral diabetes medicines. Hypoglycemia can be a life-threatening condition. Symptoms of hypoglycemia (sleepiness, dizziness, and confusion) are similar to symptoms of having too much alcohol. If your health care provider says that alcohol is safe for you, follow these guidelines:  Limit alcohol intake to no more than 1 drink per day for nonpregnant women and 2 drinks per day for men. One drink equals 12 oz of beer, 5 oz of wine, or 1 oz of hard liquor.  Do not drink on an empty stomach.  Keep yourself hydrated with water, diet soda, or unsweetened iced tea.  Keep in mind that regular soda, juice, and other mixers may contain a lot of sugar and must be counted as carbohydrates.  What are tips for following this plan? Reading food labels  Start by checking the serving size on the label. The amount of calories, carbohydrates, fats, and other nutrients listed on the label are based on one serving of the food. Many foods contain more than one serving per package.  Check the total grams (g) of carbohydrates in one serving. You can calculate the number of servings of carbohydrates in one serving by dividing the total carbohydrates by 15. For example, if a food has 30 g of total carbohydrates, it would be equal to 2 servings of carbohydrates.  Check the number of grams (g) of saturated and trans fats in one serving. Choose foods that have low or no amount of these fats.  Check  the number of milligrams (mg) of sodium in one serving. Most people should limit total sodium intake to less than 2,300 mg per day.  Always check the nutrition information of foods labeled as "low-fat" or "nonfat". These foods may be higher in added sugar or refined carbohydrates and should be avoided.  Talk to your dietitian to identify your daily goals for nutrients listed on the label. Shopping  Avoid buying canned, premade, or processed foods. These foods tend to be high in fat, sodium, and added sugar.  Shop around the outside edge of the grocery store. This includes fresh fruits and vegetables, bulk grains, fresh meats, and fresh dairy. Cooking  Use low-heat cooking methods, such as baking, instead of high-heat cooking methods like deep frying.  Cook using healthy oils, such as olive, canola, or sunflower oil.  Avoid cooking with butter, cream, or high-fat meats. Meal planning  Eat meals and snacks regularly, preferably at the same times every day. Avoid going long periods of time without eating.  Eat foods high in fiber, such as fresh fruits, vegetables, beans, and whole grains. Talk to your dietitian about how many servings of carbohydrates you can eat at each meal.  Eat 4-6 ounces of lean protein each day, such as lean meat, chicken, fish, eggs, or tofu. 1 ounce is equal to 1 ounce of meat, chicken, or fish, 1 egg, or 1/4 cup of tofu.  Eat some foods each day that contain healthy fats, such as avocado, nuts, seeds, and fish. Lifestyle   Check your blood glucose regularly.  Exercise at least 30 minutes 5 or more days each week, or as told by your health care provider.  Take medicines as told by your health care provider.  Do not use any products that contain nicotine or tobacco, such as cigarettes and e-cigarettes. If you need help quitting, ask your health care provider.  Work with a Veterinary surgeon or diabetes educator to identify strategies to manage stress and any emotional  and social challenges. What are some questions to ask my health care provider?  Do I need to meet with a diabetes educator?  Do I need to meet with a dietitian?  What number can I call if I have questions?  When are the best times to check my blood glucose? Where to find more information:  American Diabetes Association: diabetes.org/food-and-fitness/food  Academy of Nutrition and Dietetics: https://www.vargas.com/  [employer] of Diabetes and Digestive and Kidney Diseases (NIH): FindJewelers.cz Summary  A healthy meal plan will help you control your blood glucose and maintain a healthy lifestyle.  Working with a diet and nutrition specialist (dietitian) can help you make a meal plan that is best for you.  Keep in mind that carbohydrates and alcohol have immediate effects on your blood glucose levels. It is important to count  carbohydrates and to use alcohol carefully. This information is not intended to replace advice given to you by your health care provider. Make sure you discuss any questions you have with your health care provider. Document Released: 11/11/2004 Document Revised: 03/21/2016 Document Reviewed: 03/21/2016 Elsevier Interactive Patient Education  [employer].

## 2017-12-04 NOTE — Anesthesia Procedure Notes (Signed)
Procedure Name: Intubation Date/Time: 12/04/2017 7:39 AM Performed by: Roberts Gaudy, MD Pre-anesthesia Checklist: Patient identified, Emergency Drugs available, Suction available, Timeout performed and Patient being monitored Patient Re-evaluated:Patient Re-evaluated prior to induction Oxygen Delivery Method: Circle system utilized Preoxygenation: Pre-oxygenation with 100% oxygen Induction Type: IV induction Ventilation: Mask ventilation without difficulty Laryngoscope Size: Mac and 4 Grade View: Grade II Tube type: Oral Tube size: 7.0 mm Number of attempts: 1 Airway Equipment and Method: Stylet Placement Confirmation: ETT inserted through vocal cords under direct vision,  positive ETCO2 and breath sounds checked- equal and bilateral Secured at: 21 cm Tube secured with: Tape Dental Injury: Teeth and Oropharynx as per pre-operative assessment

## 2017-12-04 NOTE — Interval H&P Note (Signed)
History and Physical Interval Note:  12/04/2017 7:12 AM  Kathy Sandoval  has presented today for surgery, with the diagnosis of symptomatic cholelithiasis  The various methods of treatment have been discussed with the patient and family. After consideration of risks, benefits and other options for treatment, the patient has consented to  Procedure(s): LAPAROSCOPIC CHOLECYSTECTOMY WITH POSSIBLE  INTRAOPERATIVE CHOLANGIOGRAM ERAS PATHWAY (N/A) as a surgical intervention .  The patient's history has been reviewed, patient examined, no change in status, stable for surgery.  I have reviewed the patient's chart and labs.  Questions were answered to the patient's satisfaction.    Discussed A1C with pt. She has appt with PCP in 2 weeks  Mary Sella. Andrey Campanile, MD, FACS General, Bariatric, & Minimally Invasive Surgery [facility]  Gaynelle Adu

## 2017-12-04 NOTE — Anesthesia Preprocedure Evaluation (Signed)
Anesthesia Evaluation  Patient identified by MRN, date of birth, ID band Patient awake    Reviewed: Allergy & Precautions, NPO status , Patient's Chart, lab work & pertinent test results  Airway Mallampati: II  TM Distance: >3 FB Neck ROM: Full    Dental  (+) Teeth Intact, Dental Advisory Given   Pulmonary former smoker,    breath sounds clear to auscultation       Cardiovascular hypertension,  Rhythm:Regular Rate:Normal     Neuro/Psych    GI/Hepatic   Endo/Other  diabetes  Renal/GU      Musculoskeletal   Abdominal   Peds  Hematology   Anesthesia Other Findings   Reproductive/Obstetrics                             Anesthesia Physical Anesthesia Plan  ASA: II  Anesthesia Plan: General   Post-op Pain Management:    Induction: Intravenous  PONV Risk Score and Plan: Ondansetron and Dexamethasone  Airway Management Planned: Oral ETT  Additional Equipment:   Intra-op Plan:   Post-operative Plan: Extubation in OR  Informed Consent: I have reviewed the patients History and Physical, chart, labs and discussed the procedure including the risks, benefits and alternatives for the proposed anesthesia with the patient or authorized representative who has indicated his/her understanding and acceptance.     Dental advisory given  Plan Discussed with: CRNA and Anesthesiologist  Anesthesia Plan Comments:         Anesthesia Quick Evaluation  

## 2017-12-04 NOTE — Progress Notes (Signed)
Notified Dr Noreene Larsson of patient blood sugar 258. No new orders received. Ok for patient to be discharged.

## 2017-12-05 ENCOUNTER — Encounter (HOSPITAL_COMMUNITY): Payer: Self-pay | Admitting: General Surgery

## 2019-07-05 ENCOUNTER — Encounter (HOSPITAL_COMMUNITY): Payer: Self-pay

## 2019-07-05 ENCOUNTER — Ambulatory Visit (HOSPITAL_COMMUNITY)
Admission: EM | Admit: 2019-07-05 | Discharge: 2019-07-05 | Disposition: A | Discharge status: Home / Self Care | Payer: Medicaid Other | Attending: Internal Medicine | Admitting: Internal Medicine

## 2019-07-05 ENCOUNTER — Other Ambulatory Visit: Payer: Self-pay

## 2019-07-05 DIAGNOSIS — J029 Acute pharyngitis, unspecified: Secondary | ICD-10-CM | POA: Diagnosis not present

## 2019-07-05 DIAGNOSIS — R067 Sneezing: Secondary | ICD-10-CM | POA: Insufficient documentation

## 2019-07-05 DIAGNOSIS — J309 Allergic rhinitis, unspecified: Secondary | ICD-10-CM

## 2019-07-05 DIAGNOSIS — H1010 Acute atopic conjunctivitis, unspecified eye: Secondary | ICD-10-CM | POA: Insufficient documentation

## 2019-07-05 DIAGNOSIS — Z79899 Other long term (current) drug therapy: Secondary | ICD-10-CM | POA: Diagnosis not present

## 2019-07-05 DIAGNOSIS — Z793 Long term (current) use of hormonal contraceptives: Secondary | ICD-10-CM | POA: Insufficient documentation

## 2019-07-05 DIAGNOSIS — E119 Type 2 diabetes mellitus without complications: Secondary | ICD-10-CM | POA: Diagnosis not present

## 2019-07-05 DIAGNOSIS — Z7984 Long term (current) use of oral hypoglycemic drugs: Secondary | ICD-10-CM | POA: Insufficient documentation

## 2019-07-05 DIAGNOSIS — Z87891 Personal history of nicotine dependence: Secondary | ICD-10-CM | POA: Diagnosis not present

## 2019-07-05 DIAGNOSIS — R0981 Nasal congestion: Secondary | ICD-10-CM | POA: Diagnosis not present

## 2019-07-05 DIAGNOSIS — Z20822 Contact with and (suspected) exposure to covid-19: Secondary | ICD-10-CM | POA: Diagnosis not present

## 2019-07-05 DIAGNOSIS — J310 Chronic rhinitis: Secondary | ICD-10-CM | POA: Diagnosis not present

## 2019-07-05 DIAGNOSIS — E669 Obesity, unspecified: Secondary | ICD-10-CM | POA: Diagnosis not present

## 2019-07-05 MED ORDER — FLUTICASONE PROPIONATE 50 MCG/ACT NA SUSP: 1.0000 | Freq: Every day | NASAL | 1 refills | Status: AC

## 2019-07-05 MED ORDER — LORATADINE 10 MG PO TABS: 10.0000 mg | ORAL_TABLET | Freq: Every day | ORAL | Status: AC

## 2019-07-05 MED ORDER — OLOPATADINE HCL 0.1 % OP SOLN: 1.0000 [drp] | Freq: Two times a day (BID) | OPHTHALMIC | 12 refills | Status: AC

## 2019-07-05 NOTE — ED Triage Notes (Signed)
Pt c/o acute onset sneezing, watery/sensitive eyes, runny nose, congestion, HA, sore throat  two days ago.   Took benadryl yesterday afternoon.   Denies cough, abdom pain, n/v/d, body aches, fever or chills.  Denies h/o seasonal allergies.

## 2019-07-05 NOTE — ED Provider Notes (Signed)
[facility]    CSN: [id number] Arrival date & time: 07/05/19  5176      History   Chief Complaint Chief Complaint  Patient presents with  . Sore Throat  . Nasal Congestion    HPI Kathy Sandoval is a 34 y.o. female comes to urgent care with complaints of fairly sudden onset sneezing, itchy nose and watery eyes as well as scratchy throat over the past couple of days.  Patient denies any fever, chills, shortness of breath, nausea or vomiting.  No sick contacts.  No history of seasonal allergies.  Patient  completed the Pfizer COVID-19 vaccine series several weeks ago. HPI  Past Medical History:  Diagnosis Date  . Anemia   . Diabetes in pregnancy 11/08/2010    patient reprots she was told she was prediabetic; then when she became pregnany, she was told she was diabetic and was taking metformin; she says she ran out of the metformin and has been back to her PCP yet for a new rx; CBG today was 306 , she admits to eating a moon pie 30 mintues before coming to her pre-op appt today 11-28-17  . Elevated blood sugar 11/28/2017   at pre-op ; 306  . History of blood transfusion    no reaction   . Migraine    with N/V , last occurrence was 11-26-17  . Placental abruption in third trimester   . Postpartum hypertension 08/14/2016    Patient Active Problem List   Diagnosis Date Noted  . Supervision of high risk pregnancy in first trimester 02/17/2016  . Type 2 diabetes mellitus in pregnancy 02/08/2016  . Obesity 04/12/2010  . MIGRAINE, UNSPEC., W/O INTRACTABLE MIGRAINE 04/27/2006    Past Surgical History:  Procedure Laterality Date  . CESAREAN SECTION MULTI-GESTATIONAL N/A 08/10/2016   Procedure: CESAREAN SECTION MULTI-GESTATIONAL;  Surgeon: Lazaro Arms, MD;  Location: [facility];  Service: Obstetrics;  Laterality: N/A;  . CHOLECYSTECTOMY N/A 12/04/2017   Procedure: LAPAROSCOPIC CHOLECYSTECTOMY WITH ERAS PATHWAY;  Surgeon: Gaynelle Adu, MD;  Location: [facility];   Service: General;  Laterality: N/A;  . NO PAST SURGERIES      OB History    Gravida  3   Para  3   Term  2   Preterm  1   AB  0   Living  4     SAB  0   TAB  0   Ectopic  0   Multiple  1   Live Births  4            Home Medications    Prior to Admission medications   Medication Sig Start Date End Date Taking? Authorizing Provider  JARDIANCE 10 MG TABS tablet Take 10 mg by mouth daily. 06/14/19  Yes [provider]  levonorgestrel (MIRENA) 20 MCG/24HR IUD 1 each by Intrauterine route once. 08/28/16  Yes [provider]  metFORMIN (GLUCOPHAGE) 500 MG tablet Take 1 tablet (500 mg total) by mouth 2 (two) times daily with a meal. 07/05/16  Yes Lorne Skeens, MD  fluticasone [facility]) 50 MCG/ACT nasal spray Place 1 spray into both nostrils daily. 07/05/19   Merrilee Jansky, MD  loratadine (CLARITIN) 10 MG tablet Take 1 tablet (10 mg total) by mouth daily. 07/05/19   Keera Altidor, Britta Mccreedy, MD  olopatadine (PATANOL) 0.1 % ophthalmic solution Place 1 drop into both eyes 2 (two) times daily. 07/05/19   Steven Veazie, Britta Mccreedy, MD    Family History Family History  Problem Relation Age of Onset  . Thyroid disease Mother     Social History Social History   Tobacco Use  . Smoking status: Former Smoker    Types: Cigarettes    Quit date: 06/29/2011    Years since quitting: 8.0  . Smokeless tobacco: Never Used  Substance Use Topics  . Alcohol use: Yes    Comment: rarely   . Drug use: No     Allergies   Patient has no known allergies.   Review of Systems Review of Systems  Constitutional: Negative for activity change, fatigue and fever.  HENT: Positive for congestion, rhinorrhea and sore throat. Negative for ear pain, sinus pressure, sinus pain, tinnitus and voice change.   Respiratory: Positive for cough. Negative for chest tightness, shortness of breath and wheezing.   Gastrointestinal: Positive for abdominal pain. Negative for nausea and vomiting.   Musculoskeletal: Negative for arthralgias and myalgias.     Physical Exam Triage Vital Signs ED Triage Vitals  Enc Vitals Group     BP 07/05/19 0835 121/65     Pulse Rate 07/05/19 0835 98     Resp 07/05/19 0835 18     Temp 07/05/19 0835 98.7 F (37.1 C)     Temp Source 07/05/19 0835 Oral     SpO2 07/05/19 0835 100 %     Weight --      Height --      Head Circumference --      Peak Flow --      Pain Score 07/05/19 0836 8     Pain Loc --      Pain Edu? --      Excl. in [place]? --    No data found.  Updated Vital Signs BP 121/65 (BP Location: Left Arm)   Pulse 98   Temp 98.7 F (37.1 C) (Oral)   Resp 18   SpO2 100%   Visual Acuity Right Eye Distance:   Left Eye Distance:   Bilateral Distance:    Right Eye Near:   Left Eye Near:    Bilateral Near:     Physical Exam Vitals and nursing note reviewed.  Constitutional:      General: She is not in acute distress.    Appearance: She is not ill-appearing or diaphoretic.  HENT:     Right Ear: Tympanic membrane normal.     Left Ear: Tympanic membrane normal.     Nose: No congestion or rhinorrhea.     Mouth/Throat:     Mouth: Mucous membranes are moist.     Pharynx: Uvula midline. No pharyngeal swelling, oropharyngeal exudate or posterior oropharyngeal erythema.     Tonsils: No tonsillar exudate. 1+ on the right. 1+ on the left.  Cardiovascular:     Rate and Rhythm: Normal rate and regular rhythm.     Heart sounds: Normal heart sounds.  Pulmonary:     Effort: Pulmonary effort is normal.     Breath sounds: Normal breath sounds. No wheezing or rhonchi.  Musculoskeletal:     Cervical back: Normal range of motion.  Neurological:     Mental Status: She is alert.      UC Treatments / Results  Labs (all labs ordered are listed, but only abnormal results are displayed) Labs Reviewed  SARS CORONAVIRUS 2 (TAT 6-24 HRS)    EKG   Radiology No results found.  Procedures Procedures (including critical care  time)  Medications Ordered in UC Medications - No data to display  Initial  Impression / Assessment and Plan / UC Course  I have reviewed the triage vital signs and the nursing notes.  Pertinent labs & imaging results that were available during my care of the patient were reviewed by me and considered in my medical decision making (see chart for details).     1.  Allergic conjunctivitis and rhinitis: Fluticasone nasal spray Patanol ophthalmic solution Stop Benadryl use, start Claritin use Return precautions given COVID-19 PCR test has been sent Patient is advised to quarantine until COVID-19 test results are available. Final Clinical Impressions(s) / UC Diagnoses   Final diagnoses:  Allergic conjunctivitis and rhinitis, unspecified laterality   Discharge Instructions   None    ED Prescriptions    Medication Sig Dispense Auth. Provider   fluticasone (FLONASE) 50 MCG/ACT nasal spray Place 1 spray into both nostrils daily. 16 g Thressa Shiffer, Britta Mccreedy, MD   olopatadine (PATANOL) 0.1 % ophthalmic solution Place 1 drop into both eyes 2 (two) times daily. 5 mL Jessie Schrieber, Britta Mccreedy, MD   loratadine (CLARITIN) 10 MG tablet Take 1 tablet (10 mg total) by mouth daily.  Tangelia Sanson, Britta Mccreedy, MD     PDMP not reviewed this encounter.   Merrilee Jansky, MD 07/05/19 [phone]

## 2019-07-06 LAB — SARS CORONAVIRUS 2 (TAT 6-24 HRS): SARS Coronavirus 2: NEGATIVE

## 2020-03-30 ENCOUNTER — Other Ambulatory Visit: Payer: Medicaid Other

## 2020-03-30 DIAGNOSIS — Z20822 Contact with and (suspected) exposure to covid-19: Secondary | ICD-10-CM

## 2020-04-01 LAB — NOVEL CORONAVIRUS, NAA: SARS-CoV-2, NAA: NOT DETECTED

## 2020-04-01 LAB — SARS-COV-2, NAA 2 DAY TAT

## 2020-09-15 ENCOUNTER — Other Ambulatory Visit: Payer: Self-pay | Admitting: Internal Medicine

## 2020-09-16 LAB — COMPLETE METABOLIC PANEL WITH GFR
AG Ratio: 1.3 (calc) (ref 1.0–2.5)
ALT: 15 U/L (ref 6–29)
AST: 17 U/L (ref 10–30)
Albumin: 4.6 g/dL (ref 3.6–5.1)
Alkaline phosphatase (APISO): 52 U/L (ref 31–125)
BUN: 17 mg/dL (ref 7–25)
CO2: 23 mmol/L (ref 20–32)
Calcium: 10.3 mg/dL — ABNORMAL HIGH (ref 8.6–10.2)
Chloride: 103 mmol/L (ref 98–110)
Creat: 0.72 mg/dL (ref 0.50–0.97)
Globulin: 3.5 g/dL (calc) (ref 1.9–3.7)
Glucose, Bld: 93 mg/dL (ref 65–99)
Potassium: 4.2 mmol/L (ref 3.5–5.3)
Sodium: 138 mmol/L (ref 135–146)
Total Bilirubin: 0.6 mg/dL (ref 0.2–1.2)
Total Protein: 8.1 g/dL (ref 6.1–8.1)
eGFR: 111 mL/min/{1.73_m2} (ref >=60)

## 2020-09-16 LAB — CBC
HCT: 48.4 % — ABNORMAL HIGH (ref 35.0–45.0)
Hemoglobin: 15.8 g/dL — ABNORMAL HIGH (ref 11.7–15.5)
MCH: 29.2 pg (ref 27.0–33.0)
MCHC: 32.6 g/dL (ref 32.0–36.0)
MCV: 89.3 fL (ref 80.0–100.0)
MPV: 11.6 fL (ref 7.5–12.5)
Platelets: 304 10*3/uL (ref 140–400)
RBC: 5.42 10*6/uL — ABNORMAL HIGH (ref 3.80–5.10)
RDW: 13.2 % (ref 11.0–15.0)
WBC: 9.2 10*3/uL (ref 3.8–10.8)

## 2020-09-16 LAB — C. TRACHOMATIS/N. GONORRHOEAE RNA
C. trachomatis RNA, TMA: NOT DETECTED
N. gonorrhoeae RNA, TMA: NOT DETECTED

## 2020-09-16 LAB — VITAMIN D 25 HYDROXY (VIT D DEFICIENCY, FRACTURES): Vit D, 25-Hydroxy: 27 ng/mL — ABNORMAL LOW (ref 30–100)

## 2020-09-16 LAB — TSH: TSH: 0.94 mIU/L

## 2020-09-17 LAB — PAP IG W/ RFLX HPV ASCU

## 2022-07-29 ENCOUNTER — Other Ambulatory Visit: Payer: Self-pay | Admitting: Internal Medicine

## 2022-08-02 LAB — LIPID PANEL
Cholesterol: 213 mg/dL — ABNORMAL HIGH (ref <200)
HDL: 52 mg/dL (ref >=50)
LDL Cholesterol (Calc): 136 mg/dL (calc) — ABNORMAL HIGH
Non-HDL Cholesterol (Calc): 161 mg/dL (calc) — ABNORMAL HIGH (ref <130)
Total CHOL/HDL Ratio: 4.1 (calc) (ref <5.0)
Triglycerides: 126 mg/dL (ref <150)

## 2022-08-02 LAB — COMPLETE METABOLIC PANEL WITH GFR
AG Ratio: 1.7 (calc) (ref 1.0–2.5)
ALT: 12 U/L (ref 6–29)
AST: 17 U/L (ref 10–30)
Albumin: 4.6 g/dL (ref 3.6–5.1)
Alkaline phosphatase (APISO): 61 U/L (ref 31–125)
BUN: 24 mg/dL (ref 7–25)
CO2: 21 mmol/L (ref 20–32)
Calcium: 9.5 mg/dL (ref 8.6–10.2)
Chloride: 107 mmol/L (ref 98–110)
Creat: 0.74 mg/dL (ref 0.50–0.97)
Globulin: 2.7 g/dL (calc) (ref 1.9–3.7)
Glucose, Bld: 82 mg/dL (ref 65–99)
Potassium: 4 mmol/L (ref 3.5–5.3)
Sodium: 139 mmol/L (ref 135–146)
Total Bilirubin: 0.7 mg/dL (ref 0.2–1.2)
Total Protein: 7.3 g/dL (ref 6.1–8.1)
eGFR: 106 mL/min/{1.73_m2} (ref >=60)

## 2022-08-02 LAB — CBC
HCT: 46.4 % — ABNORMAL HIGH (ref 35.0–45.0)
Hemoglobin: 14.5 g/dL (ref 11.7–15.5)
MCH: 28.9 pg (ref 27.0–33.0)
MCHC: 31.3 g/dL — ABNORMAL LOW (ref 32.0–36.0)
MCV: 92.6 fL (ref 80.0–100.0)
MPV: 12 fL (ref 7.5–12.5)
Platelets: 296 10*3/uL (ref 140–400)
RBC: 5.01 10*6/uL (ref 3.80–5.10)
RDW: 13.9 % (ref 11.0–15.0)
WBC: 8 10*3/uL (ref 3.8–10.8)

## 2022-08-02 LAB — VITAMIN D 25 HYDROXY (VIT D DEFICIENCY, FRACTURES): Vit D, 25-Hydroxy: 76 ng/mL (ref 30–100)

## 2022-08-02 LAB — TSH: TSH: 1.33 mIU/L

## 2022-09-22 ENCOUNTER — Other Ambulatory Visit (HOSPITAL_COMMUNITY)
Admission: RE | Admit: 2022-09-22 | Discharge: 2022-09-22 | Disposition: A | Discharge status: Home / Self Care | Payer: Medicaid Other | Source: Ambulatory Visit | Attending: Obstetrics and Gynecology | Admitting: Obstetrics and Gynecology

## 2022-09-22 ENCOUNTER — Ambulatory Visit (INDEPENDENT_AMBULATORY_CARE_PROVIDER_SITE_OTHER): Payer: Medicaid Other | Admitting: Obstetrics and Gynecology

## 2022-09-22 ENCOUNTER — Encounter: Payer: Self-pay | Admitting: Obstetrics and Gynecology

## 2022-09-22 VITALS — BP 129/90 | HR 96 | Ht 65.0 in | Wt 192.2 lb

## 2022-09-22 DIAGNOSIS — Z113 Encounter for screening for infections with a predominantly sexual mode of transmission: Secondary | ICD-10-CM | POA: Diagnosis not present

## 2022-09-22 DIAGNOSIS — Z124 Encounter for screening for malignant neoplasm of cervix: Secondary | ICD-10-CM

## 2022-09-22 DIAGNOSIS — Z01419 Encounter for gynecological examination (general) (routine) without abnormal findings: Secondary | ICD-10-CM

## 2022-09-22 DIAGNOSIS — R6882 Decreased libido: Secondary | ICD-10-CM

## 2022-09-22 MED ORDER — BUPROPION HCL ER (XL) 150 MG PO TB24
150.0000 mg | ORAL_TABLET | Freq: Every day | ORAL | 3 refills | Status: DC
Start: 2022-09-22 — End: 2023-05-27

## 2022-09-22 NOTE — Progress Notes (Signed)
ANNUAL EXAM Patient name: Kathy Sandoval MRN [id number]  Date of birth: Jan 01, 1986 Chief Complaint:   New patient   History of Present Illness:   Kathy Sandoval is a 37 y.o. [phone]  female being seen today for a routine annual exam.  Current complaints: low libido, ever since having her daughters reports no desire or need. Reports normal sexual function prior to having twins. No pain, no emotional factors she feels inhibit her from having sex. Denies anxiety or depression symptoms, denies overwhelming stress or other contributing factors.   No LMP recorded. (Menstrual status: IUD).   The pregnancy intention screening data noted above was reviewed. Potential methods of contraception were discussed. The patient elected to proceed with No data recorded.   Last pap 2022. Results were:  normal per patient . H/O abnormal pap: no Last mammogram: n/a. Results were: N/A. Family h/o breast cancer: no Last colonoscopy: n/a. Results were: N/A. Family h/o colorectal cancer: no     11/03/2016   10:58 AM 09/22/2016    9:18 AM 08/04/2016   10:40 AM 07/28/2016    8:47 AM 07/06/2016   10:56 AM  Depression screen PHQ 2/9  Decreased Interest 2 1 1 2 1   Down, Depressed, Hopeless 2 1 0 1 1  PHQ - 2 Score 4 2 1 3 2   Altered sleeping 2 1 1 2 2   Tired, decreased energy 2 1 2 2 2   Change in appetite 2 0 0 0 1  Feeling bad or failure about yourself  1 0 0 0   Trouble concentrating 2 0 0 0 0  Moving slowly or fidgety/restless 0 0 0 1 0  Suicidal thoughts 0 0 0 0 0  PHQ-9 Score 13 4 4 8 7         11/03/2016   10:58 AM 09/22/2016    9:18 AM 08/04/2016   10:40 AM 07/28/2016    8:48 AM  GAD 7 : Generalized Anxiety Score  Nervous, Anxious, on Edge 2 1 1 1   Control/stop worrying 2 0 1 1  Worry too much - different things 2 1 1 1   Trouble relaxing 2 1 1 2   Restless 1 0 0 1  Easily annoyed or irritable 3 1 0 0  Afraid - awful might happen 2 0 0 1  Total GAD 7 Score 14 4 4 7      Review of Systems:    Pertinent items are noted in HPI Denies any headaches, blurred vision, fatigue, shortness of breath, chest pain, abdominal pain, abnormal vaginal discharge/itching/odor/irritation, problems with periods, bowel movements, urination, or intercourse unless otherwise stated above. Pertinent History Reviewed:  Reviewed past medical,surgical, social and family history.  Reviewed problem list, medications and allergies. Physical Assessment:   Vitals:   09/22/22 1026  BP: (!) 129/90  Pulse: 96  Weight: 192 lb 3.2 oz (87.2 kg)  Height: 5\' 5"  (1.651 m)  Body mass index is 31.98 kg/m.        Physical Examination:   General appearance - well appearing, and in no distress  Mental status - alert, oriented to person, place, and time  Psych:  She has a normal mood and affect  Skin - warm and dry, normal color, no suspicious lesions noted  Chest - effort normal, all lung fields clear to auscultation bilaterally  Heart - normal rate and regular rhythm  Neck:  midline trachea, no thyromegaly or nodules  Breasts - breasts appear normal, no suspicious masses, no skin or nipple changes or  axillary nodes  Abdomen - soft, nontender, nondistended, no masses or organomegaly  Pelvic - VULVA: normal appearing vulva with no masses, tenderness or lesions  VAGINA: normal appearing vagina with normal color and discharge, no lesions  CERVIX: normal appearing cervix without discharge or lesions, no CMT  Thin prep pap is done, IUD strings visualized  UTERUS: uterus is felt to be normal size, shape, consistency and nontender   ADNEXA: No adnexal masses or tenderness noted.    Extremities:  No swelling or varicosities noted  Chaperone present for exam  No results found for this or any previous visit (from the past 24 hour(s)).  Assessment & Plan:  1. Routine screening for STI (sexually transmitted infection)  - Hepatitis B Surface AntiGEN - Hepatitis C Antibody - RPR - HIV antibody (with reflex)  2.  Cervical cancer screening  - Cytology - PAP( [facility])  3. Low libido Discussed options/lifestyle modification, would like to try medication and therapy   Discussed use of bupropron and off label use, discussed side effects include black box warning and when to follow up   - Outside medication based therapy -- sex therapy, PFPT, lifestyle changes (exercise, proper sleep, "date nights", addressing prolapse/SUI - [facility] - Couples and sex therapy - [phone] or [email or web address].  - buPROPion (WELLBUTRIN XL) 150 MG 24 hr tablet; Take 1 tablet (150 mg total) by mouth daily.  Dispense: 30 tablet; Refill: 3  4. Well woman exam with routine gynecological exam IUD in place, strings visualized, discussed when to follow up for removal and/or reinsertion    Labs/procedures today:   Mammogram: @ 37yo, or sooner if problems Colonoscopy: @ 37yo, or sooner if problems  Orders Placed This Encounter  Procedures   Hepatitis B Surface AntiGEN   Hepatitis C Antibody   RPR   HIV antibody (with reflex)    Meds:  Meds ordered this encounter  Medications   buPROPion (WELLBUTRIN XL) 150 MG 24 hr tablet    Sig: Take 1 tablet (150 mg total) by mouth daily.    Dispense:  30 tablet    Refill:  3    Follow-up:  Future Appointments  Date Time Provider Department Center  10/27/2022  4:10 PM Sue Lush, FNP [facility] None     Albertine Grates, FNP

## 2022-09-22 NOTE — Progress Notes (Signed)
NGYN presents for AEX. Last PAP 09-15-20. Pt requesting PAP and STD testing. Mirena placed 09/2016. Pt c/o low libido for 3 years

## 2022-09-22 NOTE — Patient Instructions (Signed)
-   Outside medication based therapy -- sex therapy, PFPT, lifestyle changes (exercise, proper sleep, "date nights", addressing prolapse/SUI - [facility] - Couples and sex therapy - [phone] or [email or web address].

## 2022-10-27 ENCOUNTER — Ambulatory Visit: Payer: Medicaid Other | Admitting: Obstetrics and Gynecology

## 2022-11-09 ENCOUNTER — Ambulatory Visit: Payer: Medicaid Other | Admitting: Obstetrics and Gynecology

## 2022-11-30 ENCOUNTER — Ambulatory Visit: Payer: Medicaid Other | Admitting: Obstetrics and Gynecology

## 2023-05-27 ENCOUNTER — Other Ambulatory Visit: Payer: Self-pay | Admitting: Obstetrics and Gynecology

## 2023-05-27 DIAGNOSIS — R6882 Decreased libido: Secondary | ICD-10-CM

## 2023-10-09 NOTE — Progress Notes (Signed)
    GYNECOLOGY OFFICE PROCEDURE NOTE  Kathy Sandoval is a 38 y.o. [phone] here for Liletta  IUD removal and Mirena  insertion. No GYN concerns.  Last pap smear was on 09/22/22 and was normal.  IUD Removal  Patient identified, informed consent performed, consent signed.  Patient was in the dorsal lithotomy position, normal external genitalia was noted.  A speculum was placed in the patient's vagina, normal discharge was noted, no lesions. The cervix was visualized, no lesions, no abnormal discharge.  The strings of the IUD were grasped and pulled using ring forceps. The IUD was removed in its entirety.  No ring and blue strings identified (Liletta ).   Patient tolerated the procedure well.    IUD Insertion Procedure Note Patient identified, informed consent performed, consent signed.   Discussed risks of irregular bleeding, cramping, infection, malpositioning or misplacement of the IUD outside the uterus which may require further procedure such as laparoscopy. Time out was performed.  Urine pregnancy test negative.  Speculum placed in the vagina.  Cervix visualized.  Cleaned with Betadine x 2.  Grasped anteriorly with a single tooth tenaculum.  Uterus sounded to 9 cm.  Mirena  IUD placed per manufacturer's recommendations.  Strings trimmed to 3 cm. Tenaculum was removed, good hemostasis noted.  Patient tolerated procedure well.   Patient was given post-procedure instructions.  She was advised to have backup contraception for one week.  Patient was also asked to check IUD strings periodically and follow up in 4 weeks for IUD check.  [place], PA-C  10/10/23

## 2023-10-10 ENCOUNTER — Ambulatory Visit: Admitting: Physician Assistant

## 2023-10-10 ENCOUNTER — Other Ambulatory Visit: Payer: Self-pay | Admitting: Medical Genetics

## 2023-10-10 VITALS — BP 119/84 | HR 100 | Ht 65.0 in | Wt 200.5 lb

## 2023-10-10 DIAGNOSIS — Z30433 Encounter for removal and reinsertion of intrauterine contraceptive device: Secondary | ICD-10-CM

## 2023-10-10 MED ORDER — LEVONORGESTREL 20 MCG/DAY IU IUD
1.0000 | INTRAUTERINE_SYSTEM | Freq: Once | INTRAUTERINE | Status: AC
Start: 2023-10-10 — End: 2023-10-10
  Administered 2023-10-10 (×2): 1 via INTRAUTERINE

## 2023-10-10 NOTE — Patient Instructions (Signed)
 Mirena  IUD aftercare  Uterine cramping is common after IUD placement. You can help relieve the discomfort  with heating pads, Tylenol  (acetaminophen ), Aspirin or Advil  (ibuprofen ). If your  cramping becomes very painful, please call the clinic.   2. Irregular bleeding and spotting is normal for the first few months after the IUD is placed.  In some cases, women may experience irregular bleeding or spotting for up to six months  after the IUD is placed. This bleeding can be annoying at first but usually will become  lighter with the Mirena  IUD quickly. Call the clinic if your bleeding is excessive and not  getting better.   3. Your period will likely be shorter and lighter with a Mirena  IUD. Approximately 40% of  women will stop having periods altogether with the Mirena  IUD.   4. IUDs do not protect against sexually transmitted infections including the AIDS virus  (HIV), warts (HPV), gonorrhea, Chlamydia, and herpes. Condoms should be used to  decrease the risk sexually transmitted infections. If you think that you have been exposed  to a sexually transmitted infection, please call the clinic.   5. The Mirena  IUD is effective immediately if it was inserted within seven days after the  start of your period. If you have Mirena  inserted at any other time during your menstrual  cycle, use another method of birth control, like condoms for at least 7 days.   6. It is possible for the IUD to come out of the uterus. If it does slip out of place, it is most  likely to happen in the first few months after being put in. To make sure your IUD is in  place, you can feel for the IUD strings between periods. To check for strings, wash your  hands. Then, sit or squat down. Place one finger into your vagina until you feel your  cervix. It will feel hard and rubbery, like the end of your nose. The string ends should be  coming through your cervix. Do not pull on the strings. If the strings feel much longer   than before, if you feel the hard plastic part of the IUD, or if you cannot feel the strings at  all, the IUD may have moved out of place. Please call the clinic and consider using a  back up form of birth control until you are seen.   7. Keep your follow-up appointment for 4-6 weeks after the IUD has been placed.   8. Pregnancy is unlikely after IUD placement, but can happen. If you have early pregnancy  symptoms like nausea and vomiting, breast tenderness, frequent urination or abdominal  pain, you can take a pregnancy test. Please call the clinic if you have any concerns or if  your pregnancy test is positive.   9. The IUD should only be removed by a healthcare provider.  a. The Mirena  IUD should be removed and/or replaced after 8 years.   Warning Signs  Call the clinic if any of the following occurs:   Severe abdominal pain or cramping   Unusual bleeding   Fever or chills   Foul smelling vaginal discharge   Painful intercourse   Positive pregnancy test.

## 2023-10-10 NOTE — Progress Notes (Signed)
 Pt presents for iud replacement. No questions or concerns at this time.

## 2023-11-08 ENCOUNTER — Ambulatory Visit: Admitting: Obstetrics and Gynecology

## 2023-12-10 ENCOUNTER — Other Ambulatory Visit: Payer: Self-pay | Admitting: Medical Genetics

## 2023-12-10 DIAGNOSIS — Z006 Encounter for examination for normal comparison and control in clinical research program: Secondary | ICD-10-CM
